# Patient Record
Sex: Female | Born: 1980 | Race: White | Hispanic: No | Smoking: Current every day smoker
Health system: Southern US, Community
[De-identification: ages and names within clinical notes are randomized; demographics above are authoritative.]

## PROBLEM LIST (undated history)

## (undated) DIAGNOSIS — F319 Bipolar disorder, unspecified: Secondary | ICD-10-CM

---

## 2004-11-12 ENCOUNTER — Emergency Department: Payer: Self-pay | Admitting: General Practice

## 2005-03-13 ENCOUNTER — Emergency Department: Payer: Self-pay | Admitting: Emergency Medicine

## 2005-05-31 ENCOUNTER — Emergency Department: Payer: Self-pay | Admitting: Emergency Medicine

## 2008-01-31 ENCOUNTER — Ambulatory Visit: Payer: Self-pay | Admitting: Family Medicine

## 2008-03-01 ENCOUNTER — Ambulatory Visit: Payer: Self-pay | Admitting: Family Medicine

## 2008-04-24 ENCOUNTER — Ambulatory Visit: Payer: Self-pay | Admitting: Internal Medicine

## 2008-05-31 ENCOUNTER — Ambulatory Visit: Payer: Self-pay | Admitting: Internal Medicine

## 2008-06-02 ENCOUNTER — Emergency Department: Payer: Self-pay | Admitting: Emergency Medicine

## 2008-06-18 DIAGNOSIS — G43909 Migraine, unspecified, not intractable, without status migrainosus: Secondary | ICD-10-CM | POA: Insufficient documentation

## 2009-01-17 ENCOUNTER — Emergency Department: Payer: Self-pay | Admitting: Emergency Medicine

## 2012-10-25 DIAGNOSIS — F17200 Nicotine dependence, unspecified, uncomplicated: Secondary | ICD-10-CM | POA: Diagnosis present

## 2013-12-27 DIAGNOSIS — F329 Major depressive disorder, single episode, unspecified: Secondary | ICD-10-CM | POA: Insufficient documentation

## 2013-12-27 DIAGNOSIS — F32A Depression, unspecified: Secondary | ICD-10-CM | POA: Insufficient documentation

## 2014-01-14 LAB — URINALYSIS, COMPLETE
Bilirubin,UR: NEGATIVE
Glucose,UR: NEGATIVE mg/dL (ref 0–75)
Nitrite: NEGATIVE
Ph: 5 (ref 4.5–8.0)
Protein: NEGATIVE
SPECIFIC GRAVITY: 1.011 (ref 1.003–1.030)
Squamous Epithelial: 37
WBC UR: 7 /HPF (ref 0–5)

## 2014-01-14 LAB — COMPREHENSIVE METABOLIC PANEL
ALBUMIN: 4.2 g/dL (ref 3.4–5.0)
ALK PHOS: 77 U/L
ALT: 31 U/L (ref 12–78)
Anion Gap: 6 — ABNORMAL LOW (ref 7–16)
BUN: 13 mg/dL (ref 7–18)
Bilirubin,Total: 0.5 mg/dL (ref 0.2–1.0)
CALCIUM: 9.3 mg/dL (ref 8.5–10.1)
CHLORIDE: 105 mmol/L (ref 98–107)
Co2: 28 mmol/L (ref 21–32)
Creatinine: 0.76 mg/dL (ref 0.60–1.30)
EGFR (African American): >60
EGFR (Non-African Amer.): >60
GLUCOSE: 114 mg/dL — AB (ref 65–99)
OSMOLALITY: 279 (ref 275–301)
POTASSIUM: 3 mmol/L — AB (ref 3.5–5.1)
SGOT(AST): 13 U/L — ABNORMAL LOW (ref 15–37)
SODIUM: 139 mmol/L (ref 136–145)
Total Protein: 8.3 g/dL — ABNORMAL HIGH (ref 6.4–8.2)

## 2014-01-14 LAB — CBC
HCT: 44.8 % (ref 35.0–47.0)
HGB: 14.8 g/dL (ref 12.0–16.0)
MCH: 28.9 pg (ref 26.0–34.0)
MCHC: 33 g/dL (ref 32.0–36.0)
MCV: 88 fL (ref 80–100)
Platelet: 365 10*3/uL (ref 150–440)
RBC: 5.11 10*6/uL (ref 3.80–5.20)
RDW: 12.2 % (ref 11.5–14.5)
WBC: 11.9 10*3/uL — AB (ref 3.6–11.0)

## 2014-01-14 LAB — ETHANOL
Ethanol %: <0.003 % (ref 0.000–0.080)
Ethanol: <3 mg/dL

## 2014-01-14 LAB — SALICYLATE LEVEL: SALICYLATES, SERUM: 3.7 mg/dL — AB

## 2014-01-14 LAB — ACETAMINOPHEN LEVEL

## 2014-01-15 ENCOUNTER — Inpatient Hospital Stay: Payer: Self-pay | Admitting: Psychiatry

## 2014-01-15 LAB — DRUG SCREEN, URINE
Amphetamines, Ur Screen: NEGATIVE (ref ?–1000)
Barbiturates, Ur Screen: NEGATIVE (ref ?–200)
Benzodiazepine, Ur Scrn: NEGATIVE (ref ?–200)
Cannabinoid 50 Ng, Ur ~~LOC~~: POSITIVE (ref ?–50)
Cocaine Metabolite,Ur ~~LOC~~: NEGATIVE (ref ?–300)
MDMA (ECSTASY) UR SCREEN: NEGATIVE (ref ?–500)
Methadone, Ur Screen: NEGATIVE (ref ?–300)
OPIATE, UR SCREEN: NEGATIVE (ref ?–300)
PHENCYCLIDINE (PCP) UR S: NEGATIVE (ref ?–25)
Tricyclic, Ur Screen: NEGATIVE (ref ?–1000)

## 2014-01-15 LAB — TSH: Thyroid Stimulating Horm: 3.91 u[IU]/mL

## 2014-01-15 LAB — TROPONIN I: Troponin-I: <0.02 ng/mL

## 2015-03-02 NOTE — H&P (Signed)
PATIENT NAME:  Shannon Patel, Shannon Patel MR#:  [id number] DATE OF BIRTH:  [birth year]  DATE OF ADMISSION:  01/15/2014  REFERRING PHYSICIAN: Emergency Room M.D.   ATTENDING PHYSICIAN: Kristine Linea, M.D.   IDENTIFYING DATA: Shannon Patel is a 34 year old female with no past psychiatric history.   CHIEF COMPLAINT: "I'm not crazy."   HISTORY OF PRESENT ILLNESS: Shannon Patel is a very poor historian, psychotic and disorganized and difficult to interview. She was brought to the hospital by her family. Her mother felt that the patient became too paranoid to function. The patient felt that there are strangers coming to the house, was running away from them. This change of behavior has been pretty sudden and the patient has been paranoid for about a week or so. In the hospital, the patient complained mostly of physical complaints, headache, chest pain. She felt that she is physically ill. She reports that she was frightened at home and the family was trying to get her. She was not feeling safe. The patient is not forthcoming at all.  One thing she did tell me was that everything started on October 8th. My understanding is that on that day last year, her fiance committed suicide. The patient was forced to move in with her parents. She is rather tearful telling me that. She is not forthcoming with any details. She appears hyperreligious. She thinks that she had some unusual religious experience lately. She was observed hallucinating, talking to herself and laughing inappropriately. She denies illicit substance use but is positive for cannabinoids. She denies auditory hallucinations on direct questioning.   PAST PSYCHIATRIC HISTORY: None reported. No suicide attempts. No past hospitalizations or treatments.   FAMILY PSYCHIATRIC HISTORY: None reported.   PAST MEDICAL HISTORY: None.   ALLERGIES: No known drug allergies.   MEDICATIONS ON ADMISSION: Amoxicillin 500 mg every 8   hours.   SOCIAL HISTORY: She lives with her  parents. She is currently unemployed. She lost her boyfriend several months ago.   REVIEW OF SYSTEMS: CONSTITUTIONAL: No fevers or chills. No weight changes.  EYES: No double or blurred vision.  ENT: No hearing loss.  RESPIRATORY: No shortness of breath or cough.  CARDIOVASCULAR: No chest pain or orthopnea.  GASTROINTESTINAL: No abdominal pain, nausea, vomiting or diarrhea.  GENITOURINARY: No incontinence or frequency.  ENDOCRINE: No heat or cold intolerance.  LYMPHATIC: No anemia or easy bruising.  INTEGUMENTARY: No acne or rash.  MUSCULOSKELETAL: No muscle or joint pain.  NEUROLOGIC: No tingling or weakness.  PSYCHIATRIC: See history of present illness for details.   PHYSICAL EXAMINATION: VITAL SIGNS: Blood pressure 110/74, pulse 80, respirations 20, temperature 97.8.  GENERAL: This is a slender young female in no acute distress.  HEENT: The pupils are equal, round and reactive to light. Sclerae are anicteric.  NECK: Supple. No thyromegaly.  LUNGS: Clear to auscultation. No dullness to percussion.  HEART: Regular rhythm and rate. No murmurs, rubs or gallops.  ABDOMEN: Soft, nontender, nondistended. Positive bowel sounds.  MUSCULOSKELETAL: Normal muscle strength in all extremities.  SKIN: No rashes or bruises.  LYMPHATIC: No cervical adenopathy.  NEUROLOGIC: Cranial nerves II through XII are intact.   LABORATORY DATA: Chemistries are within normal limits except for a low potassium of 3. Blood alcohol level is zero. LFTs within normal limits. TSH 3.91. Urine tox screen positive for cannabinoids. CBC within normal limits except for white blood count of 11.9. Urinalysis with trace of leukocyte esterase and 7 white cells per field. Serum acetaminophen less than 2. Serum  salicylates 3.7. EKG normal sinus rhythm with sinus arrhythmia, normal EKG.   MENTAL STATUS EXAMINATION ON ADMISSION: The patient is alert and oriented to person, place, time and somewhat to situation. She is pleasant and  polite but marginally cooperative, distrustful. She maintains good eye contact. Her speech is halting. Her mood is fine with irritable affect. Thought process is influenced by her delusions and possibly auditory hallucinations. She is logical with her own logic. She denied thoughts of hurting herself or others. She is clearly paranoid and delusional. She denies hallucinations but was observed attending to internal stimuli and giggling inappropriately. She believes in messages and coincidence. For example, when I walked to her room she felt that this was because she put a folded towel on the window sill. Her cognition is grossly intact but difficult to assess. She seems intelligent with good fund of knowledge but terribly confused. Her insight and judgment are nonexistent.   SUICIDE RISK ASSESSMENT ON ADMISSION: This is a patient with new-onset mental illness. She is at increased risk of suicide, especially as she so far refuses medications.    DIAGNOSES: AXIS I: Major depressive disorder with psychotic features. Rule out bipolar affective disorder, hypomania.  Cannabis abuse.  AXIS II: Deferred.  AXIS III: Deferred.  AXIS IV: New-onset mental illness, recent loss, family conflict, access to care, poor insight.  AXIS V: Global assessment of functioning 25.   PLAN: The patient was admitted to [facility] for safety, stabilization and medication management. She was initially placed on suicide precautions and was closely monitored for any unsafe behaviors. She underwent full psychiatric and risk assessment. She received pharmacotherapy, individual and group psychotherapy, substance abuse counseling and support from therapeutic milieu.  1.  Psychosis. The patient refuses medication. We will offer Invega oral preparation with a plan to switch to [place] S[place] if necessary.  2.  Insomnia. We will offer trazodone for sleep.  3.  Upper respiratory infection.  She  was put on amoxicillin.  4.  Possible urinary tract infection. We will order urine culture.  5.  Disposition: She will be returning to home.     ____________________________ Braulio ConteJolanta B. Jennet MaduroPucilowska, MD jbp:cs D: 01/15/2014 18:23:39 ET T: 01/15/2014 19:04:53 ET JOB#: [id number]  cc: Jolanta B. Jennet MaduroPucilowska, MD, <Dictator> Shari ProwsJOLANTA B PUCILOWSKA MD ELECTRONICALLY SIGNED 01/20/2014 15:18

## 2015-05-27 ENCOUNTER — Emergency Department
Admission: EM | Admit: 2015-05-27 | Discharge: 2015-05-27 | Disposition: A | Discharge status: Home / Self Care | Payer: Medicaid Other | Attending: Emergency Medicine | Admitting: Emergency Medicine

## 2015-05-27 ENCOUNTER — Encounter: Payer: Self-pay | Admitting: Emergency Medicine

## 2015-05-27 DIAGNOSIS — J029 Acute pharyngitis, unspecified: Secondary | ICD-10-CM

## 2015-05-27 DIAGNOSIS — Z720 Tobacco use: Secondary | ICD-10-CM | POA: Diagnosis not present

## 2015-05-27 LAB — POCT RAPID STREP A: Streptococcus, Group A Screen (Direct): NEGATIVE

## 2015-05-27 MED ORDER — PENICILLIN V POTASSIUM 250 MG PO TABS: 250.0000 mg | ORAL_TABLET | Freq: Four times a day (QID) | ORAL | Status: DC

## 2015-05-27 MED ORDER — LIDOCAINE VISCOUS 2 % MT SOLN
15.0000 mL | Freq: Once | OROMUCOSAL | Status: AC
  Administered 2015-05-27: 15 mL via OROMUCOSAL
  Filled 2015-05-27: qty 15

## 2015-05-27 MED ORDER — DEXAMETHASONE SODIUM PHOSPHATE 10 MG/ML IJ SOLN
10.0000 mg | Freq: Once | INTRAMUSCULAR | Status: AC
  Administered 2015-05-27: 10 mg via INTRAMUSCULAR

## 2015-05-27 MED ORDER — DEXAMETHASONE SODIUM PHOSPHATE 10 MG/ML IJ SOLN
10.0000 mg | Freq: Once | INTRAMUSCULAR | Status: DC
  Filled 2015-05-27: qty 1

## 2015-05-27 NOTE — ED Notes (Signed)
Sore throat for couple of days..worse this am

## 2015-05-27 NOTE — Discharge Instructions (Signed)

## 2015-05-27 NOTE — ED Notes (Signed)
Fever , sore throat , left ear pain , swollen lymph nodes to left throat , " it feels like razor blades when I swallow

## 2015-05-27 NOTE — ED Provider Notes (Signed)
[facility] Emergency Department Provider Note  ____________________________________________  Time seen: On arrival  I have reviewed the triage vital signs and the nursing notes.   HISTORY  Chief Complaint Sore Throat    HPI Shannon Patel is a 34 y.o. female who presents with sore throat. This started approximately 3 days ago and has gotten worse. She reports pain when she swallows. No difficulty breathing. She denies fevers. She reports her child had strep throat recently. No rash. No cough or rhinorrhea. She reports the pain is burning and moderate to severe    History reviewed. No pertinent past medical history.  There are no active problems to display for this patient.   History reviewed. No pertinent past surgical history.  No current outpatient prescriptions on file.  Allergies Review of patient's allergies indicates no known allergies.  No family history on file.  Social History History  Substance Use Topics  . Smoking status: Current Every Day Smoker  . Smokeless tobacco: Not on file  . Alcohol Use: Yes    Review of Systems  Constitutional: Negative for fever. Eyes: Negative for discharge ENT: Positive for sore throat Respiratory: No difficult to breathing Genitourinary: Negative for dysuria. Musculoskeletal: Negative for back pain. Skin: Negative for rash. Neurological: Negative for headaches or focal weakness   ____________________________________________   PHYSICAL EXAM:  VITAL SIGNS: ED Triage Vitals  Enc Vitals Group     BP 05/27/15 1205 131/89 mmHg     Pulse Rate 05/27/15 1205 102     Resp 05/27/15 1205 20     Temp 05/27/15 1205 98.3 F (36.8 C)     Temp Source 05/27/15 1205 Oral     SpO2 05/27/15 1205 99 %     Weight 05/27/15 1205 150 lb (68.04 kg)     Height 05/27/15 1205  (1.6 m)     Head Cir --      Peak Flow --      Pain Score 05/27/15 1201 8     Pain Loc --      Pain Edu? --      Excl. in GC?  --      Constitutional: Alert and oriented. Well appearing and in no distress. Eyes: Conjunctivae are normal.  ENT   Head: Normocephalic and atraumatic.   Mouth/Throat: Mucous membranes are moist. Pharynx is erythematous and tonsils are swollen bilaterally. No purulent discharge Cardiovascular: Normal rate, regular rhythm.  Respiratory: Normal respiratory effort without tachypnea nor retractions.  Gastrointestinal: Soft and non-tender in all quadrants. No distention. There is no CVA tenderness. Musculoskeletal: Nontender with normal range of motion in all extremities. Neurologic:  Normal speech and language. No gross focal neurologic deficits are appreciated. Skin:  Skin is warm, dry and intact. No rash noted. Psychiatric: Mood and affect are normal. Patient exhibits appropriate insight and judgment.  ____________________________________________    LABS (pertinent positives/negatives)  Labs Reviewed  CULTURE, GROUP A STREP ([facility] ONLY)  POCT RAPID STREP A    ____________________________________________     ____________________________________________    RADIOLOGY I have personally reviewed any xrays that were ordered on this patient: *None  ____________________________________________   PROCEDURES  Procedure(s) performed: none   ____________________________________________   INITIAL IMPRESSION / ASSESSMENT AND PLAN / ED COURSE  Pertinent labs & imaging results that were available during my care of the patient were reviewed by me and considered in my medical decision making (see chart for details).  Exam is concerning for strep throat. We will treat  with penicillin and give Decadron IM for the discomfort and swelling  ____________________________________________   FINAL CLINICAL IMPRESSION(S) / ED DIAGNOSES  Final diagnoses:  Pharyngitis     Jene Everyobert Glenette Bookwalter, MD 05/27/15 [phone]

## 2015-05-30 LAB — CULTURE, GROUP A STREP (THRC)

## 2015-10-26 ENCOUNTER — Encounter: Payer: Self-pay | Admitting: *Deleted

## 2015-10-26 ENCOUNTER — Emergency Department
Admission: EM | Admit: 2015-10-26 | Discharge: 2015-10-28 | Disposition: A | Discharge status: Other Acute Inpatient Hospital | Payer: Medicaid Other | Attending: Emergency Medicine | Admitting: Emergency Medicine

## 2015-10-26 DIAGNOSIS — F1120 Opioid dependence, uncomplicated: Secondary | ICD-10-CM

## 2015-10-26 DIAGNOSIS — F1520 Other stimulant dependence, uncomplicated: Secondary | ICD-10-CM | POA: Diagnosis not present

## 2015-10-26 DIAGNOSIS — R45851 Suicidal ideations: Secondary | ICD-10-CM | POA: Insufficient documentation

## 2015-10-26 DIAGNOSIS — Z792 Long term (current) use of antibiotics: Secondary | ICD-10-CM | POA: Insufficient documentation

## 2015-10-26 DIAGNOSIS — F17200 Nicotine dependence, unspecified, uncomplicated: Secondary | ICD-10-CM | POA: Insufficient documentation

## 2015-10-26 DIAGNOSIS — F1994 Other psychoactive substance use, unspecified with psychoactive substance-induced mood disorder: Secondary | ICD-10-CM

## 2015-10-26 DIAGNOSIS — R000 Tachycardia, unspecified: Secondary | ICD-10-CM | POA: Insufficient documentation

## 2015-10-26 DIAGNOSIS — F10120 Alcohol abuse with intoxication, uncomplicated: Secondary | ICD-10-CM | POA: Diagnosis not present

## 2015-10-26 DIAGNOSIS — F10920 Alcohol use, unspecified with intoxication, uncomplicated: Secondary | ICD-10-CM

## 2015-10-26 DIAGNOSIS — F1020 Alcohol dependence, uncomplicated: Secondary | ICD-10-CM | POA: Diagnosis not present

## 2015-10-26 DIAGNOSIS — Z3202 Encounter for pregnancy test, result negative: Secondary | ICD-10-CM | POA: Insufficient documentation

## 2015-10-26 HISTORY — DX: Bipolar disorder, unspecified: F31.9

## 2015-10-26 LAB — COMPREHENSIVE METABOLIC PANEL
ALT: 16 U/L (ref 14–54)
AST: 17 U/L (ref 15–41)
Albumin: 4.5 g/dL (ref 3.5–5.0)
Alkaline Phosphatase: 72 U/L (ref 38–126)
Anion gap: 10 (ref 5–15)
BILIRUBIN TOTAL: 0.4 mg/dL (ref 0.3–1.2)
BUN: 11 mg/dL (ref 6–20)
CHLORIDE: 109 mmol/L (ref 101–111)
CO2: 25 mmol/L (ref 22–32)
Calcium: 9.2 mg/dL (ref 8.9–10.3)
Creatinine, Ser: 0.94 mg/dL (ref 0.44–1.00)
GFR calc Af Amer: >60 mL/min (ref >60)
GFR calc non Af Amer: >60 mL/min (ref >60)
GLUCOSE: 132 mg/dL — AB (ref 65–99)
POTASSIUM: 3.4 mmol/L — AB (ref 3.5–5.1)
SODIUM: 144 mmol/L (ref 135–145)
TOTAL PROTEIN: 8.3 g/dL — AB (ref 6.5–8.1)

## 2015-10-26 LAB — TSH: TSH: 2.664 u[IU]/mL (ref 0.350–4.500)

## 2015-10-26 LAB — CBC
HEMATOCRIT: 45.9 % (ref 35.0–47.0)
HEMOGLOBIN: 14.8 g/dL (ref 12.0–16.0)
MCH: 27.4 pg (ref 26.0–34.0)
MCHC: 32.2 g/dL (ref 32.0–36.0)
MCV: 85.1 fL (ref 80.0–100.0)
Platelets: 492 10*3/uL — ABNORMAL HIGH (ref 150–440)
RBC: 5.4 MIL/uL — AB (ref 3.80–5.20)
RDW: 13.9 % (ref 11.5–14.5)
WBC: 11.5 10*3/uL — ABNORMAL HIGH (ref 3.6–11.0)

## 2015-10-26 LAB — URINE DRUG SCREEN, QUALITATIVE (ARMC ONLY)
Amphetamines, Ur Screen: NOT DETECTED
Barbiturates, Ur Screen: NOT DETECTED
Benzodiazepine, Ur Scrn: NOT DETECTED
COCAINE METABOLITE, UR ~~LOC~~: NOT DETECTED
Cannabinoid 50 Ng, Ur ~~LOC~~: NOT DETECTED
MDMA (ECSTASY) UR SCREEN: NOT DETECTED
Methadone Scn, Ur: NOT DETECTED
Opiate, Ur Screen: NOT DETECTED
PHENCYCLIDINE (PCP) UR S: NOT DETECTED
Tricyclic, Ur Screen: NOT DETECTED

## 2015-10-26 LAB — ETHANOL: Alcohol, Ethyl (B): 249 mg/dL — ABNORMAL HIGH (ref <5)

## 2015-10-26 LAB — T4, FREE: Free T4: 1.01 ng/dL (ref 0.61–1.12)

## 2015-10-26 LAB — POCT PREGNANCY, URINE: Preg Test, Ur: NEGATIVE

## 2015-10-26 MED ORDER — ACETAMINOPHEN 500 MG PO TABS
ORAL_TABLET | ORAL | Status: AC
  Administered 2015-10-26: 500 mg via ORAL
  Filled 2015-10-26: qty 2

## 2015-10-26 MED ORDER — LORAZEPAM 2 MG PO TABS: 0.0000 mg | ORAL_TABLET | Freq: Two times a day (BID) | ORAL | Status: DC

## 2015-10-26 MED ORDER — VITAMIN B-1 100 MG PO TABS
ORAL_TABLET | ORAL | Status: AC
  Administered 2015-10-26: 100 mg via ORAL
  Filled 2015-10-26: qty 1

## 2015-10-26 MED ORDER — VITAMIN B-1 100 MG PO TABS
100.0000 mg | ORAL_TABLET | Freq: Every day | ORAL | Status: DC
Start: 2015-10-26 — End: 2015-10-28
  Administered 2015-10-26 – 2015-10-28 (×3): 100 mg via ORAL
  Filled 2015-10-26 (×2): qty 1

## 2015-10-26 MED ORDER — LORAZEPAM 1 MG PO TABS
1.0000 mg | ORAL_TABLET | Freq: Four times a day (QID) | ORAL | Status: DC | PRN
  Filled 2015-10-26: qty 1

## 2015-10-26 MED ORDER — LORAZEPAM 2 MG PO TABS
0.0000 mg | ORAL_TABLET | Freq: Four times a day (QID) | ORAL | Status: DC
  Administered 2015-10-26: 1 mg via ORAL

## 2015-10-26 MED ORDER — IBUPROFEN 600 MG PO TABS
600.0000 mg | ORAL_TABLET | Freq: Once | ORAL | Status: AC
  Administered 2015-10-26: 600 mg via ORAL

## 2015-10-26 MED ORDER — NICOTINE POLACRILEX 2 MG MT GUM
2.0000 mg | CHEWING_GUM | OROMUCOSAL | Status: DC | PRN
  Administered 2015-10-26 – 2015-10-28 (×4): 2 mg via ORAL
  Filled 2015-10-26 (×2): qty 1

## 2015-10-26 MED ORDER — LORAZEPAM 2 MG/ML IJ SOLN: 1.0000 mg | Freq: Four times a day (QID) | INTRAMUSCULAR | Status: DC | PRN

## 2015-10-26 MED ORDER — THIAMINE HCL 100 MG/ML IJ SOLN: 100.0000 mg | Freq: Every day | INTRAMUSCULAR | Status: DC

## 2015-10-26 MED ORDER — FOLIC ACID 1 MG PO TABS
1.0000 mg | ORAL_TABLET | Freq: Every day | ORAL | Status: DC
  Administered 2015-10-27 – 2015-10-28 (×2): 1 mg via ORAL
  Filled 2015-10-26 (×2): qty 1

## 2015-10-26 MED ORDER — ADULT MULTIVITAMIN W/MINERALS CH
1.0000 | ORAL_TABLET | Freq: Every day | ORAL | Status: DC
  Administered 2015-10-28: 1 via ORAL
  Filled 2015-10-26 (×4): qty 1

## 2015-10-26 MED ORDER — IBUPROFEN 600 MG PO TABS
ORAL_TABLET | ORAL | Status: AC
  Administered 2015-10-26: 600 mg via ORAL
  Filled 2015-10-26: qty 1

## 2015-10-26 MED ORDER — IBUPROFEN 800 MG PO TABS
400.0000 mg | ORAL_TABLET | Freq: Three times a day (TID) | ORAL | Status: DC | PRN
  Administered 2015-10-26: 400 mg via ORAL
  Filled 2015-10-26: qty 1

## 2015-10-26 MED ORDER — ONDANSETRON 4 MG PO TBDP
4.0000 mg | ORAL_TABLET | Freq: Once | ORAL | Status: AC
  Administered 2015-10-26: 4 mg via ORAL

## 2015-10-26 MED ORDER — ACETAMINOPHEN 500 MG PO TABS: 1000.0000 mg | ORAL_TABLET | Freq: Once | ORAL | Status: DC

## 2015-10-26 MED ORDER — ACETAMINOPHEN 500 MG PO TABS
500.0000 mg | ORAL_TABLET | Freq: Once | ORAL | Status: AC
  Administered 2015-10-26: 500 mg via ORAL

## 2015-10-26 MED ORDER — NICOTINE 14 MG/24HR TD PT24
14.0000 mg | MEDICATED_PATCH | Freq: Every day | TRANSDERMAL | Status: DC
  Filled 2015-10-26 (×2): qty 1

## 2015-10-26 MED ORDER — NICOTINE POLACRILEX 2 MG MT GUM
CHEWING_GUM | OROMUCOSAL | Status: AC
  Filled 2015-10-26: qty 1

## 2015-10-26 MED ORDER — ACETAMINOPHEN 325 MG PO TABS
650.0000 mg | ORAL_TABLET | Freq: Three times a day (TID) | ORAL | Status: DC | PRN
  Administered 2015-10-27 – 2015-10-28 (×2): 650 mg via ORAL
  Filled 2015-10-26 (×2): qty 2

## 2015-10-26 MED ORDER — ONDANSETRON 4 MG PO TBDP
ORAL_TABLET | ORAL | Status: AC
  Administered 2015-10-26: 4 mg via ORAL
  Filled 2015-10-26: qty 1

## 2015-10-26 NOTE — ED Notes (Signed)
Misty StanleyLisa, EDT made this RN aware of patient headache 10/10 pain. Will make MD aware.

## 2015-10-26 NOTE — ED Notes (Signed)
ED BHU PLACEMENT JUSTIFICATION Is the patient under IVC or is there intent for IVC: Yes.   Is the patient medically cleared: Yes.   Is there vacancy in the ED BHU: Yes.   Is the population mix appropriate for patient: Yes.   Is the patient awaiting placement in inpatient or outpatient setting: No. Has the patient had a psychiatric consult: No. Survey of unit performed for contraband, proper placement and condition of furniture, tampering with fixtures in bathroom, shower, and each patient room: Yes.  ; Findings: None APPEARANCE/BEHAVIOR calm and cooperative NEURO ASSESSMENT Orientation: time, place and person Hallucinations: No.None noted (Hallucinations) Speech: Normal Gait: normal RESPIRATORY ASSESSMENT Respirations equal bilaterally, no cough noted. No respiratory difficulty CARDIOVASCULAR ASSESSMENT regular rate and rhythm, S1, S2 normal, no murmur, click, rub or gallop and Skin warm dry intact regular rate and rhythm. GASTROINTESTINAL ASSESSMENT No gi distres. denies vomiting or nausea since receiving Zofran EXTREMITIES normal strength, tone, and muscle mass PLAN OF CARE Provide calm/safe environment. Vital signs assessed twice daily. ED BHU Assessment once each 12-hour shift. Collaborate with intake RN daily or as condition indicates. Assure the ED provider has rounded once each shift. Provide and encourage hygiene. Provide redirection as needed. Assess for escalating behavior; address immediately and inform ED provider.  Assess family dynamic and appropriateness for visitation as needed: Yes.  ; If necessary, describe findings: No known family dynamics Educate the patient/family about BHU procedures/visitation: Yes.  ; If necessary, describe findings:

## 2015-10-26 NOTE — ED Provider Notes (Signed)
[facility] Emergency Department Provider Note  ____________________________________________  Time seen: Approximately 211 AM  I have reviewed the triage vital signs and the nursing notes.   HISTORY  Chief Complaint Alcohol Intoxication    HPI Shannon Patel is a 34 y.o. female who was brought into the hospital today with suicidal ideation. The patient is sleepy not cooperating with arousal but according to the patient's family the patient got into an argument with someone about the fact that she did not have her children. The patient started drinking and made statements about wanting to kill herself if she could not get her children back. The patient's family member to her that she would never get her children back. The patient has a history of substance abuse and has lost her children to the state due to her substance abuse.The patient has been unable to get her children back due to continued substance abuse. The patient denies suicidal ideation but made statements previously about wanting to kill herself because she did not have her children. There is also concern the patient may have taken methamphetamine. He has no complaints at this time.   Past Medical History  Diagnosis Date  . Bipolar 1 disorder (HCC)     There are no active problems to display for this patient.   History reviewed. No pertinent past surgical history.  Current Outpatient Rx  Name  Route  Sig  Dispense  Refill  . penicillin v potassium (VEETID) 250 MG tablet   Oral   Take 1 tablet (250 mg total) by mouth 4 (four) times daily.   28 tablet   0     Allergies Review of patient's allergies indicates no known allergies.  History reviewed. No pertinent family history.  Social History Social History  Substance Use Topics  . Smoking status: Current Every Day Smoker  . Smokeless tobacco: None  . Alcohol Use: Yes    Review of Systems Constitutional: No fever/chills Eyes: No  visual changes. ENT: No sore throat. Cardiovascular: Denies chest pain. Respiratory: Denies shortness of breath. Gastrointestinal: No abdominal pain.  No nausea, no vomiting.  No diarrhea.  No constipation. Genitourinary: Negative for dysuria. Musculoskeletal: Negative for back pain. Skin: Negative for rash. Neurological: Negative for headaches, focal weakness or numbness. Psychiatric: Suicidal ideation and substance abuse  10-point ROS otherwise negative.  ____________________________________________   PHYSICAL EXAM:  VITAL SIGNS: ED Triage Vitals  Enc Vitals Group     BP 10/26/15 0013 134/84 mmHg     Pulse Rate 10/26/15 0013 130     Resp 10/26/15 0013 16     Temp --      Temp src --      SpO2 10/26/15 0013 98 %     Weight 10/26/15 0013 153 lb (69.4 kg)     Height 10/26/15 0013 5\' 3"  (1.6 m)     Head Cir --      Peak Flow --      Pain Score 10/26/15 0717 Asleep     Pain Loc --      Pain Edu? --      Excl. in GC? --     Constitutional: Somnolent, Well appearing and in no acute distress. Eyes: Conjunctivae are normal. PERRL. EOMI. Head: Atraumatic. Nose: No congestion/rhinnorhea. Mouth/Throat: Mucous membranes are moist.  Oropharynx non-erythematous. Cardiovascular: Tachycardia, regular rhythm. Grossly normal heart sounds.  Good peripheral circulation. Respiratory: Normal respiratory effort.  No retractions. Lungs CTAB. Gastrointestinal: Soft and nontender. No distention. Positive bowel  sounds Musculoskeletal: No lower extremity tenderness nor edema.  . Neurologic:  Normal speech and language. No gross focal neurologic deficits are appreciated. No gait instability. Skin:  Skin is warm, dry and intact.  Psychiatric: The patient was very agitated when she arrived in the hospital but was somnolent afterwards and sleeping  ____________________________________________   LABS (all labs ordered are listed, but only abnormal results are displayed)  Labs Reviewed   COMPREHENSIVE METABOLIC PANEL - Abnormal; Notable for the following:    Potassium 3.4 (*)    Glucose, Bld 132 (*)    Total Protein 8.3 (*)    All other components within normal limits  ETHANOL - Abnormal; Notable for the following:    Alcohol, Ethyl (B) 249 (*)    All other components within normal limits  CBC - Abnormal; Notable for the following:    WBC 11.5 (*)    RBC 5.40 (*)    Platelets 492 (*)    All other components within normal limits  URINE DRUG SCREEN, QUALITATIVE ([facility] ONLY)  POC URINE PREG, ED  POCT PREGNANCY, URINE   ____________________________________________  EKG  None ____________________________________________  RADIOLOGY  None ____________________________________________   PROCEDURES  Procedure(s) performed: None  Critical Care performed: No  ____________________________________________   INITIAL IMPRESSION / ASSESSMENT AND PLAN / ED COURSE  Pertinent labs & imaging results that were available during my care of the patient were reviewed by me and considered in my medical decision making (see chart for details).  This is a 34 year old female who was brought in with suicidal ideation and substance abuse. The patient has stressors in life that would cause her to feel suicidal. We'll have the patient evaluated by TTS as well as psych. The patient has been drinking so she is sleeping currently but we will await her arousal and then have her evaluated by TTS. ____________________________________________   FINAL CLINICAL IMPRESSION(S) / ED DIAGNOSES  Final diagnoses:  Suicidal ideation  Alcohol intoxication, uncomplicated (HCC)      Rebecka Apley, MD 10/26/15 [phone]

## 2015-10-26 NOTE — ED Notes (Signed)
Report received from Felicia RN.  

## 2015-10-26 NOTE — ED Notes (Signed)
Patient's multivitamin was not readily available when patient's other medications were administered and she asked that if she was able to go to sleep, we not wake her for the multivitamin.  Patient can be given the multivitamin in the am when she wakes up.

## 2015-10-26 NOTE — Consult Note (Signed)
[facility] Consult   Reason for Consult: Suicidal ideation Referring Physician: Loney Hering, MD  Patient Identification: Shannon Patel MRN:  [id number] Principal Diagnosis: <principal problem not specified> Diagnosis:  There are no active problems to display for this patient.   Total Time spent with patient: 1.5 hours  Subjective:   Shannon Patel is a 34 y.o. female patient who presents to [facility] with suicidal ideation after getting into an argument with someone about not having her children.  The pt made statements about wanting to kill herself if she was unable to have her children returned to her.  Patient has a h/o substance abuse and lost her children as a result. Pt denied SI to Dr. Dahlia Client but made statements previously about wanting to kill herself because she did not have her children.  Nursing notes reviewed. Patient presented to the ED after drinking a 5th of Fireball. Pt denies taking meth. Pt's mother called police after the pt left her home and returned 1 hour later behaving differently (cussing and crying, reporting she would do whatever it took to get her daughter back. Pts daughter is with social services). Pt's mood improved while here. This morning reporting to be in a good mood. Pt was pleasant and cooperative.   [facility] Assessment counselor notes shared the pt is tearful, hopeless, guilty, irritability, and despondent. Patient reports an unwillingness to live without her children  Today on interview, the pt shared, "I drank alcohol last night and being stupid." Pt discussed her most recent stressors as noted above. Pt discussed no longer having her children with her as they are in foster care due to her drug use. The pt is permitted to have contact with her 2 sons (aged 20 and 20 years) but recently she is unable to have contact with her 61yo daughter. Her top fear is her daughter will be "adopted away."   The pt reviewed how she will be able to get her  children back which includes 1 year of rehab and following the other procedures/processes. Her main addiction is to pills including Vicodin, percocets, tramadol and methadone. She started using pills about 8 years ago. She has been in rehab before where she was successful with suboxone use. She did not use pills from May 2016 until September 2016. Intermittently, she has used methamphetamine between May and November 2016 (last use was in November 2016). She states she wants to stop using all substances so she can get her kids back. She denies cravings for drugs but shared she had decreased motivation.  She endorses feeling depressed with sleep, guilt, decreased energy, decreased appetite and psychomotor retardation. She endorses feeling hopeless, helpless and worthless. She denies a h/o mania and she current denies SI, HI and AVH.   She also notes she started fluoxetine 40m po Qam this past Tuesday prescribed by her PCP. The pt is willing to restart this medication. She denies medication side effects. Pt endorses a possible h/o tremors but denies a h/o seizures due to alcohol withdrawal.   Past Psychiatric History: Dx: depression, anxiety Psychiatrist: Denies but [facility] Therapist: Denies but [facility] Hospitalizations: Yes, related to substance abuse treatment.  ECT: Denies Suicide attempt/Self-harm: Denies Homicide attempts/harming others: Denies Previous Medication Trials: fluoxetine   Risk to Self: Suicidal Ideation: No-Not Currently/Within Last 6 Months Suicidal Intent: No-Not Currently/Within Last 6 Months Is patient at risk  for suicide?: No Suicidal Plan?: No-Not Currently/Within Last 6 Months Access to Means: No What has been your use of drugs/alcohol within the last 12 months?: alcohol How many times?: 0 Triggers for Past Attempts: Family contact, Other personal contacts Intentional Self  Injurious Behavior: None Risk to Others: Homicidal Ideation: No-Not Currently/Within Last 6 Months Thoughts of Harm to Others: No-Not Currently Present/Within Last 6 Months Current Homicidal Intent: No-Not Currently/Within Last 6 Months Current Homicidal Plan: No-Not Currently/Within Last 6 Months Access to Homicidal Means: No History of harm to others?: No Assessment of Violence: None Noted Does patient have access to weapons?: No Criminal Charges Pending?: No Does patient have a court date: No Prior Inpatient Therapy: Prior Inpatient Therapy: Yes Prior Therapy Dates: unknown Prior Therapy Facilty/Provider(s): [facility] Reason for Treatment: MH/SA Prior Outpatient Therapy: Prior Outpatient Therapy: Yes Prior Therapy Dates: unknown Prior Therapy Facilty/Provider(s): [facility] Reason for Treatment: SA Does patient have an ACCT team?: No Does patient have Intensive In-House Services?  : No Does patient have [facility] services? : No Does patient have P4CC services?: No  Past Medical History:  Past Medical History  Diagnosis Date  . Bipolar 1 disorder ([place])    History reviewed. No pertinent past surgical history. Family History: History reviewed. No pertinent family history.  Family Psychiatric  History:  Depression: mother Anxiety: Denies Bipolar disorder: mother??? Schizophrenia: Denies Panic disorder: Denies  Substance abuse: Bother has been in rehab now for several years.  Alcohol: Father, died of cirrhosis of the liver.   Suicide: Denies    Social History:  History  Alcohol Use  . Yes     History  Drug Use Not on file    Social History   Social History  . Marital Status: Married    Spouse Name: N/A  . Number of Children: N/A  . Years of Education: N/A   Social History Main Topics  . Smoking status: Current Every Day Smoker  . Smokeless tobacco: None  . Alcohol Use: Yes  . Drug Use: None  . Sexual Activity: Not Asked   Other Topics Concern  . None    Social History Narrative   Additional Social History:    Pain Medications: see chart Prescriptions: see chart Over the Counter: see chart History of alcohol / drug use?: Yes Longest period of sobriety (when/how long): 3 months Negative Consequences of Use: Museum/gallery curator, Scientist, research (physical sciences), Personal relationships, Work / School Withdrawal Symptoms:  (denies) Name of Substance 1: alcohol 1 - Age of First Use: 16 1 - Amount (size/oz): varies 1 - Frequency: varies 1 - Last Use / Amount: 10/25/2015   Allergies:  No Known Allergies  Labs:  Results for orders placed or performed during the hospital encounter of 10/26/15 (from the past 48 hour(s))  Comprehensive metabolic panel     Status: Abnormal   Collection Time: 10/26/15 12:19 AM  Result Value Ref Range   Sodium 144 135 - 145 mmol/L   Potassium 3.4 (L) 3.5 - 5.1 mmol/L   Chloride 109 101 - 111 mmol/L   CO2 25 22 - 32 mmol/L   Glucose, Bld 132 (H) 65 - 99 mg/dL   BUN 11 6 - 20 mg/dL   Creatinine, Ser 0.94 0.44 - 1.00 mg/dL   Calcium 9.2 8.9 - 10.3 mg/dL   Total Protein 8.3 (H) 6.5 - 8.1 g/dL   Albumin 4.5 3.5 - 5.0 g/dL   AST 17 15 - 41 U/L   ALT 16 14 - 54 U/L   Alkaline  Phosphatase 72 38 - 126 U/L   Total Bilirubin 0.4 0.3 - 1.2 mg/dL   GFR calc non Af Amer >60 >60 mL/min   GFR calc Af Amer >60 >60 mL/min    Comment: (NOTE) The eGFR has been calculated using the CKD EPI equation. This calculation has not been validated in all clinical situations. eGFR's persistently <60 mL/min signify possible Chronic Kidney Disease.    Anion gap 10 5 - 15  Ethanol (ETOH)     Status: Abnormal   Collection Time: 10/26/15 12:19 AM  Result Value Ref Range   Alcohol, Ethyl (B) 249 (H) <5 mg/dL    Comment:        LOWEST DETECTABLE LIMIT FOR SERUM ALCOHOL IS 5 mg/dL FOR MEDICAL PURPOSES ONLY   CBC     Status: Abnormal   Collection Time: 10/26/15 12:19 AM  Result Value Ref Range   WBC 11.5 (H) 3.6 - 11.0 K/uL   RBC 5.40 (H) 3.80 - 5.20 MIL/uL    Hemoglobin 14.8 12.0 - 16.0 g/dL   HCT 45.9 35.0 - 47.0 %   MCV 85.1 80.0 - 100.0 fL   MCH 27.4 26.0 - 34.0 pg   MCHC 32.2 32.0 - 36.0 g/dL   RDW 13.9 11.5 - 14.5 %   Platelets 492 (H) 150 - 440 K/uL  Urine Drug Screen, Qualitative ([facility] only)     Status: None   Collection Time: 10/26/15 12:55 AM  Result Value Ref Range   Tricyclic, Ur Screen NONE DETECTED NONE DETECTED   Amphetamines, Ur Screen NONE DETECTED NONE DETECTED   MDMA (Ecstasy)Ur Screen NONE DETECTED NONE DETECTED   Cocaine Metabolite,Ur [place] NONE DETECTED NONE DETECTED   Opiate, Ur Screen NONE DETECTED NONE DETECTED   Phencyclidine (PCP) Ur S NONE DETECTED NONE DETECTED   Cannabinoid 50 Ng, Ur [place] NONE DETECTED NONE DETECTED   Barbiturates, Ur Screen NONE DETECTED NONE DETECTED   Benzodiazepine, Ur Scrn NONE DETECTED NONE DETECTED   Methadone Scn, Ur NONE DETECTED NONE DETECTED    Comment: (NOTE) 469  Tricyclics, urine               Cutoff 1000 ng/mL 200  Amphetamines, urine             Cutoff 1000 ng/mL 300  MDMA (Ecstasy), urine           Cutoff 500 ng/mL 400  Cocaine Metabolite, urine       Cutoff 300 ng/mL 500  Opiate, urine                   Cutoff 300 ng/mL 600  Phencyclidine (PCP), urine      Cutoff 25 ng/mL 700  Cannabinoid, urine              Cutoff 50 ng/mL 800  Barbiturates, urine             Cutoff 200 ng/mL 900  Benzodiazepine, urine           Cutoff 200 ng/mL 1000 Methadone, urine                Cutoff 300 ng/mL 1100 1200 The urine drug screen provides only a preliminary, unconfirmed 1300 analytical test result and should not be used for non-medical 1400 purposes. Clinical consideration and professional judgment should 1500 be applied to any positive drug screen result due to possible 1600 interfering substances. A more specific alternate chemical method 1700 must be used in order to obtain a confirmed analytical  result.  1800 Gas chromato graphy / mass spectrometry (GC/MS) is the preferred 1900  confirmatory method.   Pregnancy, urine POC     Status: None   Collection Time: 10/26/15  3:49 AM  Result Value Ref Range   Preg Test, Ur NEGATIVE NEGATIVE    Comment:        THE SENSITIVITY OF THIS METHODOLOGY IS >24 mIU/mL     No current facility-administered medications for this encounter.   Current Outpatient Prescriptions  Medication Sig Dispense Refill  . penicillin v potassium (VEETID) 250 MG tablet Take 1 tablet (250 mg total) by mouth 4 (four) times daily. 28 tablet 0    Musculoskeletal: Strength & Muscle Tone: within normal limits Gait & Station: normal Patient leans: N/A  Psychiatric Specialty Exam: Review of Systems  Psychiatric/Behavioral: Positive for depression and substance abuse. Negative for suicidal ideas, hallucinations and memory loss. The patient has insomnia. The patient is not nervous/anxious.   All other systems reviewed and are negative.   Blood pressure 105/54, pulse 76, resp. rate 18, height 5' 3"  (1.6 m), weight 69.4 kg (153 lb), SpO2 98 %.Body mass index is 27.11 kg/(m^2).  General Appearance: Fairly Groomed  Engineer, water::  Good  Speech:  Clear and Coherent and Normal Rate  Volume:  Normal  Mood:  Dysphoric  Affect:  Depressed  Thought Process:  Coherent, Goal Directed, Intact, Linear and Logical  Orientation:  Full (Time, Place, and Person)  Thought Content:  Negative  Suicidal Thoughts:  No  Homicidal Thoughts:  No  Memory:  Immediate;   Good Recent;   Good Remote;   Good  Judgement:  Poor  Insight:  Fair  Psychomotor Activity:  Normal  Concentration:  Good  Recall:  Good  Fund of Knowledge:Good  Language: Good  Akathisia:  Negative  Handed:  Right  AIMS (if indicated):     Assets:  Communication Skills Desire for Improvement Housing Physical Health Resilience Social Support  ADL's:  Intact  Cognition: WNL  Sleep:      Treatment Plan Summary: Medication management  Disposition: Recommend psychiatric Inpatient admission  when medically cleared.   1. Continue IVC and seek inpatient placement.  2. Will restart fluoxetine 45m po Qam for depression.  3. Start nicotine patch 125mper 24 hours and nicotine gum for nicotine withdrawal.  4. R/B/Se discussed including but not limited to any and black box warnings, mood derangements, GI upset, insomnia.  5. Will check TSH and FT4.  6. Will start CIWA for alcohol withdrawal as a primary preventive measure.    McDonita Brooks2/17/2016 12:15 PM

## 2015-10-26 NOTE — ED Notes (Signed)
Denies any SI/HI at this time. Patient states I am in a good mind right now, I've just had a lot of things going on.  Pt pleasant and cooperative.  Ate some breakfast. Given crackers and sprite.  Explained to patient she would be moving to [facility] at some point and is waiting for psychiatrist to evaluate.

## 2015-10-26 NOTE — ED Notes (Signed)

## 2015-10-26 NOTE — ED Notes (Signed)
Meal tray given 

## 2015-10-26 NOTE — BH Assessment (Signed)
Assessment Note  Shannon Patel is an 34 y.o. female. Patient was brought into the ED because of intoxication and threats of suicide.  According to previous notes the patient made threats to kill herself if she is unable to get her children back from DSS. Patient currently denies SI/HI, hallucinations, and other self injurious behaviors. Patient is tearful, hopeless, guilty, irritability, and despondent.  Patient reports an unwillingness to live without her children.  Patient reports that the case worker with DSS has recommended long-term treatment as an options for her children to return to her custody but the patient doubtful about getting all her children back.    Patient reports drinking a fifth of fireball whiskey prior to this ED admission.  Patient reports the longest abstinence is 3 months.    Disposition pending.    Diagnosis: Substance Induced Mood disorder, Alcohol use, moderate  Past Medical History:  Past Medical History  Diagnosis Date  . Bipolar 1 disorder ([facility])     History reviewed. No pertinent past surgical history.  Family History: History reviewed. No pertinent family history.  Social History:  reports that she has been smoking.  She does not have any smokeless tobacco history on file. She reports that she drinks alcohol. Her drug history is not on file.  Additional Social History:  Alcohol / Drug Use Pain Medications: see chart Prescriptions: see chart Over the Counter: see chart History of alcohol / drug use?: Yes Longest period of sobriety (when/how long): 3 months Negative Consequences of Use: Financial, Legal, Personal relationships, Work / School Withdrawal Symptoms:  (denies) Substance #1 Name of Substance 1: alcohol 1 - Age of First Use: 16 1 - Amount (size/oz): varies 1 - Frequency: varies 1 - Last Use / Amount: 10/25/2015  CIWA: CIWA-Ar BP: (!) 105/54 mmHg Pulse Rate: 76 Nausea and Vomiting: no nausea and no vomiting Tactile Disturbances:  none Tremor: no tremor Auditory Disturbances: not present Paroxysmal Sweats: no sweat visible Visual Disturbances: not present Anxiety: no anxiety, at ease Headache, Fullness in Head: none present Agitation: normal activity Orientation and Clouding of Sensorium: oriented and can do serial additions CIWA-Ar Total: 0 COWS:    Allergies: No Known Allergies  Home Medications:  (Not in a hospital admission)  OB/GYN Status:  No LMP recorded.  General Assessment Data [facility] Assessment: In system Is this a Tele or Face-to-Face Assessment?: Face-to-Face Is this an Initial Assessment or a Re-assessment for this encounter?: Initial Assessment Marital status: Single Maiden name: Dixion Is patient pregnant?: No Pregnancy Status: No Living Arrangements: Parent Can pt return to current living arrangement?: Yes Admission Status: Involuntary Is patient capable of signing voluntary admission?: No Referral Source: Self/Family/Friend  Medical Screening Exam [facility] ONLY) Medical Exam completed: Yes  Crisis Care Plan Living Arrangements: Parent Name of Psychiatrist: [facility] Name of Therapist: [facility]  Education Status Is patient currently in school?: No Current Grade: na Highest grade of school patient has completed: 10 Name of school: na Contact person: na  Risk to self with the past 6 months Suicidal Ideation: No-Not Currently/Within Last 6 Months Has patient been a risk to self within the past 6 months prior to admission? : Yes Suicidal Intent: No-Not Currently/Within Last 6 Months Has patient had any suicidal intent within the past 6 months prior to admission? : No Is patient at risk for suicide?: No Suicidal Plan?: No-Not Currently/Within Last 6 Months Has patient had any suicidal plan within the past 6 months prior  to admission? : No Access to Means: No What has been your use of drugs/alcohol within the last 12 months?: alcohol Previous  Attempts/Gestures: No How many times?: 0 Triggers for Past Attempts: Family contact, Other personal contacts Intentional Self Injurious Behavior: None Family Suicide History: No Recent stressful life event(s): Conflict (Comment), Loss (Comment), Financial Problems, Trauma (Comment) Persecutory voices/beliefs?: No Depression: Yes Depression Symptoms: Tearfulness, Guilt, Loss of interest in usual pleasures, Feeling worthless/self pity, Feeling angry/irritable (hopelessness) Substance abuse history and/or treatment for substance abuse?: Yes  Risk to Others within the past 6 months Homicidal Ideation: No-Not Currently/Within Last 6 Months Does patient have any lifetime risk of violence toward others beyond the six months prior to admission? : No Thoughts of Harm to Others: No-Not Currently Present/Within Last 6 Months Current Homicidal Intent: No-Not Currently/Within Last 6 Months Current Homicidal Plan: No-Not Currently/Within Last 6 Months Access to Homicidal Means: No History of harm to others?: No Assessment of Violence: None Noted Does patient have access to weapons?: No Criminal Charges Pending?: No Does patient have a court date: No Is patient on probation?: No  Psychosis Hallucinations: None noted Delusions: None noted  Mental Status Report Appearance/Hygiene: In hospital gown Eye Contact: Fair Motor Activity: Freedom of movement Speech: Logical/coherent Level of Consciousness: Alert Mood: Depressed Affect: Depressed Anxiety Level: Minimal Thought Processes: Relevant Judgement: Unimpaired Orientation: Person, Place, Time, Situation Obsessive Compulsive Thoughts/Behaviors: None  Cognitive Functioning Concentration: Fair Memory: Recent Intact, Remote Intact IQ: Average Insight: Fair Impulse Control: Fair Appetite: Fair Weight Loss: 0 Weight Gain: 0 Sleep: Decreased Total Hours of Sleep: 5 Vegetative Symptoms: None  ADLScreening [facility] Assessment  Services) Patient's cognitive ability adequate to safely complete daily activities?: Yes Patient able to express need for assistance with ADLs?: Yes Independently performs ADLs?: Yes (appropriate for developmental age)  Prior Inpatient Therapy Prior Inpatient Therapy: Yes Prior Therapy Dates: unknown Prior Therapy Facilty/Provider(s): [facility] Reason for Treatment: MH/SA  Prior Outpatient Therapy Prior Outpatient Therapy: Yes Prior Therapy Dates: unknown Prior Therapy Facilty/Provider(s): [facility] Reason for Treatment: SA Does patient have an ACCT team?: No Does patient have Intensive In-House Services?  : No Does patient have [facility] services? : No Does patient have P4CC services?: No  ADL Screening (condition at time of admission) Patient's cognitive ability adequate to safely complete daily activities?: Yes Patient able to express need for assistance with ADLs?: Yes Independently performs ADLs?: Yes (appropriate for developmental age)       Abuse/Neglect Assessment (Assessment to be complete while patient is alone) Physical Abuse: Denies Verbal Abuse: Denies Sexual Abuse: Denies Exploitation of patient/patient's resources: Denies Self-Neglect: Denies Values / Beliefs Cultural Requests During Hospitalization: None Spiritual Requests During Hospitalization: None Consults Spiritual Care Consult Needed: No Social Work Consult Needed: No Merchant navy officer (For Healthcare) Does patient have an advance directive?: No Would patient like information on creating an advanced directive?: No - patient declined information    Additional Information 1:1 In Past 12 Months?: No CIRT Risk: No Elopement Risk: No Does patient have medical clearance?: Yes     Disposition:  Disposition Initial Assessment Completed for this Encounter: Yes Disposition of Patient: Other dispositions (Pending) Other disposition(s): Other (Comment) (Pending)  On Site Evaluation by:   Reviewed  with Physician:    Maryelizabeth Rowan A 10/26/2015 11:12 AM

## 2015-10-26 NOTE — ED Notes (Addendum)
Pt arrived to Ed with sheriff after drinking a reported 5th of Fireball. Sheriff reports pt could have taken meth but pt denies. Pts mother called police after pt left house and 1 hour later returned acting differently (cussing and crying, reporting she would do whatever it took to get her daughter back. Pts daughter is with social services). Pt denies SI/HI.

## 2015-10-26 NOTE — ED Notes (Signed)
Patient tolerated medication administration well. MD made aware. No new orders.

## 2015-10-26 NOTE — ED Notes (Signed)
Pt given lunch tray.

## 2015-10-26 NOTE — ED Notes (Signed)
Pt resting quietly in bed at this time.

## 2015-10-26 NOTE — ED Notes (Signed)
Received new orders from MD for nausea/vomiting sx. Will complete accordingly.

## 2015-10-26 NOTE — ED Notes (Addendum)
Pt has 5 silver colored rings, 1 silver colored bracelet, 1 pair silver colored cross earrings, and a hair tie placed in a clear cup, and placed in pt belonging bag, given to [employer] nurse for lockup.

## 2015-10-26 NOTE — ED Notes (Signed)
Patient noted actively vomiting at this time. Will make MD aware.

## 2015-10-26 NOTE — ED Notes (Addendum)
Pt alert, appropriate, cooperative. Up, eating meal. Will continue to monitor patient.

## 2015-10-26 NOTE — ED Notes (Signed)
Pt woke up long enough for vital signs to be taken and went back to sleep. Denies any needs at this time.

## 2015-10-26 NOTE — ED Notes (Signed)
Pt showered. Pt informed that normally showers are done in the morning but because patient arrived late was told she could shower.

## 2015-10-26 NOTE — ED Notes (Signed)
Dr. Jenne CampusMcqueen at bedside.

## 2015-10-26 NOTE — ED Notes (Signed)
Spoke with MD, new verbal orders received. Will complete accordingly.

## 2015-10-27 DIAGNOSIS — F1994 Other psychoactive substance use, unspecified with psychoactive substance-induced mood disorder: Secondary | ICD-10-CM | POA: Diagnosis not present

## 2015-10-27 DIAGNOSIS — F1020 Alcohol dependence, uncomplicated: Secondary | ICD-10-CM | POA: Diagnosis not present

## 2015-10-27 DIAGNOSIS — F1120 Opioid dependence, uncomplicated: Secondary | ICD-10-CM | POA: Diagnosis not present

## 2015-10-27 DIAGNOSIS — F1520 Other stimulant dependence, uncomplicated: Secondary | ICD-10-CM | POA: Diagnosis not present

## 2015-10-27 MED ORDER — FLUOXETINE HCL 20 MG PO CAPS
20.0000 mg | ORAL_CAPSULE | Freq: Every day | ORAL | Status: DC
  Administered 2015-10-27 – 2015-10-28 (×2): 20 mg via ORAL
  Filled 2015-10-27: qty 1

## 2015-10-27 MED ORDER — TRAZODONE HCL 50 MG PO TABS
50.0000 mg | ORAL_TABLET | Freq: Every evening | ORAL | Status: DC | PRN
  Administered 2015-10-27: 50 mg via ORAL
  Filled 2015-10-27: qty 1

## 2015-10-27 MED ORDER — FLUOXETINE HCL 20 MG PO CAPS
ORAL_CAPSULE | ORAL | Status: AC
  Filled 2015-10-27: qty 1

## 2015-10-27 NOTE — Progress Notes (Signed)
[facility] . Accepting physician is Dr. Dub MikesLugo.  Call report to [phone].  Representative was [employer].  ER Staff is aware of it (ER Sect.; Dr. Inocencio HomesGayle, ER MD & Ruthie Patient's Nurse)     Please transport after 7:30 pm   10/27/2015 Cheryl FlashNicole Ravan Schlemmer, MS, NCC, LPCA Therapeutic Triage Specialist

## 2015-10-27 NOTE — [facility] Counselor (Signed)
Per Dr. McQueen, patient meets criteria for inpatient hospitalization. [facility] reports no beds available at the moment. Will seek placement options with [facility], [facility], [facility], [facility], [facility] and [facility] 

## 2015-10-27 NOTE — ED Provider Notes (Signed)
-----------------------------------------   7:23 AM on 10/27/2015 -----------------------------------------   Blood pressure 108/75, pulse 69, temperature 98.1 F (36.7 C), temperature source Oral, resp. rate 18, height 5\' 3"  (1.6 m), weight 69.4 kg, SpO2 99 %.  The patient had no acute events since last update.  Calm and cooperative at this time.  Inpatient admission recommended by psychiatry but no beds are available.  They are looking for placement at other facilities.   Loleta Roseory Malai Lady, MD 10/27/15 [phone]

## 2015-10-27 NOTE — ED Notes (Signed)
[facility]19

## 2015-10-27 NOTE — Progress Notes (Signed)
[facility].  Bed will be available later this afternoon. Rosey BathKelly Mahesh Sizemore, RN

## 2015-10-27 NOTE — ED Notes (Signed)
Snack meal and soda beverage provided by this RN. Pt observed with no unusual behavior. Appropriate to stimulation. Pt did not verbalize any other needs or concerns at this time. NAD assessed. Will continue to monitor.  

## 2015-10-27 NOTE — Consult Note (Signed)
[place] Psychiatry Consult   Reason for Consult: Suicidal ideation Referring Physician: Loney Hering, MD  Patient Identification: Shannon Patel MRN:  [id number] Principal Diagnosis: <principal problem not specified> Diagnosis:  There are no active problems to display for this patient.   Total Time spent with patient: 20 minutes  Subjective:   Shannon Patel is a 34 y.o. female patient who presents to [facility] with suicidal ideation after getting into an argument with someone about not having her children.  The pt made statements about wanting to kill herself if she was unable to have her children returned to her.  Patient has a h/o substance abuse and lost her children as a result. Pt denied SI to Dr. Dahlia Client but made statements previously about wanting to kill herself because she did not have her children.  Today on interview, the pt shared she wants to be discharged to her mother's home. She discussed she is no longer feeling suicidal and she wants to prepare for substance abuse rehab treatment this week. As per my original consult note, the pt shared she did not want to live because she no longer had her children due to her drug use. On admission, the pt's serum ethanol level was 249 and the pt cut herself on both wrists, albeit superficially, for the first time.   Today, the pt is eating and sleeping well. She denies cravings for drugs and alcohol. She notes she wants to go home and be with her family.   Past Psychiatric History: Dx: depression, anxiety Psychiatrist: Denies but has an appt next week with [facility] Therapist: Denies but has an appt next week with [facility] Hospitalizations: Yes, related to substance abuse treatment.  ECT: Denies Suicide attempt/Self-harm: Denies Homicide attempts/harming others: Denies Previous Medication Trials: fluoxetine  Past Medical History:  Past Medical History  Diagnosis Date  . Bipolar 1 disorder  ([place])    History reviewed. No pertinent past surgical history. Family History: History reviewed. No pertinent family history.  Family Psychiatric  History:  Depression: mother Anxiety: Denies Bipolar disorder: mother??? Schizophrenia: Denies Panic disorder: Denies  Substance abuse: Bother has been in rehab now for several years.  Alcohol: Father, died of cirrhosis of the liver.   Suicide: Denies   Social History:  History  Alcohol Use  . Yes     History  Drug Use Not on file    Social History   Social History  . Marital Status: Married    Spouse Name: Shannon Patel  . Number of Children: Shannon Patel  . Years of Education: Shannon Patel   Social History Main Topics  . Smoking status: Current Every Day Smoker  . Smokeless tobacco: None  . Alcohol Use: Yes  . Drug Use: None  . Sexual Activity: Not Asked   Other Topics Concern  . None   Social History Narrative   Additional Social History:    Pain Medications: see chart Prescriptions: see chart Over the Counter: see chart History of alcohol / drug use?: Yes Longest period of sobriety (when/how long): 3 months Negative Consequences of Use: Financial, Scientist, research (physical sciences), Personal relationships, Work / School Withdrawal Symptoms:  (denies) Name of Substance 1: alcohol 1 - Age of First Use: 16 1 - Amount (size/oz): varies 1 - Frequency: varies 1 - Last Use / Amount: 10/25/2015   Allergies:  No Known Allergies  Labs:  Results for orders placed or performed during the hospital encounter of 10/26/15 (from the past 48  hour(s))  TSH     Status: None   Collection Time: 10/25/15  8:00 PM  Result Value Ref Range   TSH 2.664 0.350 - 4.500 uIU/mL  T4, free     Status: None   Collection Time: 10/25/15  8:00 PM  Result Value Ref Range   Free T4 1.01 0.61 - 1.12 ng/dL  Comprehensive metabolic panel     Status: Abnormal   Collection Time: 10/26/15 12:19 AM  Result Value Ref Range   Sodium 144 135 - 145 mmol/L   Potassium 3.4 (L) 3.5 - 5.1 mmol/L    Chloride 109 101 - 111 mmol/L   CO2 25 22 - 32 mmol/L   Glucose, Bld 132 (H) 65 - 99 mg/dL   BUN 11 6 - 20 mg/dL   Creatinine, Ser 0.94 0.44 - 1.00 mg/dL   Calcium 9.2 8.9 - 10.3 mg/dL   Total Protein 8.3 (H) 6.5 - 8.1 g/dL   Albumin 4.5 3.5 - 5.0 g/dL   AST 17 15 - 41 U/L   ALT 16 14 - 54 U/L   Alkaline Phosphatase 72 38 - 126 U/L   Total Bilirubin 0.4 0.3 - 1.2 mg/dL   GFR calc non Af Amer >60 >60 mL/min   GFR calc Af Amer >60 >60 mL/min    Comment: (NOTE) The eGFR has been calculated using the CKD EPI equation. This calculation has not been validated in all clinical situations. eGFR's persistently <60 mL/min signify possible Chronic Kidney Disease.    Anion gap 10 5 - 15  Ethanol (ETOH)     Status: Abnormal   Collection Time: 10/26/15 12:19 AM  Result Value Ref Range   Alcohol, Ethyl (B) 249 (H) <5 mg/dL    Comment:        LOWEST DETECTABLE LIMIT FOR SERUM ALCOHOL IS 5 mg/dL FOR MEDICAL PURPOSES ONLY   CBC     Status: Abnormal   Collection Time: 10/26/15 12:19 AM  Result Value Ref Range   WBC 11.5 (H) 3.6 - 11.0 K/uL   RBC 5.40 (H) 3.80 - 5.20 MIL/uL   Hemoglobin 14.8 12.0 - 16.0 g/dL   HCT 45.9 35.0 - 47.0 %   MCV 85.1 80.0 - 100.0 fL   MCH 27.4 26.0 - 34.0 pg   MCHC 32.2 32.0 - 36.0 g/dL   RDW 13.9 11.5 - 14.5 %   Platelets 492 (H) 150 - 440 K/uL  Urine Drug Screen, Qualitative ([facility] only)     Status: None   Collection Time: 10/26/15 12:55 AM  Result Value Ref Range   Tricyclic, Ur Screen NONE DETECTED NONE DETECTED   Amphetamines, Ur Screen NONE DETECTED NONE DETECTED   MDMA (Ecstasy)Ur Screen NONE DETECTED NONE DETECTED   Cocaine Metabolite,Ur [place] NONE DETECTED NONE DETECTED   Opiate, Ur Screen NONE DETECTED NONE DETECTED   Phencyclidine (PCP) Ur S NONE DETECTED NONE DETECTED   Cannabinoid 50 Ng, Ur [place] NONE DETECTED NONE DETECTED   Barbiturates, Ur Screen NONE DETECTED NONE DETECTED   Benzodiazepine, Ur Scrn NONE DETECTED NONE DETECTED   Methadone Scn, Ur  NONE DETECTED NONE DETECTED    Comment: (NOTE) 017  Tricyclics, urine               Cutoff 1000 ng/mL 200  Amphetamines, urine             Cutoff 1000 ng/mL 300  MDMA (Ecstasy), urine           Cutoff 500 ng/mL 400  Cocaine Metabolite, urine       Cutoff 300 ng/mL 500  Opiate, urine                   Cutoff 300 ng/mL 600  Phencyclidine (PCP), urine      Cutoff 25 ng/mL 700  Cannabinoid, urine              Cutoff 50 ng/mL 800  Barbiturates, urine             Cutoff 200 ng/mL 900  Benzodiazepine, urine           Cutoff 200 ng/mL 1000 Methadone, urine                Cutoff 300 ng/mL 1100 1200 The urine drug screen provides only a preliminary, unconfirmed 1300 analytical test result and should not be used for non-medical 1400 purposes. Clinical consideration and professional judgment should 1500 be applied to any positive drug screen result due to possible 1600 interfering substances. A more specific alternate chemical method 1700 must be used in order to obtain a confirmed analytical result.  1800 Gas chromato graphy / mass spectrometry (GC/MS) is the preferred 1900 confirmatory method.   Pregnancy, urine POC     Status: None   Collection Time: 10/26/15  3:49 AM  Result Value Ref Range   Preg Test, Ur NEGATIVE NEGATIVE    Comment:        THE SENSITIVITY OF THIS METHODOLOGY IS >24 mIU/mL     Current Facility-Administered Medications  Medication Dose Route Frequency Provider Last Rate Last Dose  . acetaminophen (TYLENOL) tablet 650 mg  650 mg Oral Q8H PRN Donita Brooks, MD      . FLUoxetine (PROZAC) capsule 20 mg  20 mg Oral Daily Donita Brooks, MD   20 mg at 10/27/15 1340  . folic acid (FOLVITE) tablet 1 mg  1 mg Oral Daily Donita Brooks, MD   1 mg at 10/27/15 0847  . ibuprofen (ADVIL,MOTRIN) tablet 400 mg  400 mg Oral Q8H PRN Donita Brooks, MD   400 mg at 10/26/15 1817  . LORazepam (ATIVAN) tablet 1 mg  1 mg Oral Q6H PRN Donita Brooks, MD       Or  . LORazepam (ATIVAN)  injection 1 mg  1 mg Intravenous Q6H PRN Donita Brooks, MD      . LORazepam (ATIVAN) tablet 0-4 mg  0-4 mg Oral Q6H Donita Brooks, MD   1 mg at 10/26/15 2047   Followed by  . [START ON 10/28/2015] LORazepam (ATIVAN) tablet 0-4 mg  0-4 mg Oral Q12H Donita Brooks, MD      . multivitamin with minerals tablet 1 tablet  1 tablet Oral Daily Donita Brooks, MD   0 tablet at 10/26/15 2051  . nicotine (NICODERM CQ - dosed in mg/24 hours) patch 14 mg  14 mg Transdermal Daily Donita Brooks, MD   14 mg at 10/26/15 1821  . nicotine polacrilex (NICORETTE) gum 2 mg  2 mg Oral PRN Donita Brooks, MD   2 mg at 10/27/15 1340  . thiamine (VITAMIN B-1) tablet 100 mg  100 mg Oral Daily Donita Brooks, MD   100 mg at 10/27/15 0848   Or  . thiamine (B-1) injection 100 mg  100 mg Intravenous Daily Donita Brooks, MD       Current Outpatient Prescriptions  Medication Sig Dispense Refill  . penicillin v potassium (VEETID)  250 MG tablet Take 1 tablet (250 mg total) by mouth 4 (four) times daily. 28 tablet 0    Musculoskeletal: Strength & Muscle Tone: within normal limits Gait & Station: normal Patient leans: Shannon Patel  Psychiatric Specialty Exam: Review of Systems  Psychiatric/Behavioral: Positive for depression and substance abuse. Negative for suicidal ideas, hallucinations and memory loss. The patient has insomnia. The patient is not nervous/anxious.   All other systems reviewed and are negative.   Blood pressure 118/76, pulse 87, temperature 98.3 F (36.8 C), temperature source Oral, resp. rate 16, height _0  (1.6 m), weight 69.4 kg (153 lb), SpO2 99 %.Body mass index is 27.11 kg/(m^2).  General Appearance: Fairly Groomed  Engineer, water::  Good  Speech:  Clear and Coherent and Normal Rate  Volume:  Normal  Mood:  Dysphoric  Affect:  Depressed  Thought Process:  Coherent, Goal Directed, Intact, Linear and Logical  Orientation:  Full (Time, Place, and Person)  Thought Content:  Negative  Suicidal Thoughts:   No  Homicidal Thoughts:  No  Memory:  Immediate;   Good Recent;   Good Remote;   Good  Judgement:  Poor  Insight:  Fair  Psychomotor Activity:  Normal  Concentration:  Good  Recall:  Good  Fund of Knowledge:Good  Language: Good  Akathisia:  Negative  Handed:  Right  AIMS (if indicated):     Assets:  Communication Skills Desire for Improvement Housing Physical Health Resilience Social Support  ADL's:  Intact  Cognition: WNL  Sleep:      Treatment Plan Summary: Medication management  Disposition: Recommend psychiatric Inpatient admission when medically cleared.   1. Continue IVC and seek inpatient placement.  2. Will restart fluoxetine 60m po Qam for depression.  3. Continue nicotine patch 140mper 24 hours and nicotine gum for nicotine withdrawal.  4. R/B/Se discussed including but not limited to any and black box warnings, mood derangements, GI upset, insomnia.  5. TSH and FT4 wnL.  6. Continue CIWA for alcohol withdrawal as a primary preventive measure.    McDonita Brooks2/18/2016 5:42 PM

## 2015-10-28 ENCOUNTER — Encounter (HOSPITAL_COMMUNITY): Payer: Self-pay

## 2015-10-28 ENCOUNTER — Inpatient Hospital Stay (HOSPITAL_COMMUNITY)
Admission: AD | Admit: 2015-10-28 | Discharge: 2015-11-01 | DRG: 897 | Disposition: A | Discharge status: Home / Self Care | Payer: No Typology Code available for payment source | Source: Intra-hospital | Attending: Psychiatry | Admitting: Psychiatry

## 2015-10-28 DIAGNOSIS — Y908 Blood alcohol level of 240 mg/100 ml or more: Secondary | ICD-10-CM | POA: Diagnosis present

## 2015-10-28 DIAGNOSIS — R45851 Suicidal ideations: Secondary | ICD-10-CM | POA: Diagnosis present

## 2015-10-28 DIAGNOSIS — F10120 Alcohol abuse with intoxication, uncomplicated: Secondary | ICD-10-CM | POA: Diagnosis not present

## 2015-10-28 DIAGNOSIS — F1994 Other psychoactive substance use, unspecified with psychoactive substance-induced mood disorder: Secondary | ICD-10-CM | POA: Diagnosis present

## 2015-10-28 DIAGNOSIS — F39 Unspecified mood [affective] disorder: Secondary | ICD-10-CM | POA: Diagnosis present

## 2015-10-28 DIAGNOSIS — F1014 Alcohol abuse with alcohol-induced mood disorder: Secondary | ICD-10-CM | POA: Diagnosis present

## 2015-10-28 DIAGNOSIS — F17210 Nicotine dependence, cigarettes, uncomplicated: Secondary | ICD-10-CM | POA: Diagnosis present

## 2015-10-28 MED ORDER — NICOTINE POLACRILEX 2 MG MT GUM
2.0000 mg | CHEWING_GUM | OROMUCOSAL | Status: DC | PRN
  Administered 2015-10-29 – 2015-11-01 (×10): 2 mg via ORAL
  Filled 2015-10-28 (×3): qty 1

## 2015-10-28 MED ORDER — LOPERAMIDE HCL 2 MG PO CAPS: 2.0000 mg | ORAL_CAPSULE | ORAL | Status: AC | PRN

## 2015-10-28 MED ORDER — CHLORDIAZEPOXIDE HCL 25 MG PO CAPS: 25.0000 mg | ORAL_CAPSULE | Freq: Four times a day (QID) | ORAL | Status: AC | PRN

## 2015-10-28 MED ORDER — CHLORDIAZEPOXIDE HCL 25 MG PO CAPS
25.0000 mg | ORAL_CAPSULE | Freq: Four times a day (QID) | ORAL | Status: AC
  Administered 2015-10-28 – 2015-10-30 (×6): 25 mg via ORAL
  Filled 2015-10-28 (×6): qty 1

## 2015-10-28 MED ORDER — CHLORDIAZEPOXIDE HCL 25 MG PO CAPS
25.0000 mg | ORAL_CAPSULE | ORAL | Status: AC
  Administered 2015-10-31 – 2015-11-01 (×2): 25 mg via ORAL
  Filled 2015-10-28 (×2): qty 1

## 2015-10-28 MED ORDER — NICOTINE POLACRILEX 2 MG MT GUM
CHEWING_GUM | OROMUCOSAL | Status: AC
  Administered 2015-10-28: 2 mg via ORAL
  Filled 2015-10-28: qty 1

## 2015-10-28 MED ORDER — ALUM & MAG HYDROXIDE-SIMETH 200-200-20 MG/5ML PO SUSP: 30.0000 mL | ORAL | Status: DC | PRN

## 2015-10-28 MED ORDER — TRAZODONE HCL 50 MG PO TABS
50.0000 mg | ORAL_TABLET | Freq: Every day | ORAL | Status: DC
  Administered 2015-10-28 – 2015-10-31 (×4): 50 mg via ORAL
  Filled 2015-10-28 (×6): qty 1

## 2015-10-28 MED ORDER — MAGNESIUM HYDROXIDE 400 MG/5ML PO SUSP: 30.0000 mL | Freq: Every day | ORAL | Status: DC | PRN

## 2015-10-28 MED ORDER — ADULT MULTIVITAMIN W/MINERALS CH
1.0000 | ORAL_TABLET | Freq: Every day | ORAL | Status: DC
  Administered 2015-10-29 – 2015-11-01 (×4): 1 via ORAL
  Filled 2015-10-28 (×5): qty 1

## 2015-10-28 MED ORDER — THIAMINE HCL 100 MG/ML IJ SOLN: 100.0000 mg | Freq: Once | INTRAMUSCULAR | Status: DC

## 2015-10-28 MED ORDER — HYDROXYZINE HCL 25 MG PO TABS
25.0000 mg | ORAL_TABLET | Freq: Four times a day (QID) | ORAL | Status: AC | PRN
  Administered 2015-10-28: 25 mg via ORAL
  Filled 2015-10-28: qty 1

## 2015-10-28 MED ORDER — CHLORDIAZEPOXIDE HCL 25 MG PO CAPS
25.0000 mg | ORAL_CAPSULE | Freq: Three times a day (TID) | ORAL | Status: AC
  Administered 2015-10-30 – 2015-10-31 (×3): 25 mg via ORAL
  Filled 2015-10-28 (×3): qty 1

## 2015-10-28 MED ORDER — NICOTINE POLACRILEX 2 MG MT GUM
CHEWING_GUM | OROMUCOSAL | Status: AC
  Filled 2015-10-28: qty 1

## 2015-10-28 MED ORDER — CHLORDIAZEPOXIDE HCL 25 MG PO CAPS: 25.0000 mg | ORAL_CAPSULE | Freq: Every day | ORAL | Status: DC

## 2015-10-28 MED ORDER — ACETAMINOPHEN 325 MG PO TABS
650.0000 mg | ORAL_TABLET | Freq: Four times a day (QID) | ORAL | Status: DC | PRN
  Administered 2015-10-31: 650 mg via ORAL
  Filled 2015-10-28: qty 2

## 2015-10-28 MED ORDER — VITAMIN B-1 100 MG PO TABS
100.0000 mg | ORAL_TABLET | Freq: Every day | ORAL | Status: DC
  Administered 2015-10-29 – 2015-11-01 (×4): 100 mg via ORAL
  Filled 2015-10-28 (×5): qty 1

## 2015-10-28 MED ORDER — ONDANSETRON 4 MG PO TBDP: 4.0000 mg | ORAL_TABLET | Freq: Four times a day (QID) | ORAL | Status: AC | PRN

## 2015-10-28 NOTE — ED Notes (Signed)
Pt belongings given to sheriff.  

## 2015-10-28 NOTE — Consult Note (Signed)
[facility] Face-to-Face Psychiatry Consult   Reason for Consult: Suicidal ideation Referring Physician: Rebecka Apley, MD  Patient Identification: Shannon Patel MRN:  [id number] Principal Diagnosis: <principal problem not specified> Diagnosis:  There are no active problems to display for this patient.   Total Time spent with patient: 20 minutes  Subjective:   Shannon Patel is a 34 y.o. female patient who presents to [facility] with suicidal ideation after getting into an argument with someone about not having her children.  Patient was seen for follow-up. She appeared very anxious and apprehensive as she has learned about her transfer to the [facility] in [place]. She reported that she has started feeling better and does not have any thoughts to hurt herself. She reported that she wants to be discharged as she wants to be with her 3 children who are currently in foster care. Patient reported that she lost the custody of her children in May she started using drugs. Patient reported that she was using alcohol. I also spoke with her mother who reported that patient was using meth amphetamine and was running around from her probation officer. Her mother was concerned about her behavior and does not want her to be discharged at this time. Patient also started drinking which led to this hospitalization. Patient continues to show poor insight into her behavior and was upset about her transfer to the behavioral health unit. She reported that she supposed to go to counseling at [facility] as well as to [facility] as suggested by the drug court.    Past Psychiatric History: Dx: depression, anxiety Psychiatrist: Denies but has an appt next week with [facility] Therapist: Denies but has an appt next week with [facility] Hospitalizations: Yes, related to substance abuse treatment.  ECT: Denies Suicide attempt/Self-harm: Denies Homicide attempts/harming others:  Denies Previous Medication Trials: fluoxetine  Past Medical History:  Past Medical History  Diagnosis Date  . Bipolar 1 disorder ([facility])    History reviewed. No pertinent past surgical history. Family History: History reviewed. No pertinent family history.  Family Psychiatric  History:  Depression: mother Anxiety: Denies Bipolar disorder: mother??? Schizophrenia: Denies Panic disorder: Denies  Substance abuse: Bother has been in rehab now for several years.  Alcohol: Father, died of cirrhosis of the liver.   Suicide: Denies   Social History:  History  Alcohol Use  . Yes     History  Drug Use Not on file    Social History   Social History  . Marital Status: Married    Spouse Name: N/A  . Number of Children: N/A  . Years of Education: N/A   Social History Main Topics  . Smoking status: Current Every Day Smoker  . Smokeless tobacco: None  . Alcohol Use: Yes  . Drug Use: None  . Sexual Activity: Not Asked   Other Topics Concern  . None   Social History Narrative   Additional Social History:    Pain Medications: see chart Prescriptions: see chart Over the Counter: see chart History of alcohol / drug use?: Yes Longest period of sobriety (when/how long): 3 months Negative Consequences of Use: Financial, Armed forces operational officer, Personal relationships, Work / School Withdrawal Symptoms:  (denies) Name of Substance 1: alcohol 1 - Age of First Use: 16 1 - Amount (size/oz): varies 1 - Frequency: varies 1 - Last Use / Amount: 10/25/2015   Allergies:  No Known Allergies  Labs:  No results found for this or  any previous visit (from the past 48 hour(s)).  Current Facility-Administered Medications  Medication Dose Route Frequency Provider Last Rate Last Dose  . acetaminophen (TYLENOL) tablet 650 mg  650 mg Oral Q8H PRN Gena Frayyan G McQueen, MD   650 mg at 10/28/15 0932  . FLUoxetine (PROZAC) capsule 20 mg  20 mg Oral Daily Gena Frayyan G McQueen, MD   20 mg at 10/28/15 0934  . folic acid  (FOLVITE) tablet 1 mg  1 mg Oral Daily Gena Frayyan G McQueen, MD   1 mg at 10/28/15 0932  . ibuprofen (ADVIL,MOTRIN) tablet 400 mg  400 mg Oral Q8H PRN Gena Frayyan G McQueen, MD   400 mg at 10/26/15 1817  . LORazepam (ATIVAN) tablet 1 mg  1 mg Oral Q6H PRN Gena Frayyan G McQueen, MD       Or  . LORazepam (ATIVAN) injection 1 mg  1 mg Intravenous Q6H PRN Gena Frayyan G McQueen, MD      . LORazepam (ATIVAN) tablet 0-4 mg  0-4 mg Oral Q6H Gena Frayyan G McQueen, MD   1 mg at 10/26/15 2047   Followed by  . LORazepam (ATIVAN) tablet 0-4 mg  0-4 mg Oral Q12H Gena Frayyan G McQueen, MD      . multivitamin with minerals tablet 1 tablet  1 tablet Oral Daily Gena Frayyan G McQueen, MD   0 tablet at 10/26/15 2051  . nicotine (NICODERM CQ - dosed in mg/24 hours) patch 14 mg  14 mg Transdermal Daily Gena Frayyan G McQueen, MD   14 mg at 10/26/15 1821  . nicotine polacrilex (NICORETTE) 2 MG gum           . nicotine polacrilex (NICORETTE) gum 2 mg  2 mg Oral PRN Gena Frayyan G McQueen, MD   2 mg at 10/28/15 [id number]  . thiamine (VITAMIN B-1) tablet 100 mg  100 mg Oral Daily Gena Frayyan G McQueen, MD   100 mg at 10/28/15 [id number]   Or  . thiamine (B-1) injection 100 mg  100 mg Intravenous Daily Gena Frayyan G McQueen, MD      . traZODone (DESYREL) tablet 50 mg  50 mg Oral QHS PRN Gena Frayyan G McQueen, MD   50 mg at 10/27/15 2208   Current Outpatient Prescriptions  Medication Sig Dispense Refill  . albuterol (PROVENTIL HFA;VENTOLIN HFA) 108 (90 BASE) MCG/ACT inhaler Inhale 2 puffs into the lungs every 6 (six) hours as needed. For shortness of breath/wheezing.    . famotidine (PEPCID) 20 MG tablet Take 20 mg by mouth daily.    Marland Kitchen. FLUoxetine (PROZAC) 20 MG capsule Take 20 mg by mouth daily.    . Melatonin 5 MG TABS Take 5 mg by mouth at bedtime.    . penicillin v potassium (VEETID) 250 MG tablet Take 1 tablet (250 mg total) by mouth 4 (four) times daily. 28 tablet 0    Musculoskeletal: Strength & Muscle Tone: within normal limits Gait & Station: normal Patient leans: N/A  Psychiatric Specialty  Exam: Review of Systems  Psychiatric/Behavioral: Positive for depression and substance abuse. Negative for suicidal ideas, hallucinations and memory loss. The patient has insomnia. The patient is not nervous/anxious.   All other systems reviewed and are negative.   Blood pressure 117/79, pulse 74, temperature 97.9 F (36.6 C), temperature source Oral, resp. rate 16, height 5\' 3"  (1.6 m), weight 153 lb (69.4 kg), SpO2 98 %.Body mass index is 27.11 kg/(m^2).  General Appearance: Fairly Groomed  Patent attorneyye Contact::  Good  Speech:  Pressured  Volume:  Normal  Mood:  Dysphoric  Affect:  Depressed  Thought Process:  Circumstantial  Orientation:  Full (Time, Place, and Person)  Thought Content:  Negative  Suicidal Thoughts:  No  Homicidal Thoughts:  No  Memory:  Immediate;   Good Recent;   Good Remote;   Good  Judgement:  Poor  Insight:  Fair  Psychomotor Activity:  Normal  Concentration:  Good  Recall:  Good  Fund of Knowledge:Good  Language: Good  Akathisia:  Negative  Handed:  Right  AIMS (if indicated):     Assets:  Communication Skills Desire for Improvement Housing Physical Health Resilience Social Support  ADL's:  Intact  Cognition: WNL  Sleep:      Treatment Plan Summary: Medication management  Disposition: Recommend psychiatric Inpatient admission when medically cleared.   1. Continue IVC and seek inpatient placement.  2. Will restart fluoxetine  po Qam for depression.  3. Continue nicotine patch  per 24 hours and nicotine gum for nicotine withdrawal.  4. R/B/Se discussed including but not limited to any and black box warnings, mood derangements, GI upset, insomnia.  5. TSH and FT4 wnL.  6. Continue CIWA for alcohol withdrawal as a primary preventive measure.    Brandy Hale 10/28/2015 10:17 AM

## 2015-10-28 NOTE — ED Notes (Signed)
Pt in dayroom. Pt not happy about inpatient [facility]. Remains cooperative and calm on the unit.  No noted distress or abnormal behaviors noted. Will continue 15 minute checks and observation by security camera for safety.

## 2015-10-28 NOTE — ED Provider Notes (Signed)
-----------------------------------------   7:05 AM on 10/28/2015 -----------------------------------------   Blood pressure 119/81, pulse 71, temperature 98.1 F (36.7 C), temperature source Oral, resp. rate 16, height 5\' 3"  (1.6 m), weight 153 lb (69.4 kg), SpO2 99 %.  The patient had no acute events since last update.  Calm and cooperative at this time.    If outpatient appointment can be confirmed and the patient may be discharged but if the patient does not have any outpatient appointments then she has a bed available [facility] and she will be transported today.     Rebecka ApleyAllison P Siddalee Vanderheiden, MD 10/28/15 [phone]

## 2015-10-28 NOTE — ED Notes (Signed)
Pt being transported by [employer] Sheriff. Pt belongings given to sheriff.

## 2015-10-28 NOTE — ED Notes (Signed)
Patient asleep in room. No noted distress or abnormal behavior. Will continue 15 minute checks and observation by security cameras for safety. 

## 2015-10-28 NOTE — ED Notes (Signed)
BEHAVIORAL HEALTH ROUNDING Patient sleeping: Yes.   Patient alert and oriented: not applicable Behavior appropriate: Yes.  ; If no, describe:   Nutrition and fluids offered: No Toileting and hygiene offered: No Sitter present: no Law enforcement present: Yes  and ODS    

## 2015-10-28 NOTE — Plan of Care (Signed)
Problem: Diagnosis: Increased Risk For Suicide Attempt Goal: STG-Patient Will Comply With Medication Regime Outcome: Progressing Pt compliant with medication     

## 2015-10-28 NOTE — ED Notes (Signed)
BEHAVIORAL HEALTH ROUNDING Patient sleeping: No. Patient alert and oriented: yes Behavior appropriate: Yes.  ; If no, describe:   Nutrition and fluids offered: Yes  Toileting and hygiene offered: Yes  Sitter present: no Law enforcement present: Yes  and ODS  

## 2015-10-28 NOTE — ED Notes (Signed)
Pt upset [facility] is being [facility]. "I have too much to do this week." Pt is upset she will not get to meet with her children on Thursday.  Pt had to be informed she was currently under IVC and did not have the option to go home.

## 2015-10-28 NOTE — ED Provider Notes (Signed)
-----------------------------------------   10:55 AM on 10/28/2015 -----------------------------------------   Blood pressure 117/79, pulse 74, temperature 97.9 F (36.6 C), temperature source Oral, resp. rate 16, height 5\' 3"  (1.6 m), weight 153 lb (69.4 kg), SpO2 98 %.  The patient had no acute events since last update.  Calm and cooperative at this time.  Disposition is pending per Psychiatry/Behavioral Medicine team recommendations.   Patient has been excepted at [facility] appropriate M talo paperwork has been filled out. Patient will be transferred deferred when staff available.  Jennye MoccasinBrian S Quigley, MD 10/28/15 1055

## 2015-10-28 NOTE — ED Notes (Signed)
BEHAVIORAL HEALTH ROUNDING Patient sleeping: Yes.   Patient alert and oriented: no Behavior appropriate: Yes.  ; If no, describe:   Nutrition and fluids offered: No Toileting and hygiene offered: No Sitter present: no Law enforcement present: Yes  and ODS 

## 2015-10-28 NOTE — Progress Notes (Signed)
Patient ID: Shannon Patel, female   DOB: April 28, 1981, 34 y.o.   MRN: [id number]  Initial Interdisciplinary Treatment Plan   PATIENT STRESSORS: Financial difficulties Legal issue Loss of 3 children to social services Marital or family conflict Medication change or noncompliance Occupational concerns Substance abuse   PATIENT STRENGTHS: Active sense of humor Average or above average intelligence Capable of independent living Communication skills General fund of knowledge Motivation for treatment/growth Physical Health Supportive family/friends   PROBLEM LIST: Problem List/Patient Goals Date to be addressed Date deferred Reason deferred Estimated date of resolution  "I want to work, I just feel so depressed every other day, I don't want to get fired" 10/28/2015      "I lost my three kids, they are in foster care" 10/28/2015     "mom says I am using alcohol as a substitute for the other things" - hx of pill abuse 10/28/2015     "I'm not sure where I'll be living when I leave" 10/28/2015     "I'm at my lowest point" 10/28/2015                              DISCHARGE CRITERIA:  Adequate post-discharge living arrangements Improved stabilization in mood, thinking, and/or behavior Need for constant or close observation no longer present Safe-care adequate arrangements made Verbal commitment to aftercare and medication compliance  PRELIMINARY DISCHARGE PLAN: Attend aftercare/continuing care group Attend PHP/IOP  PATIENT/FAMIILY INVOLVEMENT: This treatment plan has been presented to and reviewed with the patient, Shannon MayoRhonda G Patel.The patient and family have been given the opportunity to ask questions and make suggestions.  Aurora Maskwyman, Leroy Trim E 10/28/2015, 5:07 PM

## 2015-10-28 NOTE — ED Notes (Signed)
Patient resting quietly in room. No noted distress or abnormal behaviors noted. Will continue 15 minute checks and observation by security camera for safety. 
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
## 2015-10-28 NOTE — Progress Notes (Signed)
Patient attended AA group.   

## 2015-10-28 NOTE — Progress Notes (Signed)
Patient ID: Shannon Patel, female   DOB: Feb 19, 1981, 34 y.o.   MRN: [id number] D: Patient in dayroom interacting and playing cards with peers. Pt c/o insomnia reports she has not slept for days. Pt mood and affect is anxious. Pt denies SI/HI/AVH and pain. Pt attended and participated in evening AA group. Cooperative with assessment.    A: Met with pt 1:1. Medications administered as prescribed. Support and encouragement provided. Pt encouraged to discuss feelings and come to staff with any question or concerns.   R: Patient remains safe and complaint with medications.

## 2015-10-28 NOTE — ED Notes (Addendum)
Pt making phone call. Denies SI/HI and AVH. Pain 5/10 in back. Pt reported the back pain is from the bed in the BHU. Pt stated she wishes to go home. Pt stated "I don't need to be confined right now. Im getting ready to go to rehab for a year."  Pt cooperative with Clinical research associatewriter.  No noted distress or abnormal behaviors noted. Will continue 15 minute checks and observation by security camera for safety.

## 2015-10-28 NOTE — ED Notes (Signed)
Patient resting quietly in room. Pt asking when her transportation will arrive. RN informed pt we did not know an exact time, but it would hopefully be today.  No noted distress or abnormal behaviors noted. Will continue 15 minute checks and observation by security camera for safety.

## 2015-10-28 NOTE — ED Notes (Signed)
Pt eating lunch.  No noted distress or abnormal behaviors noted. Will continue 15 minute checks and observation by security camera for safety. 

## 2015-10-28 NOTE — ED Notes (Signed)
BEHAVIORAL HEALTH ROUNDING Patient sleeping: No. Patient alert and oriented: yes Behavior appropriate: Yes.  ; If no, describe:   Nutrition and fluids offered: Yes  Toileting and hygiene offered: Yes  Sitter present: no Law enforcement present: Yes  and ODS  ED BHU PLACEMENT JUSTIFICATION Is the patient under IVC or is there intent for IVC: Yes.   Is the patient medically cleared: Yes.   Is there vacancy in the ED BHU: Yes.   Is the population mix appropriate for patient: Yes.   Is the patient awaiting placement in inpatient or outpatient setting: Yes.   Has the patient had a psychiatric consult: Yes.   Survey of unit performed for contraband, proper placement and condition of furniture, tampering with fixtures in bathroom, shower, and each patient room: Yes.  ; Findings: all clear APPEARANCE/BEHAVIOR calm, cooperative and adequate rapport can be established NEURO ASSESSMENT Orientation: time, place and person Hallucinations: No.None noted (Hallucinations) Speech: Normal Gait: normal RESPIRATORY ASSESSMENT Normal expansion.  Clear to auscultation.  No rales, rhonchi, or wheezing. CARDIOVASCULAR ASSESSMENT regular rate and rhythm, S1, S2 normal, no murmur, click, rub or gallop GASTROINTESTINAL ASSESSMENT soft, nontender, BS WNL, no r/g EXTREMITIES normal strength, tone, and muscle mass, no deformities, ROM of all joints is normal, gait was normal for age PLAN OF CARE Provide calm/safe environment. Vital signs assessed twice daily. ED BHU Assessment once each 12-hour shift. Collaborate with intake RN daily or as condition indicates. Assure the ED provider has rounded once each shift. Provide and encourage hygiene. Provide redirection as needed. Assess for escalating behavior; address immediately and inform ED provider.  Assess family dynamic and appropriateness for visitation as needed: Yes.  ; If necessary, describe findings:   Educate the patient/family about BHU  procedures/visitation: Yes.  ; If necessary, describe findings:    ENVIRONMENTAL ASSESSMENT Potentially harmful objects out of patient reach: Yes.   Personal belongings secured: Yes.   Patient dressed in hospital provided attire only: Yes.   Plastic bags out of patient reach: Yes.   Patient care equipment (cords, cables, call bells, lines, and drains) shortened, removed, or accounted for: Yes.   Equipment and supplies removed from bottom of stretcher: Yes.   Potentially toxic materials out of patient reach: Yes.   Sharps container removed or out of patient reach: Yes.    

## 2015-10-29 NOTE — Progress Notes (Signed)
D:  Patient's self inventory sheet, patient has good sleep, sleep medication is helpful.  Good appetite, normal energy level, good concentration.  Denied depression, hopeless and anxiety.  Denied withdrawals.  Denied SI.  Denied physical pain.  Goal is to focus, be involved in group.  Plans to attend groups.  "I'm feeling better already."  No discharge plans. A:  Medications administered per MD orders.  Emotional support and encouragement given patient. R:  Denied SI and HI, contracts for safety.  Denied A/V hallucinations.  Safety maintained with 15 minute checks.
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
## 2015-10-29 NOTE — Plan of Care (Signed)
Problem: Alteration in mood Goal: LTG-Patient reports reduction in suicidal thoughts (Patient reports reduction in suicidal thoughts and is able to verbalize a safety plan for whenever patient is feeling suicidal)  Outcome: Progressing Nurse discussed suicidal thoughts/depression/coping skills with patient.        

## 2015-10-29 NOTE — Progress Notes (Signed)
Recreation Therapy Notes  Animal-Assisted Activity (AAA) Program Checklist/Progress Notes Patient Eligibility Criteria Checklist & Daily Group note for Rec Tx Intervention  Date: 12.20.2016  Time: 2:45pm [address]: [address]   AAA/T Program Assumption of Risk Form signed by Patient/ or Parent Legal Guardian yes  Patient is free of allergies or sever asthma yes  Patient reports no fear of animals yes  Patient reports no history of cruelty to animals yes  Patient understands his/her participation is voluntary yes  Patient washes hands before animal contact yes  Patient washes hands after animal contact yes  Behavioral Response: Appropriate   Education: Hand Washing, Appropriate Animal Interaction   Education Outcome: Acknowledges education.   Clinical Observations/Feedback: Patient attended session and engaged appropriately with peers and therapy dog team.   Jearl Klinefelterenise L Orlondo Holycross, LRT/CTRS         Jearl KlinefelterBlanchfield, Katlynn Naser L 10/29/2015 2:59 PM

## 2015-10-29 NOTE — [facility] Group Notes (Signed)
The focus of this group is to educate the patient on the purpose and policies of crisis stabilization and provide a format to answer questions about their admission.  The group details unit policies and expectations of patients while admitted.  Patient attended 0900 nurse education orientation group this morning.  Patient actively participated and had appropriate affect.  Patient is alert.  Patient had appropriate insight and appropriate engagement.  Today patient will work on 3 goals for discharge.  

## 2015-10-29 NOTE — [facility] Suicide Risk Assessment (Signed)
[facility] INPATIENT:  Family/Significant Other Suicide Prevention Education  Suicide Prevention Education:  Patient Refusal for Family/Significant Other Suicide Prevention Education: The patient Shannon Patel has refused to provide written consent for family/significant other to be provided Family/Significant Other Suicide Prevention Education during admission and/or prior to discharge.  Physician notified.  "Why cant I talk for myself - theres no one you need to talk to."  Sallee LangeCunningham, Ozan Maclay C 10/29/2015, 4:53 PM

## 2015-10-29 NOTE — [facility] Suicide Risk Assessment (Signed)
[facility]   Nursing information obtained from:    Demographic factors:    Current Mental Status:    Loss Factors:    Historical Factors:    Risk Reduction Factors:    Total Time spent with patient: 45 minutes Principal Problem: Substance induced mood disorder (HCC) Diagnosis:   Patient Active Problem List   Diagnosis Date Noted  . Substance induced mood disorder (HCC) [F19.94] 10/29/2015  . 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 Alcohol intoxication (HCC) [F10.129]   . Suicidal ideation [R45.851]      Continued Clinical Symptoms:  Alcohol Use Disorder Identification Test Final Score (AUDIT): 8 The "Alcohol Use Disorders Identification Test", Guidelines for Use in Primary Care, Second Edition.  World Science writerHealth Organization [facility](WHO). Score between 0-7:  no or low risk or alcohol related problems. Score between 8-15:  moderate risk of alcohol related problems. Score between 16-19:  high risk of alcohol related problems. Score 20 or above:  warrants further diagnostic evaluation for alcohol dependence and treatment.   CLINICAL FACTORS:   Depression:   Anhedonia Comorbid alcohol abuse/dependence Hopelessness Impulsivity Alcohol/Substance Abuse/Dependencies   Psychiatric Specialty Exam: Physical Exam  ROS  Blood pressure 119/67, pulse 76, temperature 97.5 F (36.4 C), temperature source Oral, resp. rate 16, height 5\' 3"  (1.6 m), weight 74.844 kg (165 lb), SpO2 99 %.Body mass index is 29.24 kg/(m^2).  Sleep:  Number of Hours: 6.25     COGNITIVE FEATURES THAT CONTRIBUTE TO RISK:  None    SUICIDE RISK:   Minimal: No identifiable suicidal ideation.  Patients presenting with no risk factors but with morbid ruminations; may be classified as minimal risk based on the severity of the depressive symptoms  PLAN OF CARE: Patient is 34 year old Caucasian unemployed female who was admitted  due to severe depression and having suicidal thoughts.  She is drinking alcohol heavily and her blood alcohol level is 249.  She requires treatment and stabilization .  Please see history and physical for more details and complete his treatment plan.    Medical Decision Making:  New problem, with additional work up planned, Review of Psycho-Social Stressors (1), Review or order clinical lab tests (1), Decision to obtain old records (1), Review and summation of old records (2), Established Problem, Worsening (2), New Problem, with no additional work-up planned (3), Review of Medication Regimen & Side Effects (2) and Review of New Medication or Change in Dosage (2)  I certify that inpatient services furnished can reasonably be expected to improve the patient's condition.   Shannon Patel. 10/29/2015, 2:30 PM

## 2015-10-29 NOTE — Progress Notes (Signed)
Adult Psychoeducational Group Note  Date:  10/29/2015 Time:  8:45 PM  Group Topic/Focus:  Wrap-Up Group:   The focus of this group is to help patients review their daily goal of treatment and discuss progress on daily workbooks.  Participation Level:  Active  Participation Quality:  Attentive  Affect:  Appropriate  Cognitive:  Appropriate  Insight: Good  Engagement in Group:  Engaged  Modes of Intervention:  Discussion  Additional Comments:  Pt rated overall day a 10 out of 10 because she felt positive today. Pt reproted that the positive fellowship with other patients on the unit along with seeing her family and receiving her clothes were the highlights of her day.   Shannon NeerJasmine Patel Areg Bialas 10/29/2015, 9:45 PM
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
## 2015-10-29 NOTE — Tx Team (Signed)
Interdisciplinary Treatment Plan Update (Adult)  Date:  10/29/2015  Time Reviewed:  8:44 AM   Progress in Treatment: Attending groups: No. Participating in groups:  No. Taking medication as prescribed:  Yes. Tolerating medication:  Yes. Family/Significant othe contact made:   Patient understands diagnosis:  Yes. and As evidenced by:  seeking treatment for SI, depression, etoh abuse, and medication stabilization Discussing patient identified problems/goals with staff:  Yes. Medical problems stabilized or resolved:  Yes. Denies suicidal/homicidal ideation: No. Passive SI/able to contract for safety on the unit.  Issues/concerns per patient self-inventory:  Other:   Discharge Plan or Barriers:   Reason for Continuation of Hospitalization: Depression Medication stabilization Withdrawal symptoms  SI  Comments:  Shannon Patel is an 34 y.o. female. Patient was brought into the ED because of intoxication and threats of suicide. According to previous notes the patient made threats to kill herself if she is unable to get her children back from DSS. Patient currently denies SI/HI, hallucinations, and other self injurious behaviors. Patient is tearful, hopeless, guilty, irritability, and despondent. Patient reports an unwillingness to live without her children. Patient reports that the case worker with DSS has recommended long-term treatment as an options for her children to return to her custody but the patient doubtful about getting all her children back. Patient reports drinking a fifth of fireball whiskey prior to this ED admission. Patient reports the longest abstinence is 3 months. Diagnosis: Substance Induced Mood disorder, Alcohol use, moderate  Estimated length of stay:  3-5 days   New goal(s): to develop effective aftercare plan.   Additional Comments:  Patient and CSW reviewed pt's identified goals and treatment plan. Patient verbalized understanding and agreed to treatment  plan. CSW reviewed [facility] "Discharge Process and Patient Involvement" Form. Pt verbalized understanding of information provided and signed form.    Review of initial/current patient goals per problem list:  1. Goal(s): Patient will participate in aftercare plan  Met: No.   Target date: at discharge  As evidenced by: Patient will participate within aftercare plan AEB aftercare provider and housing plan at discharge being identified.  12/20: CSW assessing for appropriate referrals.   2. Goal (s): Patient will exhibit decreased depressive symptoms and suicidal ideations.  Met: No.    Target date: at discharge  As evidenced by: Patient will utilize self rating of depression at 3 or below and demonstrate decreased signs of depression or be deemed stable for discharge by MD.  12/20: Pt reports depression is high. Passive SI. No Hi/AVH. Pt able to contract for safety on the unit.   3. Goal(s): Patient will demonstrate decreased signs of withdrawal due to substance abuse  Met:No.   Target date:at discharge   As evidenced by: Patient will produce a CIWA/COWS score of 0, have stable vitals signs, and no symptoms of withdrawal.  12/20: Pt reports minimal withdrawal symptoms with CIWA score of 0 this morning and high sitting BP. Goal progressing.   Attendees: Patient:   10/29/2015 8:44 AM   Family:   10/29/2015 8:44 AM   Physician:  Dr. Conception Chancy MD 10/29/2015 8:44 AM   Nursing:   Rhea Pink RN 10/29/2015 8:44 AM   Clinical Social Worker: Maxie Better, LCSW 10/29/2015 8:44 AM   Clinical Social Worker: Erasmo Downer Drinkard LCSWA; Peri Maris LCSWA 10/29/2015 8:44 AM   Other:  Gerline Legacy Nurse Case Manager 10/29/2015 8:44 AM   Other:   10/29/2015 8:44 AM   Other:   10/29/2015 8:44 AM  Other:  10/29/2015 8:44 AM   Other:  10/29/2015 8:44 AM   Other:  10/29/2015 8:44 AM    10/29/2015 8:44 AM    10/29/2015 8:44 AM    10/29/2015 8:44 AM    10/29/2015 8:44 AM     Scribe for Treatment Team:   Maxie Better, LCSW 10/29/2015 8:44 AM

## 2015-10-30 NOTE — Progress Notes (Signed)
Patient provided with information on [facility] recovery programs.  Samuella BruinKristin Sakiyah Shur, MSW, [employer]LCSWA Clinical Social Worker [facility] [phone]

## 2015-10-30 NOTE — Progress Notes (Signed)
Pt was observed in the dayroom at the beginning of the shift talking with peers and watching TV.  She reports that she had a good day and feels her medications are working for her.  She denies SI/HI/AVH.  She is hoping to be discharged tomorrow.  She denies any withdrawal symptoms this evening.  She is hoping she can stay with her mother and she is also thinking about getting into a program to help her stay sober.  She is pleasant and cooperative with staff.  She makes her needs known to staff.  Support and encouragement offered.  Safety maintained with q15 minute checks.

## 2015-10-30 NOTE — Progress Notes (Signed)
Recreation Therapy Notes  Date: 12.21.2016 Time: 9:30am Location: [address] Group Room   Group Topic: Stress Management  Goal Area(s) Addresses:  Patient will actively participate in stress management techniques presented during session.   Behavioral Response: Appropriate, Engaged   Intervention: Stress management techniques  Activity :  Deep Breathing and Progressive Muscle Relaxation. LRT provided instruction and demonstration on practice of Progressive Muscle Relaxation. Technique was coupled with deep breathing.   Education:  Stress Management, Discharge Planning.   Education Outcome: Acknowledges education  Clinical Observations/Feedback: Patient actively engaged in technique introduced, expressed no concerns and demonstrated ability to practice independently post d/c.   Shannon Patel L Amiria Orrison, LRT/CTRS        Shannon Patel L 10/30/2015 11:51 AM 

## 2015-10-30 NOTE — [facility] Group Notes (Addendum)
[facility] LCSW Group Therapy 10/30/2015  1:15 PM Type of Therapy: Group Therapy Participation Level: Active  Participation Quality: Attentive, Sharing and Supportive  Affect: Appropriate  Cognitive: Alert and Oriented  Insight: Developing/Improving and Engaged  Engagement in Therapy: Developing/Improving and Engaged  Modes of Intervention: Clarification, Confrontation, Discussion, Education, Exploration, Limit-setting, Orientation, Problem-solving, Rapport Building, Dance movement psychotherapisteality Testing, Socialization and Support  Summary of Progress/Problems: The topic for group today was emotional regulation. This group focused on both positive and negative emotion identification and allowed group members to process ways to identify feelings, regulate negative emotions, and find healthy ways to manage internal/external emotions. Group members were asked to reflect on a time when their reaction to an emotion led to a negative outcome and explored how alternative responses using emotion regulation would have benefited them. Group members were also asked to discuss a time when emotion regulation was utilized when a negative emotion was experienced. Patient participated minimally in discussion but offered emotional support and encouragement to another patient who discussed struggling with grief.   Samuella BruinKristin Allia Wiltsey, MSW, [employer]CSWA Clinical Social Worker [facility] [phone]

## 2015-10-30 NOTE — [facility] Group Notes (Signed)
[facility] LCSW Group Therapy 10/29/15 1:15 PM Type of Therapy: Group Therapy Participation Level: Active  Participation Quality: Attentive, Sharing and Supportive  Affect: Appropriate  Cognitive: Alert and Oriented  Insight: Developing/Improving and Engaged  Engagement in Therapy: Developing/Improving and Engaged  Modes of Intervention: Activity, Clarification, Confrontation, Discussion, Education, Exploration, Limit-setting, Orientation, Problem-solving, Rapport Building, Dance movement psychotherapisteality Testing, Socialization and Support  Summary of Progress/Problems: Patient was attentive and engaged with speaker from [employer]. Patient was attentive to speaker while they shared their story of dealing with mental health and overcoming it. Patient expressed interest in their programs and services and received information on their agency. Patient processed ways they can relate to the speaker.   Shannon BruinKristin Marilu Patel, MSW, [employer]CSWA Clinical Social Worker [facility] [phone]

## 2015-10-30 NOTE — Progress Notes (Addendum)
[facility] MD Progress Note  10/30/2015 12:53 PM Shannon Patel  MRN:  [id number]   Subjective:  I still feel very anxious nervous and a half tremors.  I still have poor sleep but I'm getting better.  Objective: Patient seen chart reviewed.  Patient is 34 year old Caucasian female who was admitted due to severe depression and having suicidal thoughts.  She was drinking heavily and her alcohol level was 249.  She has tremors shakes and having withdrawal symptoms.  She was very upset because she could not have visitation to see her 85-year-old daughter.  Patient is still feeling very anxious and nervous and depressed.  She is going to the groups.  She still have shakes and tremors but they're less intense and less frequent.  She is a Librium protocol.  She denies any hallucination or any paranoia.  She is tolerating medication without any side effects.  She is going to the groups but she has limited participation.  She has nausea sometimes.  She is hoping to live with her mother upon discharge.  Principal Problem: Substance induced mood disorder (HCC) Diagnosis:   Patient Active Problem List   Diagnosis Date Noted  . Substance induced mood disorder (HCC) [F19.94] 10/29/2015  . 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 Alcohol intoxication (HCC) [F10.129]   . Suicidal ideation [R45.851]    Total Time spent with patient: 20 minutes  Past Psychiatric History: See history and physical.  Past Medical History:  Past Medical History  Diagnosis Date  . Bipolar 1 disorder (HCC)    History reviewed. No pertinent past surgical history. Family History: History reviewed. No pertinent family history. Family Psychiatric  History: See history and physical. Social History:  History  Alcohol Use  . Yes     History  Drug Use Not on file    Social History   Social History  . Marital Status: Married    Spouse Name: N/A  . Number of  Children: N/A  . Years of Education: N/A   Social History Main Topics  . Smoking status: Current Every Day Smoker -- 0.50 packs/day for 10 years    Types: Cigarettes  . Smokeless tobacco: None  . Alcohol Use: Yes  . Drug Use: None  . Sexual Activity: Not Asked   Other Topics Concern  . 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 acetaminophen (TYLENOL) tablet 650 mg  650 mg Oral Q6H PRN Gena Fray, MD      . alum & mag hydroxide-simeth (MAALOX/MYLANTA) 200-200-20 MG/5ML suspension 30 mL  30 mL Oral Q4H PRN Gena Fray, MD      . chlordiazePOXIDE (LIBRIUM) capsule 25 mg  25 mg Oral Q6H PRN Kerry Hough, PA-C      .  chlordiazePOXIDE (LIBRIUM) capsule 25 mg  25 mg Oral TID Kerry HoughSpencer E Simon, PA-C   25 mg at 10/30/15 1156   Followed by  . [START ON 10/31/2015] chlordiazePOXIDE (LIBRIUM) capsule 25 mg  25 mg Oral BH-qamhs Spencer E Simon, PA-C       Followed by  . [START ON 11/02/2015] chlordiazePOXIDE (LIBRIUM) capsule 25 mg  25 mg Oral Daily Kerry HoughSpencer E Simon, PA-C      . hydrOXYzine (ATARAX/VISTARIL) tablet 25 mg  25 mg Oral Q6H PRN Kerry HoughSpencer E Simon, PA-C   25 mg at 10/28/15 2229  . loperamide (IMODIUM) capsule 2-4 mg  2-4 mg Oral PRN Kerry HoughSpencer E Simon, PA-C      . magnesium hydroxide (MILK OF MAGNESIA) suspension 30 mL  30 mL Oral Daily  PRN Gena Frayyan G McQueen, MD      . multivitamin with minerals tablet 1 tablet  1 tablet Oral Daily Kerry HoughSpencer E Simon, PA-C   1 tablet at 10/30/15 0755  . nicotine polacrilex (NICORETTE) gum 2 mg  2 mg Oral PRN Rachael FeeIrving A Lugo, MD   2 mg at 10/30/15 0756  . ondansetron (ZOFRAN-ODT) disintegrating tablet 4 mg  4 mg Oral Q6H PRN Kerry HoughSpencer E Simon, PA-C      . thiamine (B-1) injection 100 mg  100 mg Intramuscular Once Kerry HoughSpencer E Simon, PA-C   100 mg at 10/28/15 2100  . thiamine (VITAMIN B-1) tablet 100 mg  100 mg Oral Daily Kerry HoughSpencer E Simon, PA-C   100 mg at 10/30/15 [id number]  . traZODone (DESYREL) tablet 50 mg  50 mg Oral QHS Gena Frayyan G McQueen, MD   50 mg at 10/29/15 2118    Lab Results: No results found for this or any previous visit (from the past 48 hour(s)).  Physical Findings: AIMS: Facial and Oral Movements Muscles of Facial Expression: None, normal Lips and Perioral Area: None, normal Jaw: None, normal Tongue: None, normal,Extremity Movements Upper (arms, wrists, hands, fingers): None, normal Lower (legs, knees, ankles, toes): None, normal, Trunk Movements Neck, shoulders, hips: None, normal, Overall Severity Severity of abnormal movements (highest score from questions above): None, normal Incapacitation due to abnormal movements: None, normal Patient's awareness of abnormal movements (rate only patient's report): No Awareness, Dental Status Current problems with teeth and/or dentures?: No Does patient usually wear dentures?: No  CIWA:  CIWA-Ar Total: 0 COWS:  COWS Total Score: 1  Musculoskeletal: Strength & Muscle Tone: within normal limits Gait & Station: normal Patient leans: N/A  Psychiatric Specialty Exam: Review of Systems  Gastrointestinal: Positive for nausea.  Musculoskeletal: Negative.   Skin: Negative for itching and rash.  Neurological: Positive for tremors. Negative for dizziness.  Psychiatric/Behavioral: Positive for depression and substance abuse. The patient is nervous/anxious  and has insomnia.     Blood pressure 117/68, pulse 83, temperature 97.8 F (36.6 C), temperature source Oral, resp. rate 16, height 5\' 3"  (1.6 m), weight 74.844 kg (165 lb), SpO2 99 %.Body mass index is 29.24 kg/(m^2).  General Appearance: Disheveled and Fairly Groomed  Patent attorneyye Contact::  Minimal  Speech:  Slow  Volume:  Decreased  Mood:  Anxious, Depressed and Dysphoric  Affect:  Depressed  Thought Process:  Intact  Orientation:  Full (Time, Place, and Person)  Thought Content:  Rumination  Suicidal Thoughts:  Yes.  without intent/plan  Homicidal Thoughts:  No  Memory:  Immediate;   Fair Recent;   Fair Remote;   Fair  Judgement:  Fair  Insight:  Fair  Psychomotor Activity:  Tremor  Concentration:  Fair  Recall:  [employer] of Knowledge:Fair  Language: Fair  Akathisia:  No  Handed:  Right  AIMS (if indicated):     Assets:  Communication Skills Desire for Improvement  ADL's:  Intact  Cognition: WNL  Sleep:  Number of Hours: 6.5   Treatment Plan Summary: Daily contact with patient to assess and evaluate symptoms and progress in treatment and Medication management continue detox protocol as patient is to have tremors shakes.  Encouraged to participate in group milieu therapy. Patient remains depressed and consider adding antidepressant.  Reviewed blood work results.  Blood alcohol level 249, pregnancy test negative, UDS negative, chemistry shows potassium 3.4 and glucose 132 and CBC shows platelets 492.   Shannon Janowski T. 10/30/2015, 12:53 PM

## 2015-10-30 NOTE — [facility] Group Notes (Signed)
   [facility] L[facility] Dis[facility] Planning [facility] Note  10/30/2015  8:45 AM   Participation Quality: Alert, Appropriate and Oriented  Mood/Affect: Appropriate  Depression Rating: 1  Anxiety Rating: 0  Thoughts of Suicide: Pt denies SI/HI  Will you contract for safety? Yes  Current AVH: Pt denies  Plan for Discharge/Comments: Pt attended discharge planning group and actively participated in group. CSW provided pt with today's workbook. Patient plans to stay with her mother at discharge and follow up with outpatient services.   Transportation Means: Pt reports access to transportation  Supports: No supports mentioned at this time  Samuella BruinKristin Ruchama Kubicek, MSW, [employer]CSWA Clinical Social Worker [facility] [phone]

## 2015-10-30 NOTE — Progress Notes (Signed)
Patient attended N/A group meeting tonight. 

## 2015-10-30 NOTE — Plan of Care (Signed)
Problem: Alteration in mood Goal: LTG-Pt's behavior demonstrates decreased signs of depression (Patient's behavior demonstrates decreased signs of depression to the point the patient is safe to return home and continue treatment in an outpatient setting)  Outcome: Progressing Nurse discussed depression/coping skills with patient.

## 2015-10-30 NOTE — Progress Notes (Signed)
D:  Patient's self inventory sheet, patient sleeps good, sleep medication is helpful.  Good appetite, normal energy level, good concentration.  Denied depression, hopeless and anxiety.  Denied withdrawals.  Denied SI.  Denied physical problems.  Denied pain.  Goal is to plan for future, discharge.  Goal is to be involved, fellowship.  Does have discharge plans. A:  Medications administered per MD orders.  Emotional support and encouragement given patient. R:  Denied SI and HI, contracts for safety.  Denied A/V hallucinations.  Denied pain.  Safety maintained with 15 minute checks.

## 2015-10-31 DIAGNOSIS — R45851 Suicidal ideations: Secondary | ICD-10-CM

## 2015-10-31 DIAGNOSIS — F1994 Other psychoactive substance use, unspecified with psychoactive substance-induced mood disorder: Secondary | ICD-10-CM

## 2015-10-31 MED ORDER — FLUOXETINE HCL 20 MG PO CAPS
20.0000 mg | ORAL_CAPSULE | Freq: Every day | ORAL | Status: DC
  Administered 2015-10-31 – 2015-11-01 (×2): 20 mg via ORAL
  Filled 2015-10-31 (×4): qty 1

## 2015-10-31 NOTE — Progress Notes (Signed)
D:Per patient self inventory form pt reports she slept good last night with the use of sleep medication. She reports a good appetite, normal energy level, good concentration. Pt rates depression 0/10, hopelessness 0/10, anxiety 0/10- all on 0-10 scale, 10 being the worse. Pt denies SI/HI. Denies AVH. Denies physical problems. Denies s/s of withdrawals. Pt reports her goal for the day is"Getting home so I can take care of my next steps to success." Pt reports she will "be active and be involved, stay positive" to help meet her goal today" "Thank you! I feel a lot better and believe the meds are going to make a life a diff." Pt attending groups on the unit. Noted socializing with peers.  A:Special checks q 15 mins in place for safety. Medication administered per MD order(see eMAR) Encouragement and support provided.  R:safety maintained. Compliant with medication regimen. Will continue to monitor.

## 2015-10-31 NOTE — Progress Notes (Signed)
Nutrition Education Note  Pt attended group focusing on general, healthful nutrition education.  RD emphasized the importance of eating regular meals and snacks throughout the day. Consuming sugar-free beverages and incorporating fruits and vegetables into diet when possible. Provided examples of healthy snacks. Patient encouraged to leave group with a goal to improve nutrition/healthy eating.   Diet Order: Diet regular Room service appropriate?: Yes; Fluid consistency:: Thin Pt is also offered choice of unit snacks mid-morning and mid-afternoon.  Pt is eating as desired.   If additional nutrition issues arise, please consult RD.  Shannon Fairbank, MS, RD, LDN Pager: [phone] After Hours Pager: [phone]     

## 2015-10-31 NOTE — Plan of Care (Signed)
Problem: Diagnosis: Increased Risk For Suicide Attempt Goal: STG-Patient Will Attend All Groups On The Unit Outcome: Progressing Pt attending nursing group on the unit.      

## 2015-10-31 NOTE — Tx Team (Addendum)
Interdisciplinary Treatment Plan Update (Adult)  Date:  10/31/2015  Time Reviewed:  9:03 AM   Progress in Treatment: Attending groups: Yes Participating in groups:  Yes Taking medication as prescribed:  Yes. Tolerating medication:  Yes. Family/Significant othe contact made:  No, patient declined collateral contact Patient understands diagnosis:  Yes. and As evidenced by:  seeking treatment for SI, depression, etoh abuse, and medication stabilization Discussing patient identified problems/goals with staff:  Yes. Medical problems stabilized or resolved:  Yes. Denies suicidal/homicidal ideation: Yes, denies Issues/concerns per patient self-inventory:  Other:   Discharge Plan or Barriers: Will stay with mother in [place] and follow up with outpatient services.   Reason for Continuation of Hospitalization: Depression Medication stabilization Withdrawal symptoms  SI  Comments:  Shannon Patel is an 34 y.o. female. Patient was brought into the ED because of intoxication and threats of suicide. According to previous notes the patient made threats to kill herself if she is unable to get her children back from DSS. Patient currently denies SI/HI, hallucinations, and other self injurious behaviors. Patient is tearful, hopeless, guilty, irritability, and despondent. Patient reports an unwillingness to live without her children. Patient reports that the case worker with DSS has recommended long-term treatment as an options for her children to return to her custody but the patient doubtful about getting all her children back. Patient reports drinking a fifth of fireball whiskey prior to this ED admission. Patient reports the longest abstinence is 3 months. Diagnosis: Substance Induced Mood disorder, Alcohol use, moderate  Estimated length of stay: Discharge anticipated for 11/01/15  New goal(s): to develop effective aftercare plan.   Additional Comments:  Patient and CSW reviewed pt's  identified goals and treatment plan. Patient verbalized understanding and agreed to treatment plan. CSW reviewed [facility] "Discharge Process and Patient Involvement" Form. Pt verbalized understanding of information provided and signed form.    Review of initial/current patient goals per problem list:  1. Goal(s): Patient will participate in aftercare plan  Met: Yes.   Target date: at discharge  As evidenced by: Patient will participate within aftercare plan AEB aftercare provider and housing plan at discharge being identified.  12/20: CSW assessing for appropriate referrals.  12/22: Goal met. Will stay with mother in [place] and follow up with outpatient services.   2. Goal (s): Patient will exhibit decreased depressive symptoms and suicidal ideations.  Met: Yes   Target date: at discharge  As evidenced by: Patient will utilize self rating of depression at 3 or below and demonstrate decreased signs of depression or be deemed stable for discharge by MD.  12/20: Pt reports depression is high. Passive SI. No Hi/AVH. Pt able to contract for safety on the unit.  12/21: Goal met. Patient rates depression at 1, denies SI.   3. Goal(s): Patient will demonstrate decreased signs of withdrawal due to substance abuse  Met:Yes  Target date:at discharge   As evidenced by: Patient will produce a CIWA/COWS score of 0, have stable vitals signs, and no symptoms of withdrawal.  12/20: Pt reports minimal withdrawal symptoms with CIWA score of 0 this morning and high sitting BP. Goal progressing.   12/22: Goal met. No withdrawal symptoms reported at this time per medical chart.    Attendees: Patient:   10/31/2015 8:24 AM   Family:   10/31/2015 8:24 AM   Physician:  Dr. Adele Schilder, MD 10/31/2015 8:24 AM   Nursing: Shannon Gammon, RN 10/31/2015 8:24 AM   Clinical Social Worker:  10/31/2015 8:24  AM   Clinical Social Worker: Erasmo Downer Karan Ramnauth LCSWA; Peri [facility] 10/31/2015  8:24 AM   Other:  Gerline Legacy Nurse Case Manager 10/31/2015 8:24 AM   Other:  10/31/2015 8:24 AM   Other:  Agustina Caroli, NP 10/31/2015 8:24 AM   Other:  10/31/2015 8:24 AM     Scribe for Treatment Team:   Tilden Fossa, MSW, [place] Worker [facility] [phone]

## 2015-10-31 NOTE — Progress Notes (Signed)
[facility] MD Progress Note  10/31/2015 2:26 PM Shannon Patel  MRN:  [id number]   Subjective:  My tremors are getting better but I still feel sad and depressed.   Objective: Patient seen chart reviewed.  She continued to endorse depression and anxiety symptoms.  She admitted poor sleep, racing thoughts and feeling nervous and anxious.  Her tremors are improved.  She's on Librium detox.  She stays to herself and she has limited participation in group.  She continues to have rumination about her past especially not having visitation to see her 37-year-old daughter.  She is easily tearful and crying.  She admitted passive and fleeting suicidal thoughts but denies any plan.  Principal Problem: Substance induced mood disorder (HCC), major depressive disorder, recurrent moderate Diagnosis:   Patient Active Problem List   Diagnosis Date Noted  . Substance induced mood disorder (HCC) [F19.94] 10/29/2015  . 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 Alcohol intoxication (HCC) [F10.129]   . Suicidal ideation [R45.851]    Total Time spent with patient: 20 minutes  Past Psychiatric History: See history and physical.  Past Medical History:  Past Medical History  Diagnosis Date  . Bipolar 1 disorder (HCC)    History reviewed. No pertinent past surgical history. Family History: History reviewed. No pertinent family history. Family Psychiatric  History: See history and physical. Social History:  History  Alcohol Use  . Yes     History  Drug Use Not on file    Social History   Social History  . Marital Status: Married    Spouse Name: N/A  . Number of Children: N/A  . Years of Education: N/A   Social History Main Topics  . Smoking status: Current Every Day Smoker -- 0.50 packs/day for 10 years    Types: Cigarettes  . Smokeless tobacco: None  . Alcohol Use: Yes  . Drug Use: None  . Sexual Activity: Not Asked   Other Topics  Concern  . 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 acetaminophen (TYLENOL) tablet 650 mg  650 mg Oral Q6H PRN Gena Fray, MD      . alum & mag hydroxide-simeth (MAALOX/MYLANTA) 200-200-20 MG/5ML suspension 30 mL  30 mL Oral Q4H PRN Gena Fray, MD      . chlordiazePOXIDE (LIBRIUM) capsule 25 mg  25 mg Oral Q6H PRN Kerry Hough, PA-C      . chlordiazePOXIDE (LIBRIUM) capsule 25 mg  25 mg Oral BH-qamhs Kerry Hough, PA-C       Followed by  . [START ON 11/02/2015] chlordiazePOXIDE (LIBRIUM) capsule 25 mg  25 mg Oral Daily Kerry Hough, PA-C      . FLUoxetine (PROZAC) capsule 20 mg  20 mg Oral Daily Cleotis Nipper, MD   20 mg at 10/31/15 0931  . hydrOXYzine (ATARAX/VISTARIL) tablet 25 mg  25 mg Oral Q6H PRN Kerry Hough, PA-C   25 mg at 10/28/15 2229  . loperamide (IMODIUM) capsule 2-4 mg  2-4 mg Oral PRN Kerry Hough, PA-C      . magnesium hydroxide (MILK OF MAGNESIA) suspension 30 mL  30 mL Oral Daily PRN Gena Fray, MD      . multivitamin with minerals tablet 1 tablet  1 tablet Oral Daily Kerry Hough, PA-C   1 tablet at 10/31/15 1610  . nicotine polacrilex (NICORETTE) gum 2 mg  2 mg Oral PRN Rachael Fee, MD   2 mg at 10/31/15 1257  . ondansetron (ZOFRAN-ODT) disintegrating tablet 4 mg  4 mg Oral Q6H PRN Kerry Hough, PA-C      . thiamine (B-1) injection 100 mg  100 mg Intramuscular Once Kerry Hough, PA-C   100 mg at 10/28/15 2100  . thiamine (VITAMIN B-1) tablet 100 mg  100 mg Oral Daily Kerry Hough, PA-C   100 mg at 10/31/15 9604  . traZODone (DESYREL) tablet 50 mg  50 mg Oral QHS Gena Fray, MD   50 mg at 10/30/15 2113    Lab Results: No results found for this or any previous visit (from the past 48 hour(s)).  Physical Findings: AIMS: Facial and Oral Movements Muscles of Facial Expression: None, normal Lips and Perioral Area: None, normal Jaw: None, normal Tongue: None, normal,Extremity Movements Upper (arms, wrists, hands, fingers): None, normal Lower (legs, knees, ankles, toes): None, normal, Trunk Movements Neck, shoulders, hips: None, normal, Overall Severity Severity of abnormal movements (highest score from questions above): None, normal Incapacitation due to abnormal movements: None, normal Patient's awareness of abnormal movements (rate only patient's report): No Awareness, Dental Status Current problems with teeth and/or dentures?: No Does patient usually wear dentures?: No  CIWA:  CIWA-Ar Total: 0 COWS:  COWS Total Score: 2  Musculoskeletal: Strength & Muscle Tone: within normal limits Gait & Station: normal Patient leans: N/A  Psychiatric Specialty Exam: Review of Systems  Musculoskeletal: Negative.   Skin: Negative for itching and rash.  Neurological: Negative for dizziness.  Psychiatric/Behavioral: Positive for depression. The patient is nervous/anxious and has insomnia.     Blood pressure 113/62, pulse 94, temperature 97.5 F (36.4 C), temperature source Oral, resp. rate 16, height  (1.6 m), weight 74.844 kg (165 lb), SpO2 99 %.Body mass index is 29.24 kg/(m^2).  General Appearance: Disheveled and Fairly Groomed  Patent attorney::  Minimal  Speech:  Slow  Volume:  Decreased  Mood:  Anxious, Depressed and Dysphoric  Affect:  Depressed  Thought  Process:  Intact  Orientation:  Full (Time, Place, and Person)  Thought Content:  Rumination  Suicidal Thoughts:  Yes.  without intent/plan  Homicidal Thoughts:  No  Memory:  Immediate;   Fair Recent;   Fair Remote;   Fair  Judgement:  Fair  Insight:  Fair  Psychomotor Activity:  Tremor  Concentration:  Fair  Recall:  [employer] of Knowledge:Fair  Language: Fair  Akathisia:  No  Handed:  Right  AIMS (if indicated):     Assets:  Communication Skills Desire for Improvement  ADL's:  Intact  Cognition: WNL  Sleep:  Number of Hours: 6   Treatment Plan Summary: Daily contact with patient to assess and evaluate symptoms and progress in treatment and  Medication management  -Continue detox protocol as patient to help withdrawal symptoms.   -Patient remains depressed and sad, We'll start Prozac 20 mg daily to help the depression and anxiety symptoms.   -Encouraged to participate in group milieu therapy.  -Reviwed  blood work results.  Blood alcohol level 249, pregnancy test negative, UDS negative, chemistry shows potassium 3.4 and glucose 132 and CBC shows platelets 492.   ARFEEN,SYED T. 10/31/2015, 2:26 PM

## 2015-10-31 NOTE — [facility] Group Notes (Signed)
[facility] LCSW Group Therapy 10/31/2015  1:15 pm   Type of Therapy: Group Therapy Participation Level: Active  Participation Quality: Attentive, Sharing and Supportive  Affect: Appropriate  Cognitive: Alert and Oriented  Insight: Developing/Improving and Engaged  Engagement in Therapy: Developing/Improving and Engaged  Modes of Intervention: Clarification, Confrontation, Discussion, Education, Exploration, Limit-setting, Orientation, Problem-solving, Rapport Building, Dance movement psychotherapisteality Testing, Socialization and Support  Summary of Progress/Problems: The topic for group was balance in life. Today's group focused on defining balance in one's own words, identifying things that can knock one off balance, and exploring healthy ways to maintain balance in life. Group members were asked to provide an example of a time when they felt off balance, describe how they handled that situation,and process healthier ways to regain balance in the future. Group members were asked to share the most important tool for maintaining balance that [facility] and how they plan to apply this method after discharge. Patient identified family and getting connected with community resources including support groups as priorities. CSW and other group members provided patient with emotional support and encouragment.   Samuella BruinKristin Jordan Caraveo, MSW, [employer]CSWA Clinical Social Worker [facility] [phone]

## 2015-10-31 NOTE — [facility] Group Notes (Signed)
[facility] Group Notes:  (Nursing/MHT/Case Management/Adjunct)  Date:  10/31/2015  Time:  0915  Type of Therapy:  Nurse Education  Participation Level:  Active  Participation Quality:  Appropriate  Affect:  Appropriate  Cognitive:  Alert and Appropriate  Insight:  Appropriate and Good  Engagement in Group:  Supportive  Modes of Intervention:  Activity, Clarification, Socialization and Support  Summary of Progress/Problems:  Dara Hoyershley N Kalid Ghan 10/31/2015, 10:33 AM

## 2015-10-31 NOTE — Progress Notes (Signed)
Pt reports she is somewhat more anxious today than yesterday, but still had a good day.  She denies SI/HI/AVH.  She reports that she may be discharged on Friday.  She would like to see her children for Christmas, but does not know if that will happen.  She is hopeful to stay clean and sober, and knows it will take effort on her part.  Pt has been pleasant and cooperative on the unit.  Pt makes her needs known to staff.  Support and encouragement offered.  Safety maintained with q15 minute checks.

## 2015-10-31 NOTE — Progress Notes (Signed)
Adult Psychoeducational Group Note  Date:  10/31/2015 Time:  9:40 PM  Group Topic/Focus:  Wrap-Up Group:   The focus of this group is to help patients review their daily goal of treatment and discuss progress on daily workbooks.  Participation Level:  Active  Participation Quality:  Appropriate  Affect:  Appropriate  Cognitive:  Appropriate  Insight: Appropriate  Engagement in Group:  Engaged  Modes of Intervention:  Discussion  Additional Comments:  Patient stated her goal for today was to not be depressed and to take it one day at a time. Patient stated something positive that happened today was her waking up this morning.  Harmonie Verrastro L Arden Tinoco 10/31/2015, 9:40 PM

## 2015-11-01 MED ORDER — FLUOXETINE HCL 20 MG PO CAPS: 20.0000 mg | ORAL_CAPSULE | Freq: Every day | ORAL | Status: DC

## 2015-11-01 MED ORDER — TRAZODONE HCL 50 MG PO TABS: 50.0000 mg | ORAL_TABLET | Freq: Every day | ORAL | Status: DC

## 2015-11-01 MED ORDER — NICOTINE POLACRILEX 2 MG MT GUM: 2.0000 mg | CHEWING_GUM | OROMUCOSAL | Status: DC | PRN

## 2015-11-01 NOTE — Plan of Care (Signed)
Problem: Diagnosis: Increased Risk For Suicide Attempt Goal: LTG-Patient Will Report Improved Mood and Deny Suicidal LTG (by discharge) Patient will report improved mood and deny suicidal ideation.  Outcome: Completed/Met Date Met:  11/01/15 Pt denies suicidal ideation for the last two days.

## 2015-11-01 NOTE — [facility] Suicide Risk Assessment (Signed)
[facility]   Demographic Factors:  Caucasian  Total Time spent with patient: 30 minutes  Musculoskeletal: Strength & Muscle Tone: within normal limits Gait & Station: normal Patient leans: N/A  Psychiatric Specialty Exam: Physical Exam  ROS  Blood pressure 122/67, pulse 91, temperature 97.6 F (36.4 C), temperature source Oral, resp. rate 16, height 5\' 3"  (1.6 m), weight 74.844 kg (165 lb), SpO2 99 %.Body mass index is 29.24 kg/(m^2).  General Appearance: Casual  Eye Contact::  Good  Speech:  Normal Rate409  Volume:  Normal  Mood:  Euthymic  Affect:  Appropriate  Thought Process:  Coherent  Orientation:  Full (Time, Place, and Person)  Thought Content:  WDL  Suicidal Thoughts:  No  Homicidal Thoughts:  No  Memory:  Immediate;   Good Recent;   Good Remote;   Good  Judgement:  Good  Insight:  Good  Psychomotor Activity:  Normal  Concentration:  Good  Recall:  Good  Fund of Knowledge:Good  Language: Good  Akathisia:  No  Handed:  Right  AIMS (if indicated):     Assets:  Communication Skills Desire for Improvement Financial Resources/Insurance Housing Physical Health Social Support  Sleep:  Number of Hours: 6.75  Cognition: WNL  ADL's:  Intact   Have you used any form of tobacco in the last 30 days? (Cigarettes, Smokeless Tobacco, Cigars, and/or Pipes): Yes  Has this patient used any form of tobacco in the last 30 days? (Cigarettes, Smokeless Tobacco, Cigars, and/or Pipes) Yes, Prescription not provided because: Patient refuses to quit smoking  Mental Status Per Nursing Assessment::   On Admission:     Current Mental Status by Physician: See above  Loss Factors: NA  Historical Factors: Impulsivity  Risk Reduction Factors:   Responsible for children under 34 years of age, Sense of responsibility to family, Religious beliefs about death, Living with another person, especially a relative, Positive social support, Positive  therapeutic relationship and Positive coping skills or problem solving skills  Continued Clinical Symptoms:  Alcohol/Substance Abuse/Dependencies Unstable or Poor Therapeutic Relationship  Cognitive Features That Contribute To Risk:  None    Suicide Risk:  Minimal: No identifiable suicidal ideation.  Patients presenting with no risk factors but with morbid ruminations; may be classified as minimal risk based on the severity of the depressive symptoms  Principal Problem: Substance induced mood disorder [facility](HCC) Discharge Diagnoses:  Patient Active Problem List   Diagnosis Date Noted  . Substance induced mood disorder (HCC) [F19.94] 10/29/2015  . 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 Alcohol intoxication (HCC) [F10.129]   . Suicidal ideation [R45.851]     Follow-up Information    Follow up with [facility].   Why:  Walk-in clinic Monday-Friday between 8am to 4pm for assessment for therapy and medication management services. Please arrive early in the morning in order to be seen as soon as possible.   Contact information:   [address],  [place], [place] [address] Phone: [phone] Fax:  [phone]      Plan Of Care/Follow-up recommendations:  Activity:  As tolerated Diet:  Unchanged from the past  Is patient on multiple antipsychotic therapies at discharge:  No   Has Patient had three or more failed trials of antipsychotic monotherapy by history:  No  Recommended Plan for Multiple Antipsychotic Therapies: NA    Deetta Siegmann T. 11/01/2015, 10:41 AM

## 2015-11-01 NOTE — Plan of Care (Signed)
Problem: Diagnosis: Increased Risk For Suicide Attempt Goal: LTG-Patient Will Show Positive Response to Medication LTG (by discharge) : Patient will show positive response to medication and will participate in the development of the discharge plan.  Outcome: Completed/Met Date Met:  11/01/15 Pt feels her prescribed medications are working and helping lift her depression.  She plans to discharge to her mother's home.

## 2015-11-01 NOTE — Discharge Summary (Signed)
Physician Discharge Summary Note  Patient:  Shannon Patel is an 34 y.o., female MRN:  [id number] DOB:  [birth year] Patient phone:  [phone] (home)  Patient address:   [address] [place] [address],  Total Time spent with patient: Greater than 30 minutes  Date of Admission:  10/28/2015 Date of Discharge: 11-01-15  Reason for Admission:    Principal Problem: Substance induced mood disorder [facility](HCC) Discharge Diagnoses: Patient Active Problem List   Diagnosis Date Noted  . Substance induced mood disorder (HCC) [F19.94] 10/29/2015  . 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 Alcohol intoxication (HCC) [F10.129]   . Suicidal ideation [R45.851]     Past Psychiatric History: Alcohol dependence, polysubstance dependence  Past Medical History:  Past Medical History  Diagnosis Date  . Bipolar 1 disorder (HCC)    History reviewed. No pertinent past surgical history.  Family History: History reviewed. No pertinent family history.  Family Psychiatric  History: See H&P  Social History:  History  Alcohol Use  . Yes     History  Drug Use Not on file    Social History   Social History  . Marital Status: Married    Spouse Name: N/A  . Number of Children: N/A  . Years of Education: N/A   Social History Main Topics  . Smoking status: Current Every Day Smoker -- 0.50 packs/day for 10 years    Types: Cigarettes  . Smokeless tobacco: None  . Alcohol Use: Yes  . Drug Use: None  . Sexual Activity: Not Asked   Other Topics Concern  . None   Social History Narrative   Hospital Course:  Patient is a 34 year old divorced Caucasian unemployed female who [facility] due to severe depression and having suicidal thoughts to hurt herself. She is experiencing severe depression for past 2 weeks when she find out that she lost her visitation to see her 34-year-old daughter who is  living with foster parents. Patient relapsed into drinking and she admitted drinking every day to cope with her depression. She endorse anhedonia, feeling of hopelessness and worthlessness. Her blood alcohol level is 249. She has mild tremors and shakes. Patient has history of drinking alcohol and using drugs. In the past she has used opiates and pain medications. However her drug of choice is alcohol. In the past she has used crystal meth and require [facility].  Shannon Patel was admitted to the hospital with a BAL of 249.  She she did admit having problem with alcohol abuse. However, blamed her recent drinking problems on the fact that she lost the visitation right of her grand-daughter. She was also endorsing worsening symptoms of depression leading to suicidal ideations. She required alcohol detox as well as mood stabilization treatments. Shannon Patel received Librium detoxification treatment protocols for alcohol detox. She was also enrolled and participated in the group counseling sessions & AA/NA meetings being offered and held on this unit. She learned coping skills.  Besides the detox treatment, Shannon Patel was medicated & discharged on Prozac 20 mg for depression &Trazodone 50 mg Q bedtime for sleep. She tolerated her treatment regimen without any adverse effects. Shannon Patel has completed detox treatment and her mood is stable. This is evidenced by her reports of improved mood & absence of substance withdrawal symptoms. She is currently being discharged to continue psychiatric treatment as noted below. Upon discharge, she adamantly denies any SIHI, AVH, delusional thoughts, paranoia and or substance  withdrawal symptoms. She received from [facility] pharmacy, a 7 days worth, supply samples of her [facility] discharge medications. She left with all belongings in no apparent distress. Transportation per patient's arrangement.  Physical Findings: AIMS: Facial and Oral Movements Muscles of Facial  Expression: None, normal Lips and Perioral Area: None, normal Jaw: None, normal Tongue: None, normal,Extremity Movements Upper (arms, wrists, hands, fingers): None, normal Lower (legs, knees, ankles, toes): None, normal, Trunk Movements Neck, shoulders, hips: None, normal, Overall Severity Severity of abnormal movements (highest score from questions above): None, normal Incapacitation due to abnormal movements: None, normal Patient's awareness of abnormal movements (rate only patient's report): No Awareness, Dental Status Current problems with teeth and/or dentures?: No Does patient usually wear dentures?: No  CIWA:  CIWA-Ar Total: 0 COWS:  COWS Total Score: 2  Musculoskeletal: Strength & Muscle Tone: within normal limits Gait & Station: normal Patient leans: N/A  Psychiatric Specialty Exam: Review of Systems  Constitutional: Negative.   HENT: Negative.   Eyes: Negative.   Respiratory: Negative.   Cardiovascular: Negative.   Gastrointestinal: Negative.   Genitourinary: Negative.   Skin: Negative.   Endo/Heme/Allergies: Negative.   Psychiatric/Behavioral: Positive for depression (Stable) and substance abuse (Polysubstance dependence). Negative for suicidal ideas, hallucinations and memory loss. The patient has insomnia (Stable). The patient is not nervous/anxious.     Blood pressure 122/67, pulse 91, temperature 97.6 F (36.4 C), temperature source Oral, resp. rate 16, height  (1.6 m), weight 74.844 kg (165 lb), SpO2 99 %.Body mass index is 29.24 kg/(m^2).  See Md's SRA   Have you used any form of tobacco in the last 30 days? (Cigarettes, Smokeless Tobacco, Cigars, and/or Pipes): Yes  Has this patient used any form of tobacco in the last 30 days? (Cigarettes, Smokeless Tobacco, Cigars, and/or Pipes) Yes, Yes, A prescription for an FDA-approved tobacco cessation medication was offered at discharge and the patient refused  Metabolic Disorder Labs:  No results found for:  HGBA1C, MPG No results found for: PROLACTIN No results found for: CHOL, TRIG, HDL, CHOLHDL, VLDL, LDLCALC  See Psychiatric Specialty Exam and Suicide Risk Assessment completed by Attending Physician prior to discharge.  Discharge destination:  Home  Is patient on multiple antipsychotic therapies at discharge:  No   Has Patient had three or more failed trials of antipsychotic monotherapy by history:  No  Recommended Plan for Multiple Antipsychotic Therapies: NA    Medication List    TAKE these medications      Indication   FLUoxetine 20 MG capsule  Commonly known as:  PROZAC  Take 1 capsule (20 mg total) by mouth daily. For depression   Indication:  Major Depressive Disorder     nicotine polacrilex 2 MG gum  Commonly known as:  NICORETTE  Take 1 each (2 mg total) by mouth as needed for smoking cessation.   Indication:  Nicotine Addiction     traZODone 50 MG tablet  Commonly known as:  DESYREL  Take 1 tablet (50 mg total) by mouth at bedtime. For sleep   Indication:  Trouble Sleeping       Follow-up Information    Follow up with [facility].   Why:  Patient can utilize Open Access process at this provider.  Please present within 7 days of discharge in order to access services w this provider.   Contact information:   [address]  [place], [place] [address] Phone: [phone] Fax:  [phone]     Follow-up recommendations: Activity:  As  tolerated Diet: As recommended by your primary care doctor. Keep all scheduled follow-up appointments as recommended.  Comments: Take all your medications as prescribed by your mental healthcare provider. Report any adverse effects and or reactions from your medicines to your outpatient provider promptly. Patient is instructed and cautioned to not engage in alcohol and or illegal drug use while on prescription medicines. In the event of worsening symptoms, patient is instructed to call the crisis hotline, 911 and or go to the  nearest ED for appropriate evaluation and treatment of symptoms. Follow-up with your primary care provider for your other medical issues, concerns and or health care needs.   Signed: Sanjuana Kava, PMHNP, FNP-BC 11/01/2015, 10:11 AM   Patient seen face to face for psychiatric evaluation. Chart reviewed and finding discussed with Physician extender. Agreed with disposition and treatment plan.   Kathryne Sharper, MD

## 2015-11-01 NOTE — Progress Notes (Signed)
  [facility] :  Will you be returning to the same living situation after discharge:  No. Patient plans to stay with mother at discharge At discharge, do you have transportation home?: Yes,  patient reports access to transportation Do you have the ability to pay for your medications: Yes,  patient will be provided with prescriptions at discharge  Release of information consent forms completed and in the chart;  Patient's signature needed at discharge.  Patient to Follow up at: Follow-up Information    Follow up with [facility].   Why:  Walk-in clinic Monday-Friday between 8am to 4pm for assessment for therapy and medication management services. Please arrive early in the morning in order to be seen as soon as possible.   Contact information:   [address],  [place], [place] [address] Phone: [phone] Fax:  [phone]      Next level of care provider has access to [facility] Link:no  Safety Planning and Suicide Prevention discussed: Yes,  with patient  Have you used any form of tobacco in the last 30 days? (Cigarettes, Smokeless Tobacco, Cigars, and/or Pipes): Yes  Has patient been referred to the Quitline?: Patient refused referral  Patient has been referred for addiction treatment: Yes- outpatient  Lavonne Kinderman, [place] CarboKristin L 11/01/2015, 10:23 AM

## 2015-11-01 NOTE — Progress Notes (Signed)
Discharge Note: Discharge instructions/medications/follow up appointments discussed with pt. Prescriptions given, samples given, and patients belongings returned to pt. Pt states she was going to live with her mother and work on getting custody of her children back.  Pt verbalizes understanding. Transporting by brother.  Pt denies SI/HI/AVH.

## 2015-11-01 NOTE — [facility] Group Notes (Signed)
   [facility] Note  11/01/2015  8:45 AM   Participation Quality: Alert, Appropriate and Oriented  Mood/Affect: Appropriate  Depression Rating: 0  Anxiety Rating: 0  Thoughts of Suicide: Pt denies SI/HI  Will you contract for safety? Yes  Current AVH: Pt denies  Plan for Discharge/Comments: Pt attended discharge planning group and actively participated in group. CSW provided pt with today's workbook. Patient plans to stay with her mother at discharge and follow up with outpatient services.   Transportation Means: CSW continuing to assess  Supports: No supports mentioned at this time  Samuella BruinKristin Ivette Castronova, MSW, [employer]CSWA Clinical Social Worker [facility] [phone]

## 2016-07-01 ENCOUNTER — Emergency Department
Admission: EM | Admit: 2016-07-01 | Discharge: 2016-07-01 | Disposition: A | Discharge status: Home / Self Care | Payer: Medicaid Other | Attending: Emergency Medicine | Admitting: Emergency Medicine

## 2016-07-01 ENCOUNTER — Encounter: Payer: Self-pay | Admitting: Medical Oncology

## 2016-07-01 DIAGNOSIS — F17210 Nicotine dependence, cigarettes, uncomplicated: Secondary | ICD-10-CM | POA: Insufficient documentation

## 2016-07-01 DIAGNOSIS — L089 Local infection of the skin and subcutaneous tissue, unspecified: Secondary | ICD-10-CM | POA: Insufficient documentation

## 2016-07-01 DIAGNOSIS — B9689 Other specified bacterial agents as the cause of diseases classified elsewhere: Secondary | ICD-10-CM

## 2016-07-01 MED ORDER — SULFAMETHOXAZOLE-TRIMETHOPRIM 800-160 MG PO TABS: 1.0000 | ORAL_TABLET | Freq: Two times a day (BID) | ORAL | 0 refills | Status: DC

## 2016-07-01 MED ORDER — TRAMADOL HCL 50 MG PO TABS: 50.0000 mg | ORAL_TABLET | Freq: Four times a day (QID) | ORAL | 0 refills | Status: DC | PRN

## 2016-07-01 NOTE — Discharge Instructions (Signed)
Keep area clean and dry. Advised to apply Band-Aids over the area to avoid further contamination. Wash area with antibacterial soap and take medication as directed.

## 2016-07-01 NOTE — ED Provider Notes (Signed)
[facility] Emergency Department Provider Note   ____________________________________________   None    (approximate)  I have reviewed the triage vital signs and the nursing notes.   HISTORY  Chief Complaint Rash and Abscess    HPI Shannon MayoRhonda G Sproull is a [age] y.o. female patient complaining of multiple abscesses on the left upper leg with 2 satellite lesions on the right upper leg. Patient is concerned because she had a procedure to cover up a tattoo defect on her back. Patient states today after the procedure she notices the redness and bumps on her legs. Patient rates the pain as 8/10. No palliative measures taken for this complaint. Patient describes her pain as achy.  Past Medical History:  Diagnosis Date  . Bipolar 1 disorder [facility](HCC)     Patient Active Problem List   Diagnosis Date Noted  . Substance induced mood disorder (HCC) 10/29/2015  . 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 Alcohol intoxication (HCC)   . Suicidal ideation     History reviewed. No pertinent surgical history.  Prior to Admission medications   Medication Sig Start Date End Date Taking? Authorizing Provider  FLUoxetine (PROZAC) 20 MG capsule Take 1 capsule (20 mg total) by mouth daily. For depression 11/01/15   Sanjuana KavaAgnes I Nwoko, NP  nicotine polacrilex (NICORETTE) 2 MG gum Take 1 each (2 mg total) by mouth as needed for smoking cessation. 11/01/15   Sanjuana KavaAgnes I Nwoko, NP  sulfamethoxazole-trimethoprim (BACTRIM DS,SEPTRA DS) 800-160 MG tablet Take 1 tablet by mouth 2 (two) times daily. 07/01/16   Joni Reiningonald K Terria Deschepper, PA-C  traMADol (ULTRAM) 50 MG tablet Take 1 tablet (50 mg total) by mouth every 6 (six) hours as needed for moderate pain. 07/01/16   Joni Reiningonald K Myrth Dahan, PA-C  traZODone (DESYREL) 50 MG tablet Take 1 tablet (50 mg total) by mouth at bedtime. For sleep 11/01/15   Sanjuana KavaAgnes I Nwoko, NP    Allergies Review of patient's allergies  indicates no known allergies.  No family history on file.  Social History Social History  Substance Use Topics  . Smoking status: Current Every Day Smoker    Packs/day: 0.50    Years: 10.00    Types: Cigarettes  . Smokeless tobacco: Not on file  . Alcohol use Yes    Review of Systems Constitutional: No fever/chills Eyes: No visual changes. ENT: No sore throat. Cardiovascular: Denies chest pain. Respiratory: Denies shortness of breath. Gastrointestinal: No abdominal pain.  No nausea, no vomiting.  No diarrhea.  No constipation. Genitourinary: Negative for dysuria. Musculoskeletal: Negative for back pain. Skin: Positive for rash. Neurological: Negative for headaches, focal weakness or numbness. Psychiatric:Bipolar  ____________________________________________   PHYSICAL EXAM:  VITAL SIGNS: ED Triage Vitals [07/01/16 1447]  Enc Vitals Group     BP 132/76     Pulse Rate 95     Resp 17     Temp 98.1 F (36.7 C)     Temp Source Oral     SpO2 99 %     Weight 185 lb (83.9 kg)     Height 5\' 3"  (1.6 m)     Head Circumference      Peak Flow      Pain Score 8     Pain Loc      Pain Edu?      Excl. in GC?     Constitutional: Alert and oriented. Well appearing and in no acute distress. Eyes: Conjunctivae are normal.  PERRL. EOMI. Head: Atraumatic. Nose: No congestion/rhinnorhea. Mouth/Throat: Mucous membranes are moist.  Oropharynx non-erythematous. Neck: No stridor.  No cervical spine tenderness to palpation. Hematological/Lymphatic/Immunilogical: No cervical lymphadenopathy. Cardiovascular: Normal rate, regular rhythm. Grossly normal heart sounds.  Good peripheral circulation. Respiratory: Normal respiratory effort.  No retractions. Lungs CTAB. Gastrointestinal: Soft and nontender. No distention. No abdominal bruits. No CVA tenderness. Musculoskeletal: No lower extremity tenderness nor edema.  No joint effusions. Neurologic:  Normal speech and language. No gross  focal neurologic deficits are appreciated. No gait instability. Skin:  Skin is warm, dry and intact. Papular lesions on erythematous base. A few of the lesions are crusted over. Psychiatric: Mood and affect are normal. Speech and behavior are normal.  ____________________________________________   LABS (all labs ordered are listed, but only abnormal results are displayed)  Labs Reviewed - No data to display ____________________________________________  EKG   ____________________________________________  RADIOLOGY   ____________________________________________   PROCEDURES  Procedure(s) performed: None  Procedures  Critical Care performed: No  ____________________________________________   INITIAL IMPRESSION / ASSESSMENT AND PLAN / ED COURSE  Pertinent labs & imaging results that were available during my care of the patient were reviewed by me and considered in my medical decision making (see chart for details).  Bacterial skin infection. Patient given discharge care instructions. Patient advised to avoid shaving area into the [place] completely healed over. Patient given a prescription for Bactrim DS and tramadol.  Clinical Course     ____________________________________________   FINAL CLINICAL IMPRESSION(S) / ED DIAGNOSES  Final diagnoses:  Superficial bacterial infection of skin      NEW MEDICATIONS STARTED DURING THIS VISIT:  New Prescriptions   SULFAMETHOXAZOLE-TRIMETHOPRIM (BACTRIM DS,SEPTRA DS) 800-160 MG TABLET    Take 1 tablet by mouth 2 (two) times daily.   TRAMADOL (ULTRAM) 50 MG TABLET    Take 1 tablet (50 mg total) by mouth every 6 (six) hours as needed for moderate pain.     Note:  This document was prepared using Dragon voice recognition software and may include unintentional dictation errors.    Joni ReiningRonald K Jye Fariss, PA-C 07/01/16 1605    Loleta Roseory Forbach, MD 07/01/16 [phone]

## 2016-07-01 NOTE — ED Triage Notes (Signed)
Pt has abscessed areas to left upper leg.

## 2016-07-30 ENCOUNTER — Emergency Department
Admission: EM | Admit: 2016-07-30 | Discharge: 2016-07-30 | Disposition: A | Discharge status: Home / Self Care | Payer: Medicaid Other | Attending: Emergency Medicine | Admitting: Emergency Medicine

## 2016-07-30 ENCOUNTER — Encounter: Payer: Self-pay | Admitting: Psychiatry

## 2016-07-30 ENCOUNTER — Encounter: Payer: Self-pay | Admitting: Emergency Medicine

## 2016-07-30 ENCOUNTER — Inpatient Hospital Stay
Admission: RE | Admit: 2016-07-30 | Discharge: 2016-08-03 | DRG: 882 | Disposition: A | Discharge status: Home / Self Care | Payer: Federal, State, Local not specified - Other | Source: Intra-hospital | Attending: Psychiatry | Admitting: Psychiatry

## 2016-07-30 DIAGNOSIS — F339 Major depressive disorder, recurrent, unspecified: Secondary | ICD-10-CM | POA: Diagnosis present

## 2016-07-30 DIAGNOSIS — R45851 Suicidal ideations: Secondary | ICD-10-CM | POA: Diagnosis present

## 2016-07-30 DIAGNOSIS — F918 Other conduct disorders: Secondary | ICD-10-CM | POA: Insufficient documentation

## 2016-07-30 DIAGNOSIS — F17200 Nicotine dependence, unspecified, uncomplicated: Secondary | ICD-10-CM | POA: Diagnosis present

## 2016-07-30 DIAGNOSIS — Z716 Tobacco abuse counseling: Secondary | ICD-10-CM

## 2016-07-30 DIAGNOSIS — F4325 Adjustment disorder with mixed disturbance of emotions and conduct: Principal | ICD-10-CM | POA: Diagnosis present

## 2016-07-30 DIAGNOSIS — Z79899 Other long term (current) drug therapy: Secondary | ICD-10-CM

## 2016-07-30 DIAGNOSIS — F29 Unspecified psychosis not due to a substance or known physiological condition: Secondary | ICD-10-CM | POA: Diagnosis present

## 2016-07-30 DIAGNOSIS — F17210 Nicotine dependence, cigarettes, uncomplicated: Secondary | ICD-10-CM | POA: Insufficient documentation

## 2016-07-30 DIAGNOSIS — R4689 Other symptoms and signs involving appearance and behavior: Secondary | ICD-10-CM

## 2016-07-30 LAB — CBC
HCT: 44.7 % (ref 35.0–47.0)
HEMOGLOBIN: 15 g/dL (ref 12.0–16.0)
MCH: 28.4 pg (ref 26.0–34.0)
MCHC: 33.5 g/dL (ref 32.0–36.0)
MCV: 84.8 fL (ref 80.0–100.0)
Platelets: 437 10*3/uL (ref 150–440)
RBC: 5.27 MIL/uL — AB (ref 3.80–5.20)
RDW: 13 % (ref 11.5–14.5)
WBC: 12.6 10*3/uL — AB (ref 3.6–11.0)

## 2016-07-30 LAB — COMPREHENSIVE METABOLIC PANEL
ALBUMIN: 4.3 g/dL (ref 3.5–5.0)
ALT: 13 U/L — AB (ref 14–54)
AST: 16 U/L (ref 15–41)
Alkaline Phosphatase: 73 U/L (ref 38–126)
Anion gap: 11 (ref 5–15)
BUN: 11 mg/dL (ref 6–20)
CHLORIDE: 104 mmol/L (ref 101–111)
CO2: 24 mmol/L (ref 22–32)
Calcium: 9.4 mg/dL (ref 8.9–10.3)
Creatinine, Ser: 0.65 mg/dL (ref 0.44–1.00)
GFR calc Af Amer: >60 mL/min (ref >60)
GLUCOSE: 112 mg/dL — AB (ref 65–99)
POTASSIUM: 2.9 mmol/L — AB (ref 3.5–5.1)
SODIUM: 139 mmol/L (ref 135–145)
TOTAL PROTEIN: 8.3 g/dL — AB (ref 6.5–8.1)
Total Bilirubin: 0.7 mg/dL (ref 0.3–1.2)

## 2016-07-30 LAB — ETHANOL

## 2016-07-30 LAB — ACETAMINOPHEN LEVEL

## 2016-07-30 LAB — SALICYLATE LEVEL

## 2016-07-30 MED ORDER — MAGNESIUM HYDROXIDE 400 MG/5ML PO SUSP: 30.0000 mL | Freq: Every day | ORAL | Status: DC | PRN

## 2016-07-30 MED ORDER — FLUOXETINE HCL 20 MG PO CAPS: 20.0000 mg | ORAL_CAPSULE | Freq: Every day | ORAL | Status: DC

## 2016-07-30 MED ORDER — HALOPERIDOL 0.5 MG PO TABS
1.0000 mg | ORAL_TABLET | Freq: Four times a day (QID) | ORAL | Status: DC | PRN
  Administered 2016-07-30: 1 mg via ORAL
  Filled 2016-07-30: qty 2

## 2016-07-30 MED ORDER — TRAZODONE HCL 100 MG PO TABS
100.0000 mg | ORAL_TABLET | Freq: Every evening | ORAL | Status: DC | PRN
  Administered 2016-07-30 – 2016-07-31 (×2): 100 mg via ORAL
  Filled 2016-07-30 (×3): qty 1

## 2016-07-30 MED ORDER — FLUOXETINE HCL 20 MG PO CAPS
ORAL_CAPSULE | ORAL | Status: AC
  Filled 2016-07-30: qty 1

## 2016-07-30 MED ORDER — IBUPROFEN 600 MG PO TABS
600.0000 mg | ORAL_TABLET | Freq: Once | ORAL | Status: AC
  Administered 2016-07-30: 600 mg via ORAL
  Filled 2016-07-30: qty 1

## 2016-07-30 MED ORDER — TRAZODONE HCL 100 MG PO TABS: 100.0000 mg | ORAL_TABLET | Freq: Every evening | ORAL | Status: DC | PRN

## 2016-07-30 MED ORDER — FLUOXETINE HCL 20 MG PO CAPS
20.0000 mg | ORAL_CAPSULE | Freq: Every day | ORAL | Status: DC
  Administered 2016-07-31: 20 mg via ORAL
  Filled 2016-07-30: qty 1

## 2016-07-30 MED ORDER — ACETAMINOPHEN 325 MG PO TABS
650.0000 mg | ORAL_TABLET | Freq: Four times a day (QID) | ORAL | Status: DC | PRN
  Administered 2016-08-01: 650 mg via ORAL
  Filled 2016-07-30: qty 2

## 2016-07-30 MED ORDER — HALOPERIDOL 1 MG PO TABS
1.0000 mg | ORAL_TABLET | Freq: Four times a day (QID) | ORAL | Status: DC | PRN
  Filled 2016-07-30: qty 1

## 2016-07-30 MED ORDER — ALUM & MAG HYDROXIDE-SIMETH 200-200-20 MG/5ML PO SUSP: 30.0000 mL | ORAL | Status: DC | PRN

## 2016-07-30 NOTE — ED Notes (Addendum)
Received haldol; added to pt specific area in pyxis

## 2016-07-30 NOTE — ED Provider Notes (Signed)
[facility] Emergency Department Provider Note   ____________________________________________   First MD Initiated Contact with Patient 07/30/16 1020     (approximate)  I have reviewed the triage vital signs and the nursing notes.   HISTORY  Chief Complaint Altered Mental Status   HPI Shannon Patel is a 35 y.o. female with a history of bipolar disorder who is presenting to the emergency department today under involuntary commitment by her mother. Per the patient, she says "I have psychiatric problems." However, she is not forthcoming with details of her psychiatric issues. She says that she has not used alcohol in a month and says that she last used a recreational drug 3 days ago but she will not tell me what it is. She says that she does not use any injection drugs. Involuntary commitment paperwork expresses concern for the patient's recent aggressive behavior as well as hyperreligious speech in addition to possible instigator's of losing permanent custody of her children.She denies any suicidal or homicidal ideation. Denies any suicide attempts. She does not report any hallucinations.   Past Medical History:  Diagnosis Date  . Bipolar 1 disorder [facility](HCC)     Patient Active Problem List   Diagnosis Date Noted  . Substance induced mood disorder (HCC) 10/29/2015  . 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 Alcohol intoxication (HCC)   . Suicidal ideation     No past surgical history on file.  Prior to Admission medications   Medication Sig Start Date End Date Taking? Authorizing Provider  FLUoxetine (PROZAC) 20 MG capsule Take 1 capsule (20 mg total) by mouth daily. For depression 11/01/15   Sanjuana KavaAgnes I Nwoko, NP  nicotine polacrilex (NICORETTE) 2 MG gum Take 1 each (2 mg total) by mouth as needed for smoking cessation. 11/01/15   Sanjuana KavaAgnes I Nwoko, NP  sulfamethoxazole-trimethoprim (BACTRIM DS,SEPTRA  DS) 800-160 MG tablet Take 1 tablet by mouth 2 (two) times daily. 07/01/16   Joni Reiningonald K Smith, PA-C  traMADol (ULTRAM) 50 MG tablet Take 1 tablet (50 mg total) by mouth every 6 (six) hours as needed for moderate pain. 07/01/16   Joni Reiningonald K Smith, PA-C  traZODone (DESYREL) 50 MG tablet Take 1 tablet (50 mg total) by mouth at bedtime. For sleep 11/01/15   Sanjuana KavaAgnes I Nwoko, NP    Allergies Review of patient's allergies indicates no known allergies.  No family history on file.  Social History Social History  Substance Use Topics  . Smoking status: Current Every Day Smoker    Packs/day: 0.50    Years: 10.00    Types: Cigarettes  . Smokeless tobacco: Not on file  . Alcohol use Yes     Comment: No use in 30 days    Review of Systems Constitutional: No fever/chills Eyes: No visual changes. ENT: No sore throat. Cardiovascular: Denies chest pain. Respiratory: Denies shortness of breath. Gastrointestinal: No abdominal pain.  No nausea, no vomiting.  No diarrhea.  No constipation. Genitourinary: Negative for dysuria. Musculoskeletal: Negative for back pain. Skin: Negative for rash. Neurological: Negative for headaches, focal weakness or numbness.  10-point ROS otherwise negative.  ____________________________________________   PHYSICAL EXAM:  VITAL SIGNS: ED Triage Vitals  Enc Vitals Group     BP 07/30/16 0957 126/79     Pulse Rate 07/30/16 0957 (!) 104     Resp 07/30/16 0957 20     Temp 07/30/16 0957 98.3 F (36.8 C)     Temp Source 07/30/16 0957  Oral     SpO2 07/30/16 0957 98 %     Weight 07/30/16 0958 175 lb (79.4 kg)     Height 07/30/16 0958 5\' 4"  (1.626 m)     Head Circumference --      Peak Flow --      Pain Score --      Pain Loc --      Pain Edu? --      Excl. in GC? --     Constitutional: Alert and oriented.  in no acute distress. Eyes: Conjunctivae are normal. PERRL. EOMI. Head: Atraumatic. Nose: No congestion/rhinnorhea. Mouth/Throat: Mucous membranes are moist.    Neck: No stridor.   Cardiovascular: Normal rate, regular rhythm. Grossly normal heart sounds.   Respiratory: Normal respiratory effort.  No retractions. Lungs CTAB. Gastrointestinal: Soft and nontender. No distention.  Musculoskeletal: No lower extremity tenderness nor edema.  No joint effusions. Neurologic:  Normal speech and language. No gross focal neurologic deficits are appreciated. Skin:  Skin is warm, dry and intact. No rash noted. Psychiatric: Patient is withdrawn and appears to be holding back information during the examination.  ____________________________________________   LABS (all labs ordered are listed, but only abnormal results are displayed)  Labs Reviewed  COMPREHENSIVE METABOLIC PANEL - Abnormal; Notable for the following:       Result Value   Potassium 2.9 (*)    Glucose, Bld 112 (*)    Total Protein 8.3 (*)    ALT 13 (*)    All other components within normal limits  CBC - Abnormal; Notable for the following:    WBC 12.6 (*)    RBC 5.27 (*)    All other components within normal limits  ETHANOL  URINE DRUG SCREEN, QUALITATIVE ([facility] ONLY)  ACETAMINOPHEN LEVEL  SALICYLATE LEVEL  CBG MONITORING, ED   ____________________________________________  EKG   ____________________________________________  RADIOLOGY   ____________________________________________   PROCEDURES  Procedure(s) performed:   Procedures  Critical Care performed:   ____________________________________________   INITIAL IMPRESSION / ASSESSMENT AND PLAN / ED COURSE  Pertinent labs & imaging results that were available during my care of the patient were reviewed by me and considered in my medical decision making (see chart for details).  I will be up holding the patient's voluntary commitment paperwork. I feel that she will benefit from further evaluation by psychiatry. She is understanding of this plan and willing to comply.  Clinical Course      ____________________________________________   FINAL CLINICAL IMPRESSION(S) / ED DIAGNOSES  Aggressive behavior.    NEW MEDICATIONS STARTED DURING THIS VISIT:  New Prescriptions   No medications on file     Note:  This document was prepared using Dragon voice recognition software and may include unintentional dictation errors.    Myrna Blazer, MD 07/30/16 1048

## 2016-07-30 NOTE — ED Notes (Signed)
Pt requests ibuprofen for a headache.

## 2016-07-30 NOTE — Consult Note (Signed)
[facility] Face-to-Face Psychiatry Consult   Reason for Consult:  35 year old woman came to the hospital with commitment paperwork and reports of worsening confusion and mood symptoms. Referring Physician:  Clearnce Hasten Patient Identification: Shannon Patel MRN:  [id number] Principal Diagnosis: Psychosis Diagnosis:   Patient Active Problem List   Diagnosis Date Noted  . Psychosis [F29] 07/30/2016  . 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 Substance induced mood disorder ([place]) [F19.94] 10/29/2015  . Substance or medication-induced depressive disorder with onset during withdrawal ([place]) [F19.94] 10/28/2015  . Alcohol intoxication ([place]) [F10.129]   . Suicidal ideation [R45.851]     Total Time spent with patient: 1 hour  Subjective:   Shannon Patel is a 35 y.o. female patient admitted with "I don't know. Am I crazy?".  HPI:  Patient interviewed. Chart reviewed. This is a 35 year old woman brought to the hospital under IVC. Mother called 911 this morning. Patient is not a very good historian right now. Appears to have a lot of thought blocking and confusion. She does report that her mood has been feeling bad. She has trouble sleeping at night. She can't concentrate or focus. She denies being aware of having hallucinations. She talks about her family however conspiring against her. She says that all of her family members have been acting strangely and then blaming it on her. She believes that her mother is practicing witchcraft or voodoo to hurt her. Although she denies hallucinations at sounds like she may have had some times of hearing things. She has not been functioning very well although she has been working at [employer]. Patient as I said is rather rambling and confused in her history. She does say she is continuing to take Prozac regularly but no other medicine. Patient claims she has not been drinking or using any abusable drugs. She says that she hasn't had any alcohol except  for one beer approximately a month and a half ago. She denies that she's used any other drugs recently. Patient denies suicidal ideation or homicidal ideation. She does appear tearful and confused.  Social history: Living with her mother. 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 Her children were taken out of her custody with a most recent court hearing she says about a month ago. That has been very traumatic for her. She has 3 children ages 12, 27, and 13. All of them are now in foster care.  Medical history: Denies any ongoing known medical problems.  Substance abuse history: Patient has a well-documented history of substance abuse problems. Has had problems with alcohol abuse and opiate abuse abuse of prescription drugs all in the past. She has been to detox hospitals and [place] houses. She claims that she has not been using except for the above-mentioned one beer in the last few months. She left her most recent [place] a little over a month ago over what she claims were financial problems.  Past Psychiatric History: Patient has a history of at least 3 prior hospitalizations. She says she was at [facility] for 90 days in the spring of this year. Despite the lengthy hospitalization she claims she was only treated with antidepressant medicines Prozac. After she left there she was in and [place]. She has had at least 2 prior psychiatric admissions. She has a past diagnosis of high polar disorder but the more recent notes have amended this to substance-induced mood disorder. It's not clear whether she has ever had a past episode of psychosis  when she wasn't abusing drugs. Only other medicine she can recall taking his Celexa Klonopin and tramadol.  Risk to Self:  denies any suicidal ideation but is not thinking clearly and appears to be psychotic Risk to Others:  denies homicidal ideation but see above. Prior Inpatient Therapy:  at least 3 prior admits. Prior Outpatient Therapy:  has  been followed outpatient but most recently it sounds like it's unclear if she has an outpatient psychiatric provider.  Past Medical History:  Past Medical History:  Diagnosis Date  . Bipolar 1 disorder ([place])    No past surgical history on file. Family History: No family history on file. Family Psychiatric  History: She denies being aware of any family history of mental illness Social History:  History  Alcohol Use  . Yes    Comment: No use in 30 days     History  Drug Use    Comment: Snorted drugs 3 days ago unsure of what it was    Social History   Social History  . Marital status: Married    Spouse name: N/A  . Number of children: N/A  . Years of education: N/A   Social History Main Topics  . Smoking status: Current Every Day Smoker    Packs/day: 0.50    Years: 10.00    Types: Cigarettes  . Smokeless tobacco: Not on file  . Alcohol use Yes     Comment: No use in 30 days  . Drug use:      Comment: Snorted drugs 3 days ago unsure of what it was  . Sexual activity: Not on file   Other Topics Concern  . Not on file   Social History Narrative  . No narrative on file   Additional Social History:    Allergies:  No Known Allergies  Labs:  Results for orders placed or performed during the hospital encounter of 07/30/16 (from the past 48 hour(s))  Comprehensive metabolic panel     Status: Abnormal   Collection Time: 07/30/16 10:03 AM  Result Value Ref Range   Sodium 139 135 - 145 mmol/L   Potassium 2.9 (L) 3.5 - 5.1 mmol/L   Chloride 104 101 - 111 mmol/L   CO2 24 22 - 32 mmol/L   Glucose, Bld 112 (H) 65 - 99 mg/dL   BUN 11 6 - 20 mg/dL   Creatinine, Ser 0.65 0.44 - 1.00 mg/dL   Calcium 9.4 8.9 - 10.3 mg/dL   Total Protein 8.3 (H) 6.5 - 8.1 g/dL   Albumin 4.3 3.5 - 5.0 g/dL   AST 16 15 - 41 U/L   ALT 13 (L) 14 - 54 U/L   Alkaline Phosphatase 73 38 - 126 U/L   Total Bilirubin 0.7 0.3 - 1.2 mg/dL   GFR calc non Af Amer >60 >60 mL/min   GFR calc Af Amer >60  >60 mL/min    Comment: (NOTE) The eGFR has been calculated using the CKD EPI equation. This calculation has not been validated in all clinical situations. eGFR's persistently <60 mL/min signify possible Chronic Kidney Disease.    Anion gap 11 5 - 15  Ethanol     Status: None   Collection Time: 07/30/16 10:03 AM  Result Value Ref Range   Alcohol, Ethyl (B) <5 <5 mg/dL    Comment:        LOWEST DETECTABLE LIMIT FOR SERUM ALCOHOL IS 5 mg/dL FOR MEDICAL PURPOSES ONLY   cbc     Status: Abnormal  Collection Time: 07/30/16 10:03 AM  Result Value Ref Range   WBC 12.6 (H) 3.6 - 11.0 K/uL   RBC 5.27 (H) 3.80 - 5.20 MIL/uL   Hemoglobin 15.0 12.0 - 16.0 g/dL   HCT 44.7 35.0 - 47.0 %   MCV 84.8 80.0 - 100.0 fL   MCH 28.4 26.0 - 34.0 pg   MCHC 33.5 32.0 - 36.0 g/dL   RDW 13.0 11.5 - 14.5 %   Platelets 437 150 - 440 K/uL  Acetaminophen level     Status: Abnormal   Collection Time: 07/30/16 10:03 AM  Result Value Ref Range   Acetaminophen (Tylenol), Serum <10 (L) 10 - 30 ug/mL    Comment:        THERAPEUTIC CONCENTRATIONS VARY SIGNIFICANTLY. A RANGE OF 10-30 ug/mL MAY BE AN EFFECTIVE CONCENTRATION FOR MANY PATIENTS. HOWEVER, SOME ARE BEST TREATED AT CONCENTRATIONS OUTSIDE THIS RANGE. ACETAMINOPHEN CONCENTRATIONS >150 ug/mL AT 4 HOURS AFTER INGESTION AND >50 ug/mL AT 12 HOURS AFTER INGESTION ARE OFTEN ASSOCIATED WITH TOXIC REACTIONS.   Salicylate level     Status: None   Collection Time: 07/30/16 10:03 AM  Result Value Ref Range   Salicylate Lvl <1.6 2.8 - 30.0 mg/dL    Current Facility-Administered Medications  Medication Dose Route Frequency Provider Last Rate Last Dose  . FLUoxetine (PROZAC) capsule 20 mg  20 mg Oral Daily Kadin Canipe T Parnika Tweten, MD      . haloperidol (HALDOL) tablet 1 mg  1 mg Oral Q6H PRN Gonzella Lex, MD      . traZODone (DESYREL) tablet 100 mg  100 mg Oral QHS PRN Gonzella Lex, MD       Current Outpatient Prescriptions  Medication Sig Dispense Refill   . diphenhydrAMINE (BENADRYL) 25 MG tablet Take 25 mg by mouth every evening.    Marland Kitchen FLUoxetine (PROZAC) 20 MG capsule Take 1 capsule (20 mg total) by mouth daily. For depression 30 capsule 0    Musculoskeletal: Strength & Muscle Tone: within normal limits Gait & Station: normal Patient leans: N/A  Psychiatric Specialty Exam: Physical Exam  Nursing note and vitals reviewed. Constitutional: She appears well-developed and well-nourished.  HENT:  Head: Normocephalic and atraumatic.  Eyes: Conjunctivae are normal. Pupils are equal, round, and reactive to light.  Neck: Normal range of motion.  Cardiovascular: Regular rhythm and normal heart sounds.   Respiratory: Effort normal. No respiratory distress.  GI: Soft.  Musculoskeletal: Normal range of motion.  Neurological: She is alert.  Skin: Skin is warm and dry.  Psychiatric: Her mood appears anxious. Her speech is delayed. She is slowed. Thought content is paranoid. She expresses impulsivity. She exhibits a depressed mood. She expresses no homicidal and no suicidal ideation. She exhibits abnormal remote memory.    Review of Systems  Constitutional: Negative.   HENT: Negative.   Eyes: Negative.   Respiratory: Negative.   Cardiovascular: Negative.   Gastrointestinal: Negative.   Musculoskeletal: Negative.   Skin: Negative.   Neurological: Negative.   Psychiatric/Behavioral: Positive for depression and hallucinations. Negative for memory loss, substance abuse and suicidal ideas. The patient is nervous/anxious and has insomnia.     Blood pressure 126/79, pulse (!) 104, temperature 98.3 F (36.8 C), temperature source Oral, resp. rate 20, height 5' 4"  (1.626 m), weight 79.4 kg (175 lb), last menstrual period 07/20/2016, SpO2 98 %.Body mass index is 30.04 kg/m.  General Appearance: Disheveled  Eye Contact:  Minimal  Speech:  Slow  Volume:  Decreased  Mood:  Anxious,  Depressed and Dysphoric  Affect:  Depressed and Tearful  Thought  Process:  Disorganized  Orientation:  Full (Time, Place, and Person)  Thought Content:  Delusions and Paranoid Ideation  Suicidal Thoughts:  No  Homicidal Thoughts:  No  Memory:  Immediate;   Good Recent;   Fair Remote;   Fair  Judgement:  Impaired  Insight:  Shallow  Psychomotor Activity:  Decreased  Concentration:  Concentration: Fair  Recall:  [employer] of Knowledge:  Fair  Language:  Fair  Akathisia:  No  Handed:  Right  AIMS (if indicated):     Assets:  Communication Skills Desire for Improvement Housing Physical Health Resilience  ADL's:  Intact  Cognition:  WNL  Sleep:        Treatment Plan Summary: Daily contact with patient to assess and evaluate symptoms and progress in treatment, Medication management and Plan This is a 35 year old woman with a past history of substance abuse and mental health symptoms. Previous notes have suggested that her symptoms were largely related to her substance abuse but her current drug screen is negative and her alcohol level is negative and the patient is denying any recent drug or alcohol abuse but is presenting with paranoid psychosis. Patient will be admitted to the hospital. Continue Prozac and trazodone at night as needed. Haldol when necessary and down stairs team can elect to provide further medicines as appropriate.  Disposition: Recommend psychiatric Inpatient admission when medically cleared. Supportive therapy provided about ongoing stressors.  Alethia Berthold, MD 07/30/2016 12:38 PM

## 2016-07-30 NOTE — ED Notes (Signed)
Pt refuses to speak with this nurse. States that she has already answered many questions and does not want to repeat herself. This nurse remarked to pt that she seems angry, and asked her if she is angry at this nurse or something else. She indicated that she is angry at "something else" but refuses to discuss. Pt is holding on to lunch tray after eating only a few bites, but refuses to let anyone discard it.

## 2016-07-30 NOTE — Progress Notes (Signed)
This clinician spoke with Nedra HaiLee, Pt Admitting dept to provide information for the patient's transfer to the [facility].       Maryelizabeth Rowanressa Amybeth Sieg, MSW, Clare CharonLCSW, LCAS [facility] Triage Specialist [phone]

## 2016-07-30 NOTE — ED Notes (Signed)
Dr clapacs at bedside 

## 2016-07-30 NOTE — Progress Notes (Signed)
[facility] by [facility]. Toni Amendlapacs.  Attending Physician will be Dr. Jennet MaduroPucilowska.   [facility] ProtectionPhyllis.   Intake Paper Work has been signed and placed on patient chart.  ER staff is aware of the admission [facility]( [place].; Dr. Ivar DrapeSchaevis, ER MD; Corrie DandyMary Patient's Nurse & [facility]). Gerber Penza K. Sherlon HandingHarris, LCAS-A, LPC-A, [facility]  Counselor 07/30/2016 4:36 PM

## 2016-07-30 NOTE — ED Triage Notes (Signed)
[facility] With [facility]. Pt unable to state why she is here. No making eye contact. Denies SI/HI. Reports took 1 prozac.
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
## 2016-07-31 DIAGNOSIS — F4325 Adjustment disorder with mixed disturbance of emotions and conduct: Principal | ICD-10-CM

## 2016-07-31 LAB — TSH: TSH: 1.406 u[IU]/mL (ref 0.350–4.500)

## 2016-07-31 LAB — LIPID PANEL
CHOLESTEROL: 206 mg/dL — AB (ref 0–200)
HDL: 37 mg/dL — ABNORMAL LOW (ref >40)
LDL Cholesterol: 150 mg/dL — ABNORMAL HIGH (ref 0–99)
Total CHOL/HDL Ratio: 5.6 RATIO
Triglycerides: 96 mg/dL (ref <150)
VLDL: 19 mg/dL (ref 0–40)

## 2016-07-31 MED ORDER — PRAZOSIN HCL 2 MG PO CAPS
2.0000 mg | ORAL_CAPSULE | ORAL | Status: DC
  Filled 2016-07-31: qty 1

## 2016-07-31 MED ORDER — FLUVOXAMINE MALEATE 50 MG PO TABS
50.0000 mg | ORAL_TABLET | Freq: Every day | ORAL | Status: DC
  Administered 2016-07-31: 50 mg via ORAL
  Filled 2016-07-31: qty 1

## 2016-07-31 MED ORDER — RISPERIDONE 1 MG PO TABS
2.0000 mg | ORAL_TABLET | Freq: Every day | ORAL | Status: DC
  Administered 2016-07-31 – 2016-08-02 (×3): 2 mg via ORAL
  Filled 2016-07-31 (×3): qty 2

## 2016-07-31 NOTE — [facility] Suicide Risk Assessment (Signed)
[facility] INPATIENT:  Family/Significant Other Suicide Prevention Education  Suicide Prevention Education:  Patient Refusal for Family/Significant Other Suicide Prevention Education: The patient [place] has refused to provide written consent for family/significant other to be provided Family/Significant Other Suicide Prevention Education during admission and/or prior to discharge.  Physician notified. CSW completed SPE with the pt.   [name] 07/31/2016, 4:15 PM

## 2016-07-31 NOTE — Progress Notes (Signed)
Recreation Therapy Notes  Date: 09.22.17 Time: 1:00 pm Location: Craft Room  Group Topic: Self-expression/Coping skills  Goal Area(s) Addresses:  Patient will effectively use art as a means of self-expression. Patient will recognize positive benefit of self-expression. Patient will be able to identify one emotion experienced during group session. Patient will identify use of art as a coping skill.  Behavioral Response: Attentive  Intervention: Two Faces of Me  Activity: Patients were given a blank face worksheet and instructed to draw a line down the middle. On one side of the face, they were instructed to draw or write how they felt when they were admitted to the hospital. On the other side, they were instructed to draw or write how they want to feel when they are d/c from the hospital.  Education: LRT educated group on other forms of self-expression.  Education Outcome: In group clarification offered   Clinical Observations/Feedback: Patient did not participate in group activity. Patient did not contribute to group discussion.   Jacquelynn CreeGreene,Trasean Delima M, LRT/CTRS 07/31/2016 3:13 PM

## 2016-07-31 NOTE — Tx Team (Signed)
Initial Treatment Plan 07/31/2016 12:03 AM Shannon Patel ZOX:[id number]    PATIENT STRESSORS: Substance abuse Traumatic event   PATIENT STRENGTHS: Motivation for treatment/growth Supportive family/friends   PATIENT IDENTIFIED PROBLEMS: Psychosis   Depression   Substance abuse.                  DISCHARGE CRITERIA:  Improved stabilization in mood, thinking, and/or behavior Motivation to continue treatment in a less acute level of care Need for constant or close observation no longer present  PRELIMINARY DISCHARGE PLAN: Outpatient therapy Participate in family therapy  PATIENT/FAMILY INVOLVEMENT: This treatment plan has been presented to and reviewed with the patient, [place] Pat[place], The patient and family have been given the opportunity to ask questions and make suggestions.  Trula Orelubukola Abisola Anuel Sitter, RN 07/31/2016, 12:03 AM
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
## 2016-07-31 NOTE — [facility] Group Notes (Signed)
[facility] Group Notes:  (Nursing/MHT/Case Management/Adjunct)  Date:  07/31/2016  Time:  4:29 PM  Type of Therapy:  Psychoeducational Skills  Participation Level:  Minimal  Participation Quality:  Appropriate and Attentive  Affect:  Flat  Cognitive:  Appropriate  Insight:  Appropriate  Engagement in Group:  Engaged and Supportive  Modes of Intervention:  Discussion and Education  Summary of Progress/Problems:  [place] 07/31/2016, 4:29 PM

## 2016-07-31 NOTE — B[facility] Group Notes (Signed)
[facility] LCSW Group Therapy   07/31/2016 9:30 AM   Type of Therapy: Group Therapy   Participation Level: Pt invited but did not attend.   Participation Quality: Pt invited but did not attend.    Hampton AbbotKadijah Romond Pipkins, MSW, LCSW-A 07/31/2016, 11:57AM

## 2016-07-31 NOTE — Tx Team (Signed)
Deitra MayoRhonda G Antilla  Pt name [id number]    Adjustment disorder with mixed disturbance of emotions and conduct  Principal Problem Principal Problem:   Adjustment disorder with mixed disturbance of emotions and conduct Active Problems:   Substance induced mood disorder (HCC)   Psychosis   Tobacco use disorder  Secondary Dx/Hospital Problem List Current Facility-Administered Medications  Medication Dose Route Frequency Provider Last Rate Last Dose  . acetaminophen (TYLENOL) tablet 650 mg  650 mg Oral Q6H PRN Audery AmelJohn T Clapacs, MD      . alum & mag hydroxide-simeth (MAALOX/MYLANTA) 200-200-20 MG/5ML suspension 30 mL  30 mL Oral Q4H PRN Audery AmelJohn T Clapacs, MD      . FLUoxetine (PROZAC) capsule 20 mg  20 mg Oral Daily Audery AmelJohn T Clapacs, MD   20 mg at 07/31/16 0959  . haloperidol (HALDOL) tablet 1 mg  1 mg Oral Q6H PRN Audery AmelJohn T Clapacs, MD   1 mg at 07/30/16 2228  . magnesium hydroxide (MILK OF MAGNESIA) suspension 30 mL  30 mL Oral Daily PRN Audery AmelJohn T Clapacs, MD      . traZODone (DESYREL) tablet 100 mg  100 mg Oral QHS PRN Audery AmelJohn T Clapacs, MD   100 mg at 07/30/16 2230     Current Meds Prescriptions Prior to Admission  Medication Sig Dispense Refill Last Dose  . diphenhydrAMINE (BENADRYL) 25 MG tablet Take 25 mg by mouth every evening.     Marland Kitchen. FLUoxetine (PROZAC) 20 MG capsule Take 1 capsule (20 mg total) by mouth daily. For depression 30 capsule 0 07/30/2016 at Unknown time     Prior to Admission Meds   Interdisciplinary Treatment and Diagnostic Plan Update  07/31/2016 Time of Session: 11:30 AM  Deitra MayoRhonda G Dipasquale MRN: [id number]  Principal Diagnosis: Adjustment disorder with mixed disturbance of emotions and conduct  Secondary Diagnoses: Principal Problem:   Adjustment disorder with mixed disturbance of emotions and conduct Active Problems:   Substance induced mood disorder (HCC)   Psychosis   Tobacco use disorder   Current Medications:  Current Facility-Administered Medications  Medication Dose Route  Frequency Provider Last Rate Last Dose  . acetaminophen (TYLENOL) tablet 650 mg  650 mg Oral Q6H PRN Audery AmelJohn T Clapacs, MD      . alum & mag hydroxide-simeth (MAALOX/MYLANTA) 200-200-20 MG/5ML suspension 30 mL  30 mL Oral Q4H PRN Audery AmelJohn T Clapacs, MD      . FLUoxetine (PROZAC) capsule 20 mg  20 mg Oral Daily Audery AmelJohn T Clapacs, MD   20 mg at 07/31/16 0959  . haloperidol (HALDOL) tablet 1 mg  1 mg Oral Q6H PRN Audery AmelJohn T Clapacs, MD   1 mg at 07/30/16 2228  . magnesium hydroxide (MILK OF MAGNESIA) suspension 30 mL  30 mL Oral Daily PRN Audery AmelJohn T Clapacs, MD      . traZODone (DESYREL) tablet 100 mg  100 mg Oral QHS PRN Audery AmelJohn T Clapacs, MD   100 mg at 07/30/16 2230    PTA Medications: Prescriptions Prior to Admission  Medication Sig Dispense Refill Last Dose  . diphenhydrAMINE (BENADRYL) 25 MG tablet Take 25 mg by mouth every evening.     Marland Kitchen. FLUoxetine (PROZAC) 20 MG capsule Take 1 capsule (20 mg total) by mouth daily. For depression 30 capsule 0 07/30/2016 at Unknown time    Treatment Modalities: Medication Management, Group therapy, Case management,  1 to 1 session with clinician, Psychoeducation, Recreational therapy.   Physician Treatment Plan for Primary Diagnosis: Adjustment disorder with mixed disturbance of emotions  and conduct Long Term Goal(s): Improvement in symptoms so as ready for discharge  Short Term Goals: Ability to identify changes in lifestyle to reduce recurrence of condition will improve, Ability to verbalize feelings will improve, Ability to disclose and discuss suicidal ideas, Ability to demonstrate self-control will improve, Ability to identify and develop effective coping behaviors will improve and Ability to maintain clinical measurements within normal limits will improve  Medication Management: Evaluate patient's response, side effects, and tolerance of medication regimen.  Therapeutic Interventions: 1 to 1 sessions, Unit Group sessions and Medication administration.  Evaluation of  Outcomes: Progressing  Physician Treatment Plan for Secondary Diagnosis: Principal Problem:   Adjustment disorder with mixed disturbance of emotions and conduct Active Problems:   Substance induced mood disorder (HCC)   Psychosis   Tobacco use disorder   Long Term Goal(s): Improvement in symptoms so as ready for discharge  Short Term Goals: Ability to identify changes in lifestyle to reduce recurrence of condition will improve, Ability to demonstrate self-control will improve and Ability to identify triggers associated with substance abuse/mental health issues will improve  Medication Management: Evaluate patient's response, side effects, and tolerance of medication regimen.  Therapeutic Interventions: 1 to 1 sessions, Unit Group sessions and Medication administration.  Evaluation of Outcomes: Progressing   RN Treatment Plan for Primary Diagnosis: Adjustment disorder with mixed disturbance of emotions and conduct Long Term Goal(s): Knowledge of disease and therapeutic regimen to maintain health will improve  Short Term Goals: Ability to remain free from injury will improve, Ability to participate in decision making will improve, Ability to verbalize feelings will improve, Ability to identify and develop effective coping behaviors will improve and Compliance with prescribed medications will improve  Medication Management: RN will administer medications as ordered by provider, will assess and evaluate patient's response and provide education to patient for prescribed medication. RN will report any adverse and/or side effects to prescribing provider.  Therapeutic Interventions: 1 on 1 counseling sessions, Psychoeducation, Medication administration, Evaluate responses to treatment, Monitor vital signs and CBGs as ordered, Perform/monitor CIWA, COWS, AIMS and Fall Risk screenings as ordered, Perform wound care treatments as ordered.  Evaluation of Outcomes: Progressing   LCSW Treatment  Plan for Primary Diagnosis: Adjustment disorder with mixed disturbance of emotions and conduct Long Term Goal(s): Safe transition to appropriate next level of care at discharge, Engage patient in therapeutic group addressing interpersonal concerns.  Short Term Goals: Engage patient in aftercare planning with referrals and resources, Increase social support, Identify triggers associated with mental health/substance abuse issues and Increase skills for wellness and recovery  Therapeutic Interventions: Assess for all discharge needs, 1 to 1 time with Social worker, Explore available resources and support systems, Assess for adequacy in community support network, Educate family and significant other(s) on suicide prevention, Complete Psychosocial Assessment, Interpersonal group therapy.  Evaluation of Outcomes: Progressing   Progress in Treatment: Attending groups: Yes Participating in groups: Yes Taking medication as prescribed: Yes, MD continues to assess for medication changes as needed Toleration medication: Yes, no side effects reported at this time Family/Significant other contact made:  Patient understands diagnosis:  Discussing patient identified problems/goals with staff: Yes Medical problems stabilized or resolved: Yes Denies suicidal/homicidal ideation:  Issues/concerns per patient self-inventory: None Other: N/A  New problem(s) identified: None identified at this time.   New Short Term/Long Term Goal(s): None identified at this time.   Discharge Plan or Barriers:   Reason for Continuation of Hospitalization: Anxiety Delusions  Depression Hallucinations Homicidal ideation Mania  Medical Issues Medication stabilization Suicidal ideation Withdrawal symptoms  Estimated Length of Stay: 3-5 days  Attendees: Patient: Guyla Bless 07/31/2016  11:30 AM  Physician: Dr. Braulio Conte Pucilowska 07/31/2016  11:30 AM  Nursing: Leonia Reader, RN 07/31/2016  11:30 AM  RN Care Manager:  07/31/2016  11:30 AM  Social Worker: Hampton Abbot, MSW, LCSW-A  Jerald Kief, MSW, LCSW-A 07/31/2016  11:30 AM  Recreational Therapist: Hershal Coria, LRT 07/31/2016  11:30 AM    Scribe for Treatment Team: Lynden Oxford, MSW, LCSW-A

## 2016-07-31 NOTE — Plan of Care (Signed)
Problem: Coping: Goal: Ability to verbalize feelings will improve Outcome: Not Progressing Not forthcoming with information although will answer yes and no questions without any elaboration.

## 2016-07-31 NOTE — Progress Notes (Signed)
  Admission Note:  [age] yr female who presents IVC in acute distress for the treatment of SI, Psychosis and Depression. Pt appears flat and depressed, mood is irritable, patient was noted acting bizarre, staring at staff and peers inappropriately. Patient was noted responding to internal stimuli, thoughts are disorganized, she was cooperative with admission process,  denies SI/HI/AVH and contracts for safety upon admission. Pt stated she experienced worsening depression because her children was recently removed from her custody. Pt has Past medical Hx of depression, suicidal ideation, and Substance abuse. Skin assessment done upon arrival; skin warm to touch and intact, unit policies explained and understanding verbalized. Consents obtained. Food and fluids offered, and fluids accepted. Pt had no additional questions or concerns.

## 2016-07-31 NOTE — B[facility] Counselor (Deleted)
Adult Comprehensive Assessment  Patient ID: Shannon Patel, female   DOB: 1981-10-09, 35 y.o.   MRN: [id number]  Information Source: Information source: Patient  Current Stressors:     Living/Environment/Situation:  Living Arrangements: Parent  Family History:     Childhood History:     Education:     Employment/Work Situation:      Surveyor, quantityinancial Resources:      Alcohol/Substance Abuse:      Social Support System:      Leisure/Recreation:      Strengths/Needs:      Discharge Plan:      Summary/Recommendations:   Summary and Recommendations (to be completed by the evaluator):  Patient is a 35 year old Caucasian female, admitted for treatment of substance induced mood disorder.  Pt has a diagnosis of Psychosis.  Admits to recent use of "unknown white substance: and two Vicodin, past history of opiate use and methadone treatment, states she is not currently using "narcotics of any kind." Pt's primary trigger for admission was a relapse with substances.  Pt's stressors are past traumas, a need for housing and substance abuse issues.  Pt reports her supports in the community are nonexistent.  Patient will benefit from hospitalization to receive psychoeducation and group therapy services to increase coping skills for and understanding of mood disorder and substance use, milieu therapy, medications management, and nursing support.  Patient will develop appropriate coping skills for dealing w overwhelming emotions, stabilize on medications, and develop greater insight into and acceptance of his current illness.  CSWs will develop discharge plan to include family support and referral to appropriate after care services.Patient and CSW reviewed pt's identified goals and treatment plan. Patient verbalized understanding and agreed to treatment plan. CSW reviewed [facility] "Discharge Process and Patient Involvement" Form. Pt verbalized understanding of information provided and signed form.  Patient  declined Quitline.   Shannon Patel. 07/31/2016

## 2016-07-31 NOTE — [facility] Suicide Risk Assessment (Signed)
[facility]   Nursing information obtained from:    Demographic factors:    Current Mental Status:    Loss Factors:    Historical Factors:    Risk Reduction Factors:     Total Time spent with patient: 1 hour Principal Problem: Adjustment disorder with mixed disturbance of emotions and conduct Diagnosis:   Patient Active Problem List   Diagnosis Date Noted  . Psychosis [F29] 07/30/2016  . Recurrent major depression (HCC) [F33.9] 07/30/2016  . 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 Tobacco use disorder [F17.200] 07/30/2016  . Substance induced mood disorder (HCC) [F19.94] 10/29/2015  . 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 Alcohol intoxication (HCC) [F10.129]   . Suicidal ideation [R45.851]    Subjective Data: Substance abuse.  Continued Clinical Symptoms:  Alcohol Use Disorder Identification Test Final Score (AUDIT): 13 The "Alcohol Use Disorders Identification Test", Guidelines for Use in Primary Care, Second Edition.  World Science writerHealth Organization [facility](WHO). Score between 0-7:  no or low risk or alcohol related problems. Score between 8-15:  moderate risk of alcohol related problems. Score between 16-19:  high risk of alcohol related problems. Score 20 or above:  warrants further diagnostic evaluation for alcohol dependence and treatment.   CLINICAL FACTORS:   Depression:   Comorbid alcohol abuse/dependence Impulsivity Alcohol/Substance Abuse/Dependencies Currently Psychotic   Musculoskeletal: Strength & Muscle Tone: within normal limits Gait & Station: normal Patient leans: N/A  Psychiatric Specialty Exam: Physical Exam  Nursing note and vitals reviewed.   Review of Systems  Psychiatric/Behavioral: Positive for substance abuse.    Blood pressure 115/75, pulse 77, temperature 97.9 F (36.6 C), temperature source Oral,  resp. rate 18, height 5\' 3"  (1.6 m), weight 76.2 kg (168 lb), last menstrual period 07/20/2016, SpO2 98 %.Body mass index is 29.76 kg/m.  General Appearance: Casual  Eye Contact:  Good  Speech:  Clear and Coherent  Volume:  Normal  Mood:  Anxious  Affect:  Blunt  Thought Process:  Goal Directed  Orientation:  Full (Time, Place, and Person)  Thought Content:  WDL  Suicidal Thoughts:  No  Homicidal Thoughts:  No  Memory:  Immediate;   Fair Recent;   Fair Remote;   Fair  Judgement:  Impaired  Insight:  Shallow  Psychomotor Activity:  Normal  Concentration:  Concentration: Fair and Attention Span: Fair  Recall:  Fis[employer]Fair  Fund of Knowledge:  Fair  Language:  Fair  Akathisia:  No  Handed:  Right  AIMS (if indicated):     Assets:  Communication Skills Desire for Improvement Housing Physical Health Resilience Social Support  ADL's:  Intact  Cognition:  WNL  Sleep:  Number of Hours: 6.75      COGNITIVE FEATURES THAT CONTRIBUTE TO RISK:  None    SUICIDE RISK:   Moderate:  Frequent suicidal ideation with limited intensity, and duration, some specificity in terms of plans, no associated intent, good self-control, limited dysphoria/symptomatology, some risk factors present, and identifiable protective factors, including available and accessible social support.   PLAN OF CARE: Hospital admission, medication management, substance abuse counseling, discharge planning.  Shannon Patel is a 35 year old female with a history of depression, mood instability, and substance abuse admitted for psychotic episode with confusion.  1. Psychosis. She was started on low-dose Haldol. She reports no psychotic symptoms today. It is quite possible that her symptoms were related to ingestion of substances.  2.  Mood. The patient was treated with Prozac.  3. Substance abuse. The patient has a history of alcohol, opioids and other prescription pill abuse. She denies any current use except for 1 beer prior  to admission. She is not interested in residential substance abuse treatment but would like to go to [facility].  4. Insomnia. Trazodone is available.  5. Disposition. She will be discharged to [facility] house. She will follow up with mental health professionals in the area.  I certify that inpatient services furnished can reasonably be expected to improve the patient's condition.  Kristine Linea, MD 07/31/2016, 8:17 PM

## 2016-07-31 NOTE — Progress Notes (Signed)
First am verbalized that she was tired and sleepy.  Refused to get up for breakfast.   Had to be asked on several different occasions to come to medication room for medication before came.  Initially refused to report to treatment team but then came.   Verbalizes that she does feel a little better although states that she relapsed on substances because she felt like a failure after loosing custody of her children.  Denies SI/HI/AVH. Support and encouragement offered.  Safety maintained.  Maintaining personal care chores.

## 2016-07-31 NOTE — H&P (Addendum)
Psychiatric Admission Assessment Adult  Patient Identification: Shannon Patel MRN:  [id number] Date of Evaluation:  07/31/2016 Chief Complaint:  Bipolar Disorder Principal Diagnosis: Adjustment disorder with mixed disturbance of emotions and conduct Diagnosis:   Patient Active Problem List   Diagnosis Date Noted  . Psychosis [F29] 07/30/2016  . Recurrent major depression (HCC) [F33.9] 07/30/2016  . 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 Tobacco use disorder [F17.200] 07/30/2016  . Substance induced mood disorder (HCC) [F19.94] 10/29/2015  . 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 Alcohol intoxication (HCC) [F10.129]   . Suicidal ideation [R45.851]    History of Present Illness:   Identifying data. Shannon Patel is a 35 year old female with a history of depression, mood instability, psychosis, and substance abuse.  Chief complaint. "I need to change my environment."  History of present illness. Information was obtained from the patient and the chart. The patient has a long history of substance and depression. She has been maintained on Prozac at least since January 2017 when she was hospitalized for substance abuse. 3 months. One month ago she lost custody of her 3 children were placed in foster care. The patient became increasingly depressed but not suicidal. She reports good compliance with treatment. She adamantly denies any recent substance except for a drink of beer prior to admission. Unfortunately urine tox screen were not performed in the emergency room. In spite of my order it was not collected on the unit either. The patient was petitioned by her mother for psychotic and paranoid behavior. In the emergency room the patient was somewhat confused with possibly some paranoia. She was started on as needed dose of Haldol. By the time I saw the patient this morning she was  no longer psychotic. She was tearful when talking about her children She was able to participate in treatment team meeting and discharge planning. She denies any current symptoms of depression or psychosis. She reports hopelessness in the past month and increasing symptoms of anxiety with panic attacks, flashbacks, and OCD type symptoms. She denies symptoms suggestive of bipolar mania. She denies current substance use but recognizes that she has a problem. She is not interested in residential substance abuse treatment. She would like to "change her environment" and is interested in living in [facility].  Past psychiatric history. There were several prior hospitalizations including extended hospitalization for substance abuse. She denies ever attempting suicide. She denies ever taking medicines other than Prozac.  Family psychiatric history. History of substance abuse.  Social history. She lives with her mother. A month ago she went to court and lost custody of her children who are now in foster care.  Total Time spent with patient: 1 hour  Is the patient at risk to self? Yes.    Has the patient been a risk to self in the past 6 months? No.  Has the patient been a risk to self within the distant past? No.  Is the patient a risk to others? No.  Has the patient been a risk to others in the past 6 months? No.  Has the patient been a risk to others within the distant past? No.   Prior Inpatient Therapy:   Prior Outpatient Therapy:    Alcohol Screening: 1. How often do you have a drink containing alcohol?: 2 to 4 times a month 2. How many drinks containing alcohol do you have on a typical day when you are  drinking?: 5 or 6 3. How often do you have six or more drinks on one occasion?: Daily or almost daily Preliminary Score: 6 4. How often during the last year have you found that you were not able to stop drinking once you had started?: Weekly 5. How often during the last year have you failed to  do what was normally expected from you becasue of drinking?: Monthly 6. How often during the last year have you needed a first drink in the morning to get yourself going after a heavy drinking session?: Never 8. How often during the last year have you been unable to remember what happened the night before because you had been drinking?: Never 9. Have you or someone else been injured as a result of your drinking?: No 10. Has a relative or friend or a doctor or another health worker been concerned about your drinking or suggested you cut down?: No Alcohol Use Disorder Identification Test Final Score (AUDIT): 13 Brief Intervention: Patient declined brief intervention Substance Abuse History in the last 12 months:  Yes.   Consequences of Substance Abuse: Negative Previous Psychotropic Medications: Yes  Psychological Evaluations: No  Past Medical History:  Past Medical History:  Diagnosis Date  . Bipolar 1 disorder (HCC)    History reviewed. No pertinent surgical history. Family History: History reviewed. No pertinent family history.  Tobacco Screening: Have you used any form of tobacco in the last 30 days? (Cigarettes, Smokeless Tobacco, Cigars, and/or Pipes): Yes Tobacco use, Select all that apply: 5 or more cigarettes per day Are you interested in Tobacco Cessation Medications?: Yes, will notify MD for an order Counseled patient on smoking cessation including recognizing danger situations, developing coping skills and basic information about quitting provided: Yes Social History:  History  Alcohol Use  . 3.0 oz/week  . 5 Glasses of wine per week    Comment: No use in 30 days     History  Drug Use    Comment: Snorted drugs 3 days ago unsure of what it was    Additional Social History:                           Allergies:  No Known Allergies Lab Results:  Results for orders placed or performed during the hospital encounter of 07/30/16 (from the past 48 hour(s))  Lipid  panel     Status: Abnormal   Collection Time: 07/31/16  7:14 AM  Result Value Ref Range   Cholesterol 206 (H) 0 - 200 mg/dL   Triglycerides 96 <528<150 mg/dL   HDL 37 (L) >41>40 mg/dL   Total CHOL/HDL Ratio 5.6 RATIO   VLDL 19 0 - 40 mg/dL   LDL Cholesterol 324150 (H) 0 - 99 mg/dL    Comment:        Total Cholesterol/HDL:CHD Risk Coronary Heart Disease Risk Table                     Men   Women  1/2 Average Risk   3.4   3.3  Average Risk       5.0   4.4  2 X Average Risk   9.6   7.1  3 X Average Risk  23.4   11.0        Use the calculated Patient Ratio above and the CHD Risk Table to determine the patient's CHD Risk.        ATP III CLASSIFICATION (LDL):  <  100     mg/dL   Optimal  161-096  mg/dL   Near or Above                    Optimal  130-159  mg/dL   Borderline  045-409  mg/dL   High  >811     mg/dL   Very High   TSH     Status: None   Collection Time: 07/31/16  7:14 AM  Result Value Ref Range   TSH 1.406 0.350 - 4.500 uIU/mL    Blood Alcohol level:  Lab Results  Component Value Date   ETH <5 07/30/2016   ETH 249 (H) 10/26/2015    Metabolic Disorder Labs:  No results found for: HGBA1C, MPG No results found for: PROLACTIN Lab Results  Component Value Date   CHOL 206 (H) 07/31/2016   TRIG 96 07/31/2016   HDL 37 (L) 07/31/2016   CHOLHDL 5.6 07/31/2016   VLDL 19 07/31/2016   LDLCALC 150 (H) 07/31/2016    Current Medications: Current Facility-Administered Medications  Medication Dose Route Frequency Provider Last Rate Last Dose  . acetaminophen (TYLENOL) tablet 650 mg  650 mg Oral Q6H PRN Audery Amel, MD      . alum & mag hydroxide-simeth (MAALOX/MYLANTA) 200-200-20 MG/5ML suspension 30 mL  30 mL Oral Q4H PRN Audery Amel, MD      . FLUoxetine (PROZAC) capsule 20 mg  20 mg Oral Daily Audery Amel, MD   20 mg at 07/31/16 0959  . haloperidol (HALDOL) tablet 1 mg  1 mg Oral Q6H PRN Audery Amel, MD   1 mg at 07/30/16 2228  . magnesium hydroxide (MILK OF  MAGNESIA) suspension 30 mL  30 mL Oral Daily PRN Audery Amel, MD      . traZODone (DESYREL) tablet 100 mg  100 mg Oral QHS PRN Audery Amel, MD   100 mg at 07/30/16 2230   PTA Medications: Prescriptions Prior to Admission  Medication Sig Dispense Refill Last Dose  . diphenhydrAMINE (BENADRYL) 25 MG tablet Take 25 mg by mouth every evening.     Marland Kitchen FLUoxetine (PROZAC) 20 MG capsule Take 1 capsule (20 mg total) by mouth daily. For depression 30 capsule 0 07/30/2016 at Unknown time    Musculoskeletal: Strength & Muscle Tone: within normal limits Gait & Station: normal Patient leans: N/A  Psychiatric Specialty Exam: I reviewed physical exam performed in the emergency room and agree with the findings. Physical Exam  Nursing note and vitals reviewed.   Review of Systems  Psychiatric/Behavioral: Positive for substance abuse.  All other systems reviewed and are negative.   Blood pressure 115/75, pulse 77, temperature 97.9 F (36.6 C), temperature source Oral, resp. rate 18, height 5\' 3"  (1.6 m), weight 76.2 kg (168 lb), last menstrual period 07/20/2016, SpO2 98 %.Body mass index is 29.76 kg/m.  See SRA.                                                  Sleep:  Number of Hours: 6.75    Treatment Plan Summary: Daily contact with patient to assess and evaluate symptoms and progress in treatment and Medication management   Shannon Patel is a 35 year old female with a history of depression, mood instability, and substance abuse admitted for psychotic episode with confusion.  1. Psychosis. She was started on low-dose as needed Haldol. She reports no psychotic symptoms today. It is quite possible that her symptoms were related to ingestion of substances. Unfortunately urine tox screen was not performed in the emergency room.  2. Mood and anxiety. The patient was treated with Prozac in the community. We switch to luvox and Risperdal for depression and anxiety. Please  increase Luvox to 100 mg if tolerated. We start Minipress for PTSD.  3. Substance abuse. The patient has a history of alcohol, opioids and other prescription pill abuse. She denies any current use except for 1 beer prior to admission. She is not interested in residential substance abuse treatment but would like to go to [facility].   4. Insomnia. Trazodone is available.  5. Metabolic syndrome monitoring. Cholesterol is slightly elevated, TSH normal.   6. Disposition. She will be discharged to [facility]. She will follow up with mental health professionals in the area.   Observation Level/Precautions:  15 minute checks  Laboratory:  CBC Chemistry Profile UDS UA  Psychotherapy:    Medications:    Consultations:    Discharge Concerns:    Estimated LOS:  Other:     Physician Treatment Plan for Primary Diagnosis: Adjustment disorder with mixed disturbance of emotions and conduct Long Term Goal(s): Improvement in symptoms so as ready for discharge  Short Term Goals: Ability to identify changes in lifestyle to reduce recurrence of condition will improve, Ability to verbalize feelings will improve, Ability to demonstrate self-control will improve, Ability to identify and develop effective coping behaviors will improve, Ability to maintain clinical measurements within normal limits will improve and Compliance with prescribed medications will improve  Physician Treatment Plan for Secondary Diagnosis: Principal Problem:   Adjustment disorder with mixed disturbance of emotions and conduct Active Problems:   Substance induced mood disorder (HCC)   Psychosis   Tobacco use disorder  Long Term Goal(s): Improvement in symptoms so as ready for discharge  Short Term Goals: Ability to identify changes in lifestyle to reduce recurrence of condition will improve, Ability to demonstrate self-control will improve and Ability to identify triggers associated with substance abuse/mental health issues  will improve  I certify that inpatient services furnished can reasonably be expected to improve the patient's condition.    Kristine Linea, MD 9/22/20178:23 PM

## 2016-08-01 LAB — HEMOGLOBIN A1C
Hgb A1c MFr Bld: 5 % (ref 4.8–5.6)
MEAN PLASMA GLUCOSE: 97 mg/dL

## 2016-08-01 MED ORDER — FLUVOXAMINE MALEATE 50 MG PO TABS
50.0000 mg | ORAL_TABLET | Freq: Every morning | ORAL | Status: DC
  Administered 2016-08-02: 50 mg via ORAL
  Filled 2016-08-01: qty 1

## 2016-08-01 MED ORDER — PRAZOSIN HCL 2 MG PO CAPS
2.0000 mg | ORAL_CAPSULE | Freq: Every day | ORAL | Status: DC
  Administered 2016-08-02: 2 mg via ORAL
  Filled 2016-08-01: qty 1

## 2016-08-01 NOTE — [facility] Group Notes (Signed)
[facility] Group Notes:  (Nursing/MHT/Case Management/Adjunct)  Date:  08/01/2016  Time:  12:34 AM  Type of Therapy: Did Not Attend  Participation Level:  Did Not Attend  Participation Quality:  N/A  Affect:  N/A  Cognitive:  N/A  Insight:  None  Engagement in Group:  Did Not Attend  Modes of Intervention:  Activity and Discussion  Summary of Progress/Problems:  Shannon MorrowChelsea Patel Thu Baggett 08/01/2016, 12:34 AM

## 2016-08-01 NOTE — Progress Notes (Signed)
Pt has been pleasant and cooperative Pt denies SI and A/V hallucinations. Pt continues to be seclusive to her room most of the day. Pt's mood and affect has been depressed. Will continue to observe and maintain a safe environment . 

## 2016-08-01 NOTE — [facility] Group Notes (Signed)
[facility] LCSW Group Therapy  08/01/2016 2:34 PM  Type of Therapy:  Group Therapy  Participation Level:  Minimal  Participation Quality:  Drowsy  Affect:  Lethargic  Cognitive:  Appropriate  Insight:  Limited  Engagement in Therapy:  Limited  Modes of Intervention:  Activity, Clarification, Discussion, Education and Support  Summary of Progress/Problems:Communications: Patients identify how individuals communicate with one another appropriately and inappropriately. Patients will be guided to discuss their thoughts, feelings, and behaviors related to barriers when communicating. The group will process together ways to execute positive and appropriate communications. Patient stated she needs to improve her communication but was unable to say what she can improve and who she needs to improve it with. Patient states her medications are making her drowsy and it is difficult to stay awake.    Jeanne Diefendorf G. Garnette CzechSampson MSW, LCSWA 08/01/2016, 2:40 PM

## 2016-08-01 NOTE — Progress Notes (Signed)
[facility] MD Progress Note  08/01/2016 5:33 PM Shannon Patel  MRN:  [id number] Subjective:   The patient reports that she has been having flashbacks related to prior trauma but would not go into any detail about the trauma. She says she feels like she is a failure for losing her children and mood continues to be depressed. She feels somewhat tired this morning after starting the Luvox last night. She denies any worsening depression then at admission but says she does not feel better. She denies any current active or passive suicidal thoughts or psychotic symptoms. She is hoping to be able to go back to the [facility] and have an environment that is supportive of her sobriety. She denies any current psychotic symptoms including auditory or visual hallucinations. She denies any problems with insomnia last night. Appetite is good. She has been attending groups on the unit and interacting well with staff and peers. No behavioral disturbances. Other than feeling tired, she denies any other somatic complaints. BP was low this morning and she was orthostatic.  UDS and Pregnancy test were not completed in the ER and will reorder.  Past psychiatric history. There were several prior hospitalizations including extended hospitalization for substance abuse. She denies ever attempting suicide. She denies ever taking medicines other than Prozac.  Family psychiatric history. History of substance abuse.  Social history. She lives with her mother. A month ago she went to court and lost custody of her children who are now in foster care.   Principal Problem: Adjustment disorder with mixed disturbance of emotions and conduct Diagnosis:   Patient Active Problem List   Diagnosis Date Noted  . Psychosis [F29] 07/30/2016  . Recurrent major depression (HCC) [F33.9] 07/30/2016  . 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 Tobacco use disorder [F17.200] 07/30/2016  .  Substance induced mood disorder (HCC) [F19.94] 10/29/2015  . 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 Alcohol intoxication (HCC) [F10.129]   . Suicidal ideation [R45.851]    Total Time spent with patient: 20 minutes   Past Medical History:  Past Medical History:  Diagnosis Date  . Bipolar 1 disorder (HCC)    History reviewed. No pertinent surgical history. * Social History:  History  Alcohol Use  . 3.0 oz/week  . 5 Glasses of wine per week    Comment: No use in 30 days     History  Drug Use    Comment: Snorted drugs 3 days ago unsure of what it was    Social History   Social History  . Marital status: Married    Spouse name: N/A  . Number of children: N/A  . Years of education: N/A   Social History Main Topics  . Smoking status: Current Every Day Smoker    Packs/day: 0.50    Years: 10.00    Types: Cigarettes  . Smokeless tobacco: Current User  . Alcohol use 3.0 oz/week    5 Glasses of wine per week     Comment: No use in 30 days  . Drug use:      Comment: Snorted drugs 3 days ago unsure of what it was  . Sexual activity: Not Currently    Birth control/ protection: Abstinence   Other Topics Concern  . None   Social History Narrative  . None   Sleep: Good  Appetite:  Good  Current Medications: Current Facility-Administered Medications  Medication Dose Route Frequency  Provider Last Rate Last Dose  . acetaminophen (TYLENOL) tablet 650 mg  650 mg Oral Q6H PRN Audery Amel, MD      . alum & mag hydroxide-simeth (MAALOX/MYLANTA) 200-200-20 MG/5ML suspension 30 mL  30 mL Oral Q4H PRN Audery Amel, MD      . fluvoxaMINE (LUVOX) tablet 50 mg  50 mg Oral QHS Jolanta B Pucilowska, MD   50 mg at 07/31/16 2212  . haloperidol (HALDOL) tablet 1 mg  1 mg Oral Q6H PRN Audery Amel, MD   1 mg at 07/30/16 2228  . magnesium hydroxide (MILK OF MAGNESIA) suspension 30 mL  30 mL Oral Daily PRN Audery Amel, MD      . Melene Muller ON 08/02/2016] prazosin (MINIPRESS) capsule 2 mg  2 mg Oral QHS Darliss Ridgel, MD      . risperiDONE (RISPERDAL) tablet 2 mg  2 mg Oral QHS Shari Prows, MD   2 mg at 07/31/16 2212    Lab Results:  Results for orders placed or performed during the hospital encounter of 07/30/16 (from the past 48 hour(s))  Hemoglobin A1c     Status: None   Collection Time: 07/31/16  7:14 AM  Result Value Ref Range   Hgb A1c MFr Bld 5.0 4.8 - 5.6 %    Comment: (NOTE)         Pre-diabetes: 5.7 - 6.4         Diabetes: >6.4         Glycemic control for adults with diabetes: <7.0    Mean Plasma Glucose 97 mg/dL    Comment: (NOTE) Performed At: [facility] [address] [place], [place] [id number] Mila Homer MD [id number]   Lipid panel     Status: Abnormal   Collection Time: 07/31/16  7:14 AM  Result Value Ref Range   Cholesterol 206 (H) 0 - 200 mg/dL   Triglycerides 96 <952 mg/dL   HDL 37 (L) >84 mg/dL   Total CHOL/HDL Ratio 5.6 RATIO   VLDL 19 0 - 40 mg/dL   LDL Cholesterol 132 (H) 0 - 99 mg/dL    Comment:        Total Cholesterol/HDL:CHD Risk Coronary Heart Disease Risk Table                     Men   Women  1/2 Average Risk   3.4   3.3  Average Risk       5.0   4.4  2 X Average Risk   9.6   7.1  3 X Average Risk  23.4   11.0        Use the calculated Patient Ratio above and the CHD Risk Table to determine the patient's CHD Risk.        ATP III CLASSIFICATION (LDL):  <100     mg/dL   Optimal  440-102  mg/dL   Near or Above                    Optimal  130-159  mg/dL   Borderline  725-366  mg/dL   High  >440     mg/dL   Very High   TSH     Status: None   Collection Time: 07/31/16  7:14 AM  Result Value Ref Range   TSH 1.406 0.350 - 4.500 uIU/mL    Blood Alcohol level:  Lab Results  Component Value Date   ETH <5  07/30/2016   ETH 249 (H) 10/26/2015    Metabolic Disorder Labs: Lab Results  Component Value Date   HGBA1C 5.0  07/31/2016   MPG 97 07/31/2016   No results found for: PROLACTIN Lab Results  Component Value Date   CHOL 206 (H) 07/31/2016   TRIG 96 07/31/2016   HDL 37 (L) 07/31/2016   CHOLHDL 5.6 07/31/2016   VLDL 19 07/31/2016   LDLCALC 150 (H) 07/31/2016    Physical Findings: AIMS: Facial and Oral Movements Muscles of Facial Expression: None, normal Lips and Perioral Area: None, normal Jaw: None, normal Tongue: None, normal,Extremity Movements Upper (arms, wrists, hands, fingers): None, normal Lower (legs, knees, ankles, toes): None, normal, Trunk Movements Neck, shoulders, hips: None, normal, Overall Severity Severity of abnormal movements (highest score from questions above): None, normal Incapacitation due to abnormal movements: None, normal Patient's awareness of abnormal movements (rate only patient's report): No Awareness, Dental Status Current problems with teeth and/or dentures?: No Does patient usually wear dentures?: No  CIWA:    COWS:  COWS Total Score: 0  Musculoskeletal: Strength & Muscle Tone: within normal limits Gait & Station: normal Patient leans: N/A  Psychiatric Specialty Exam: Physical Exam  Review of Systems  Constitutional: Negative.  Negative for chills, fever, malaise/fatigue and weight loss.  HENT: Negative.  Negative for congestion, ear discharge, hearing loss, nosebleeds and sore throat.   Eyes: Negative.  Negative for blurred vision, double vision, photophobia, pain, discharge and redness.  Respiratory: Negative.  Negative for cough, hemoptysis, sputum production, shortness of breath and wheezing.   Cardiovascular: Negative.  Negative for chest pain, palpitations, orthopnea, claudication, leg swelling and PND.  Gastrointestinal: Negative for abdominal pain, blood in stool, constipation, diarrhea, heartburn, nausea and vomiting.  Genitourinary: Negative.  Negative for dysuria, flank pain, frequency, hematuria and urgency.  Musculoskeletal: Negative.   Negative for back pain, falls, joint pain, myalgias and neck pain.  Skin: Negative.  Negative for itching and rash.  Neurological: Negative.  Negative for dizziness, tingling, tremors, sensory change, focal weakness, seizures, loss of consciousness and headaches.  Endo/Heme/Allergies: Negative.  Negative for environmental allergies. Does not bruise/bleed easily.    Blood pressure 113/64, pulse 83, temperature 97.3 F (36.3 C), resp. rate 18, height 5\' 3"  (1.6 m), weight 76.2 kg (168 lb), last menstrual period 07/20/2016, SpO2 98 %.Body mass index is 29.76 kg/m.  General Appearance: Casual  Eye Contact:  Good  Speech:  Clear and Coherent and Normal Rate  Volume:  Normal  Mood:  Depressed  Affect:  Depressed  Thought Process:  Coherent, Goal Directed and Linear  Orientation:  Full (Time, Place, and Person)  Thought Content:  Logical  Suicidal Thoughts:  No  Homicidal Thoughts:  No  Memory:  Immediate;   Good Recent;   Good Remote;   Good  Judgement:  Fair  Insight:  Fair  Psychomotor Activity:  Normal  Concentration:  Concentration: Good and Attention Span: Good  Recall:  Good  Fund of Knowledge:  Good  Language:  Good  Akathisia:  Negative  Handed:  Right  AIMS (if indicated):     Assets:  Communication Skills Desire for Improvement Physical Health  ADL's:  Intact  Cognition:  WNL  Sleep:  Number of Hours: 6.75     Treatment Plan Summary:   [name] is a 35 year old female with a history of depression, mood instability, and substance abuse admitted for psychotic episode with confusion.  1. Psychosis. The patient was started on Risperdal 2 mg  by mouth bedtime for psychosis. . She reports no psychotic symptoms today. It is quite possible that her symptoms were related to ingestion of substances. Unfortunately urine tox screen was not performed in the emergency room.  2. Mood and anxiety. The patient was treated with Prozac in the community. We switch to luvox to 50 mg  by mouth daily for anxiety and depression.  We start Minipress 2 mg by mouth nightly for PTSD.  3. Substance abuse. The patient has a history of alcohol, opioids and other prescription pill abuse. She denies any current use except for 1 beer prior to admission. She is not interested in residential substance abuse treatment but would like to go to [facility].   4. Metabolic syndrome monitoring. Cholesterol is slightly elevated at 206, TSH normal. Hemoglobin A1c was 5.0  6. Disposition. She will be discharged to [facility]. She will follow up for psychotropic medication management at the time of discharge.    Daily contact with patient to assess and evaluate symptoms and progress in treatment and Medication management  Levora Angel, MD 08/01/2016, 5:33 PM

## 2016-08-01 NOTE — Progress Notes (Signed)
Pt denies SI/HI/AVH. Did not attend evening group. Guarded and forwards little. Appears to minimize situation. Expresses desire to go to [facility]. Visible in milieu socializing with peers. Denies pain. Voices no additional concerns. Safety maintained.

## 2016-08-02 MED ORDER — PSEUDOEPHEDRINE HCL ER 120 MG PO TB12
120.0000 mg | ORAL_TABLET | Freq: Two times a day (BID) | ORAL | Status: DC | PRN
  Administered 2016-08-02: 120 mg via ORAL
  Filled 2016-08-02: qty 1

## 2016-08-02 NOTE — [facility] Group Notes (Signed)
[facility] Group Notes:  (Nursing/MHT/Case Management/Adjunct)  Date:  08/02/2016  Time:  4:12 AM  Type of Therapy:  Psychoeducational Skills  Participation Level:  Active  Participation Quality:  Appropriate  Affect:  Appropriate  Cognitive:  Appropriate  Insight:  Appropriate  Engagement in Group:  Engaged  Modes of Intervention:  Discussion, Socialization and Support  Summary of Progress/Problems:  Chancy MilroyLaquanda Y Stonewall Doss 08/02/2016, 4:12 AM

## 2016-08-02 NOTE — Progress Notes (Signed)
D: Patient denies SI/HI/AVH. Patient affect is blunted. Mood is sad.  Patient did attend evening group. Patient visible on the milieu. No distress noted. A: Support and encouragement offered. Scheduled medications given to pt. Q 15 min checks continued for patient safety. R: Patient receptive. Patient remains safe on the unit.   

## 2016-08-02 NOTE — Progress Notes (Signed)
[facility] MD Progress Note  08/02/2016 2:44 PM Shannon Patel  MRN:  [id number]   Subjective:   The patient was more hopeful and positive today. She has been attending groups regularly and says that she wants to focus on her sobriety and being drug-free. She wants to get involved in NA actually wants to get a sponsor. She does report feeling sad and not meant times but does not feel like her depression is worsened overall. She denies any current active or passive suicidal thoughts or psychotic symptoms. She says she feels clear headed more so today than in the past one week. She denies any problems with her appetite and slept over 8 hours last night.  Vital signs are stable. She is complaining of some cough and chest congestion. She does have a runny nose.   UDS and Pregnancy test were not completed in the ER and they were reordered.  Past psychiatric history. There were several prior hospitalizations including extended hospitalization for substance abuse. She denies ever attempting suicide. She denies ever taking medicines other than Prozac.  Family psychiatric history. History of substance abuse.  Social history. She lives with her mother. A month ago she went to court and lost custody of her children who are now in foster care.   Principal Problem: Adjustment disorder with mixed disturbance of emotions and conduct Diagnosis:   Patient Active Problem List   Diagnosis Date Noted  . Psychosis [F29] 07/30/2016  . Recurrent major depression (HCC) [F33.9] 07/30/2016  . 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 Tobacco use disorder [F17.200] 07/30/2016  . Substance induced mood disorder (HCC) [F19.94] 10/29/2015  . 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 Alcohol intoxication (HCC) [F10.129]   . Suicidal ideation [R45.851]    Total Time spent with patient: 20 minutes   Past  Medical History:  Past Medical History:  Diagnosis Date  . Bipolar 1 disorder (HCC)    History reviewed. No pertinent surgical history. * Social History:  History  Alcohol Use  . 3.0 oz/week  . 5 Glasses of wine per week    Comment: No use in 30 days     History  Drug Use    Comment: Snorted drugs 3 days ago unsure of what it was    Social History   Social History  . Marital status: Married    Spouse name: N/A  . Number of children: N/A  . Years of education: N/A   Social History Main Topics  . Smoking status: Current Every Day Smoker    Packs/day: 0.50    Years: 10.00    Types: Cigarettes  . Smokeless tobacco: Current User  . Alcohol use 3.0 oz/week    5 Glasses of wine per week     Comment: No use in 30 days  . Drug use:      Comment: Snorted drugs 3 days ago unsure of what it was  . Sexual activity: Not Currently    Birth control/ protection: Abstinence   Other Topics Concern  . None   Social History Narrative  . None   Sleep: Good  Appetite:  Good  Current Medications: Current Facility-Administered Medications  Medication Dose Route Frequency Provider Last Rate Last Dose  . acetaminophen (TYLENOL) tablet 650 mg  650 mg Oral Q6H PRN Audery Amel, MD   650 mg at 08/01/16 1846  . alum & mag hydroxide-simeth (MAALOX/MYLANTA) 200-200-20 MG/5ML suspension  30 mL  30 mL Oral Q4H PRN Audery Amel, MD      . fluvoxaMINE (LUVOX) tablet 50 mg  50 mg Oral q morning - 10a Darliss Ridgel, MD   50 mg at 08/02/16 0909  . haloperidol (HALDOL) tablet 1 mg  1 mg Oral Q6H PRN Audery Amel, MD   1 mg at 07/30/16 2228  . magnesium hydroxide (MILK OF MAGNESIA) suspension 30 mL  30 mL Oral Daily PRN Audery Amel, MD      . prazosin (MINIPRESS) capsule 2 mg  2 mg Oral QHS Darliss Ridgel, MD      . pseudoephedrine (SUDAFED) 12 hr tablet 120 mg  120 mg Oral Q12H PRN Darliss Ridgel, MD      . risperiDONE (RISPERDAL) tablet 2 mg  2 mg Oral QHS Shari Prows, MD   2 mg at  08/01/16 2133    Lab Results:  No results found for this or any previous visit (from the past 48 hour(s)).  Blood Alcohol level:  Lab Results  Component Value Date   ETH <5 07/30/2016   ETH 249 (H) 10/26/2015    Metabolic Disorder Labs: Lab Results  Component Value Date   HGBA1C 5.0 07/31/2016   MPG 97 07/31/2016   No results found for: PROLACTIN Lab Results  Component Value Date   CHOL 206 (H) 07/31/2016   TRIG 96 07/31/2016   HDL 37 (L) 07/31/2016   CHOLHDL 5.6 07/31/2016   VLDL 19 07/31/2016   LDLCALC 150 (H) 07/31/2016    Physical Findings: AIMS: Facial and Oral Movements Muscles of Facial Expression: None, normal Lips and Perioral Area: None, normal Jaw: None, normal Tongue: None, normal,Extremity Movements Upper (arms, wrists, hands, fingers): None, normal Lower (legs, knees, ankles, toes): None, normal, Trunk Movements Neck, shoulders, hips: None, normal, Overall Severity Severity of abnormal movements (highest score from questions above): None, normal Incapacitation due to abnormal movements: None, normal Patient's awareness of abnormal movements (rate only patient's report): No Awareness, Dental Status Current problems with teeth and/or dentures?: No Does patient usually wear dentures?: No  CIWA:    COWS:  COWS Total Score: 0  Musculoskeletal: Strength & Muscle Tone: within normal limits Gait & Station: normal Patient leans: N/A  Psychiatric Specialty Exam: Physical Exam   Review of Systems  Constitutional: Negative.  Negative for chills, fever, malaise/fatigue and weight loss.  HENT: Negative.  Negative for congestion, ear discharge, hearing loss, nosebleeds and sore throat.   Eyes: Negative.  Negative for blurred vision, double vision, photophobia, pain, discharge and redness.  Respiratory: Positive for cough. Negative for hemoptysis, sputum production, shortness of breath and wheezing.        She is complaining of cough and congestion since  yesterday.  Cardiovascular: Negative.  Negative for chest pain, palpitations, orthopnea, claudication, leg swelling and PND.  Gastrointestinal: Negative for abdominal pain, blood in stool, constipation, diarrhea, heartburn, nausea and vomiting.  Genitourinary: Negative.  Negative for dysuria, flank pain, frequency, hematuria and urgency.  Musculoskeletal: Negative.  Negative for back pain, falls, joint pain, myalgias and neck pain.  Skin: Negative.  Negative for itching and rash.  Neurological: Negative.  Negative for dizziness, tingling, tremors, sensory change, focal weakness, seizures, loss of consciousness and headaches.  Endo/Heme/Allergies: Negative.  Negative for environmental allergies. Does not bruise/bleed easily.    Blood pressure 117/61, pulse 78, temperature 98 F (36.7 C), temperature source Oral, resp. rate 20, height 5\' 3"  (1.6 m), weight 76.2  kg (168 lb), last menstrual period 07/20/2016, SpO2 98 %.Body mass index is 29.76 kg/m.  General Appearance: Casual  Eye Contact:  Good  Speech:  Clear and Coherent and Normal Rate  Volume:  Normal  Mood:  " A little better"  Affect:  Depressed  Thought Process:  Coherent, Goal Directed and Linear  Orientation:  Full (Time, Place, and Person)  Thought Content:  Logical  Suicidal Thoughts:  No  Homicidal Thoughts:  No  Memory:  Immediate;   Good Recent;   Good Remote;   Good  Judgement:  Fair  Insight:  Fair  Psychomotor Activity:  Normal  Concentration:  Concentration: Good and Attention Span: Good  Recall:  Good  Fund of Knowledge:  Good  Language:  Good  Akathisia:  Negative  Handed:  Right  AIMS (if indicated):     Assets:  Communication Skills Desire for Improvement Physical Health  ADL's:  Intact  Cognition:  WNL  Sleep:  Number of Hours: 8.25     Treatment Plan Summary:   [name] is a 35 year old female with a history of depression, mood instability, and substance abuse admitted for psychotic episode with  confusion.  1. Psychosis. The patient was started on Risperdal 2 mg by mouth bedtime for psychosis. . She reports no psychotic symptoms today. It is quite possible that her symptoms were related to ingestion of substances. Unfortunately urine tox screen was not performed in the emergency room. UDS was reordered.  2. Mood and anxiety. The patient was treated with Prozac in the community. Prozac was discontinued and she was started on Luvox 50 mg by mouth daily for anxiety and depression.  She was started on Minipress 2 mg by mouth nightly for PTSD. Trazodone was discontinued due to being on Risperdal and Minipress  3. Substance abuse. The patient has a history of alcohol, opioids and other prescription pill abuse. She denies any current use except for 1 beer prior to admission. She is not interested in residential substance abuse treatment but would [facility].   4. Cough and nasal congestion: Will start Sudafed every 12 hours  5. Metabolic syndrome monitoring. Cholesterol is slightly elevated at 206, TSH normal. Hemoglobin A1c was 5.0  6. Disposition. She will be [facility]. She will follow up for psychotropic medication management at the time of discharge.    Daily contact with patient to assess and evaluate symptoms and progress in treatment and Medication management  Levora AngelKAPUR,Dontrae Morini KAMAL, MD 08/02/2016, 2:44 PM

## 2016-08-02 NOTE — Progress Notes (Signed)
Pt has been pleasant and cooperative.Pt states she feels better today. Pt's mood and affect appears brighter today. Pt denies SI and A/V hallucinations.

## 2016-08-02 NOTE — [facility] Group Notes (Signed)
[facility] LCSW Group Therapy  08/02/2016 2:25 PM  Type of Therapy:  Group Therapy  Participation Level:  Minimal  Participation Quality:  Appropriate  Affect:  Appropriate  Cognitive:  Appropriate  Insight:  Improving  Engagement in Therapy:  Improving  Modes of Intervention:  Activity, Discussion, Education and Support  Summary of Progress/Problems: Self esteem: Patients discussed self esteem and how it impacts them. They discussed what aspects in their lives has influenced their self esteem. They were challenged to identify changes that are needed in order to improve self esteem. Patients participated in activity where they had to identify positive adjectives they felt described their personality. Patients shared with the group on the following areas: Things I am good at, What I like about my appearance, I've helped others by, What I value the most, compliments I have received, challenges I have overcome, thing that make me unique, and Times I've made others happy. Patient stated she is feeling less drowsy than yesterday and that her mood has improved significantly.   Markiah Janeway G. Garnette CzechSampson MSW, LCSWA 08/02/2016, 2:28 PM

## 2016-08-03 MED ORDER — FLUVOXAMINE MALEATE 100 MG PO TABS
100.0000 mg | ORAL_TABLET | Freq: Every morning | ORAL | 1 refills | Status: DC
Start: 2016-08-03 — End: 2016-08-03

## 2016-08-03 MED ORDER — RISPERIDONE 2 MG PO TABS: 2.0000 mg | ORAL_TABLET | Freq: Every day | ORAL | 1 refills | Status: DC

## 2016-08-03 MED ORDER — FLUVOXAMINE MALEATE 100 MG PO TABS: 100.0000 mg | ORAL_TABLET | Freq: Every morning | ORAL | 1 refills | Status: DC

## 2016-08-03 MED ORDER — PRAZOSIN HCL 2 MG PO CAPS: 2.0000 mg | ORAL_CAPSULE | Freq: Every day | ORAL | 1 refills | Status: DC

## 2016-08-03 MED ORDER — FLUVOXAMINE MALEATE 50 MG PO TABS
100.0000 mg | ORAL_TABLET | Freq: Every morning | ORAL | Status: DC
  Administered 2016-08-03: 100 mg via ORAL
  Filled 2016-08-03: qty 2

## 2016-08-03 NOTE — Progress Notes (Signed)
  [facility] :  Will you be returning to the same living situation after discharge:  Yes,  Pt will [facility] to live with her mother until she is admitted into [place] [facility] [place]. At discharge, do you have transportation home?: Yes,  pt will be picked up by her mother Do you have the ability to pay for your medications: Yes,  pt will be provided with prescriptions at discharge  Release of information consent forms completed and in the chart;  Patient's signature needed at discharge.  Patient to Follow up at: Follow-up Information    [facility] .   Specialty:  Behavioral Health Why:  Please arrive to the walk-in clinic Monday through Friday from 9-4pm for your assessment for your hospital follow up for medication management, substance abuse counseling and therapy. Contact information: [address] [place] [address] [phone]           Next level of care provider has access to [facility] Link:yes  Safety Planning and Suicide Prevention discussed: Yes,  completed with pt  Have you used any form of tobacco in the last 30 days? (Cigarettes, Smokeless Tobacco, Cigars, and/or Pipes): Yes  Has patient been referred to the Quitline?: Patient refused referral  Patient has been referred for addiction treatment: Yes  Dorothe PeaJonathan F Dempsey Ahonen 08/03/2016, 3:55 PM

## 2016-08-03 NOTE — [facility] Suicide Risk Assessment (Signed)
[facility]   Principal Problem: Adjustment disorder with mixed disturbance of emotions and conduct Discharge Diagnoses:  Patient Active Problem List   Diagnosis Date Noted  . Psychoses [F29] 07/30/2016  . Recurrent major depression (HCC) [F33.9] 07/30/2016  . 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 Tobacco use disorder [F17.200] 07/30/2016  . Substance induced mood disorder (HCC) [F19.94] 10/29/2015  . 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 Alcohol intoxication (HCC) [F10.129]   . Suicidal ideation [R45.851]     Total Time spent with patient: 30 minutes  Musculoskeletal: Strength & Muscle Tone: within normal limits Gait & Station: normal Patient leans: N/A  Psychiatric Specialty Exam: Review of Systems  Psychiatric/Behavioral: Positive for substance abuse.  All other systems reviewed and are negative.   Blood pressure 110/66, pulse 83, temperature 97.4 F (36.3 C), temperature source Oral, resp. rate 18, height 5\' 3"  (1.6 m), weight 76.2 kg (168 lb), last menstrual period 07/20/2016, SpO2 98 %.Body mass index is 29.76 kg/m.  General Appearance: Casual  Eye Contact::  Good  Speech:  Clear and Coherent409  Volume:  Normal  Mood:  Euthymic  Affect:  Appropriate  Thought Process:  Goal Directed  Orientation:  Full (Time, Place, and Person)  Thought Content:  WDL  Suicidal Thoughts:  No  Homicidal Thoughts:  No  Memory:  Immediate;   Fair Recent;   Fair Remote;   Fair  Judgement:  Impaired  Insight:  Shallow  Psychomotor Activity:  Normal  Concentration:  Fair  Recall:  [employer]Fair  Fund of Knowledge:Fair  Language: Fair  Akathisia:  No  Handed:  Right  AIMS (if indicated):     Assets:  Communication Skills Desire for Improvement Financial Resources/Insurance Housing Physical Health Resilience Social Support  Sleep:   Number of Hours: 8.25  Cognition: WNL  ADL's:  Intact   Mental Status Per Nursing Assessment::   On Admission:     Demographic Factors:  Caucasian, Low socioeconomic status and Unemployed  Loss Factors: Loss of significant relationship  Historical Factors: Prior suicide attempts, Family history of mental illness or substance abuse and Impulsivity  Risk Reduction Factors:   Responsible for children under 35 years of age, Sense of responsibility to family, Living with another person, especially a relative and Positive social support  Continued Clinical Symptoms:  Depression:   Comorbid alcohol abuse/dependence Impulsivity Alcohol/Substance Abuse/Dependencies  Cognitive Features That Contribute To Risk:  None    Suicide Risk:  Minimal: No identifiable suicidal ideation.  Patients presenting with no risk factors but with morbid ruminations; may be classified as minimal risk based on the severity of the depressive symptoms  [facility]   Specialty:  Behavioral Health Why:  Please arrive to the walk-in clinic Monday through Friday from 9-4pm for your assessment for your hospital follow up for medication management and therapy. Contact information: [address] [place] [address] [phone]           Plan Of Care/Follow-up recommendations:  Activity:  as tolerated. Diet:  regular. Other:  keep follow up appointments.  Shannon LineaJolanta Lorie Cleckley, MD 08/03/2016, 11:00 AM

## 2016-08-03 NOTE — Discharge Summary (Signed)
Physician Discharge Summary Note  Patient:  Shannon Patel is an 35 y.o., female MRN:  [id number] DOB:  November 22, 1980 Patient phone:  [phone] (home)  Patient address:   [address] [place] [address],  Total Time spent with patient: 30 minutes  Date of Admission:  07/30/2016 Date of Discharge: 08/03/2016  Reason for Admission:  Suicidal ideation.  Identifying data. Ms. Buege is a 35 year old female with a history of depression, mood instability, psychosis, and substance abuse.  Chief complaint. "I need to change my environment."  History of present illness. Information was obtained from the patient and the chart. The patient has a long history of substance and depression. She has been maintained on Prozac at least since January 2017 when she was hospitalized for substance abuse. 3 months. One month ago she lost custody of her 3 children were placed in foster care. The patient became increasingly depressed but not suicidal. She reports good compliance with treatment. She adamantly denies any recent substance except for a drink of beer prior to admission. Unfortunately urine tox screen were not performed in the emergency room. In spite of my order it was not collected on the unit either. The patient was petitioned by her mother for psychotic and paranoid behavior. In the emergency room the patient was somewhat confused with possibly some paranoia. She was started on as needed dose of Haldol. By the time I saw the patient this morning she was no longer psychotic. She was tearful when talking about her children She was able to participate in treatment team meeting and discharge planning. She denies any current symptoms of depression or psychosis. She reports hopelessness in the past month and increasing symptoms of anxiety with panic attacks, flashbacks, and OCD type symptoms. She denies symptoms suggestive of bipolar mania. She denies current substance use but recognizes that she has a  problem. She is not interested in residential substance abuse treatment. She would like to "change her environment" and is interested in living in [facility].  Past psychiatric history. There were several prior hospitalizations including extended hospitalization for substance abuse. She denies ever attempting suicide. She denies ever taking medicines other than Prozac.  Family psychiatric history. History of substance abuse.  Social history. She lives with her mother. A month ago she went to court and lost custody of her children who are now in foster care.  Principal Problem: Adjustment disorder with mixed disturbance of emotions and conduct Discharge Diagnoses: Patient Active Problem List   Diagnosis Date Noted  . Psychoses [F29] 07/30/2016  . Recurrent major depression (HCC) [F33.9] 07/30/2016  . 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 Tobacco use disorder [F17.200] 07/30/2016  . Substance induced mood disorder (HCC) [F19.94] 10/29/2015  . 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 Alcohol intoxication (HCC) [F10.129]   . Suicidal ideation [R45.851]     Past Medical History:  Past Medical History:  Diagnosis Date  . Bipolar 1 disorder (HCC)    History reviewed. No pertinent surgical history. Family History: History reviewed. No pertinent family history.  Social History:  History  Alcohol Use  . 3.0 oz/week  . 5 Glasses of wine per week    Comment: No use in 30 days     History  Drug Use    Comment: Snorted drugs 3 days ago unsure of what it was    Social History   Social History  . Marital status:  Married    Spouse name: N/A  . Number of children: N/A  . Years of education: N/A   Social History Main Topics  . Smoking status: Current Every Day Smoker    Packs/day: 0.50    Years: 10.00    Types: Cigarettes  . Smokeless tobacco: Current User   . Alcohol use 3.0 oz/week    5 Glasses of wine per week     Comment: No use in 30 days  . Drug use:      Comment: Snorted drugs 3 days ago unsure of what it was  . Sexual activity: Not Currently    Birth control/ protection: Abstinence   Other Topics Concern  . None   Social History Narrative  . None    Hospital Course:    [name] is a 35 year old female with a history of depression, mood instability, and substance abuse admitted for psychotic episode with confusion.  1. Psychosis. The patient was started on Risperdal for psychosis.   2. Mood and anxiety. The patient was treated with Prozac in the community. Prozac was discontinued and she was started on Luvox for anxiety and depression.  She was started on Minipress for PTSD.   3. Substance abuse. The patient has a history of alcohol, opioids and other prescription pill abuse. She denies any current use except for 1 beer prior to admission. She is not interested in residential substance abuse treatment but would like to go to [facility].   4. Cough and nasal congestion: Sudafed was available.   5. Metabolic syndrome monitoring. Cholesterol is slightly elevated at 206, TSH normal. Hemoglobin A1c was 5.0  6. EKG. Normal sinus rhythm. QT 434.  7. Disposition. She will be discharged to [facility]. She will follow up for psychotropic medication management at the time of discharge.  Physical Findings: AIMS: Facial and Oral Movements Muscles of Facial Expression: None, normal Lips and Perioral Area: None, normal Jaw: None, normal Tongue: None, normal,Extremity Movements Upper (arms, wrists, hands, fingers): None, normal Lower (legs, knees, ankles, toes): None, normal, Trunk Movements Neck, shoulders, hips: None, normal, Overall Severity Severity of abnormal movements (highest score from questions above): None, normal Incapacitation due to abnormal movements: None, normal Patient's awareness of abnormal movements  (rate only patient's report): No Awareness, Dental Status Current problems with teeth and/or dentures?: No Does patient usually wear dentures?: No  CIWA:    COWS:  COWS Total Score: 0  Musculoskeletal: Strength & Muscle Tone: within normal limits Gait & Station: normal Patient leans: N/A  Psychiatric Specialty Exam: Physical Exam  Nursing note and vitals reviewed.   Review of Systems  Psychiatric/Behavioral: Positive for substance abuse.  All other systems reviewed and are negative.   Blood pressure 110/66, pulse 83, temperature 97.4 F (36.3 C), temperature source Oral, resp. rate 18, height 5\' 3"  (1.6 m), weight 76.2 kg (168 lb), last menstrual period 07/20/2016, SpO2 98 %.Body mass index is 29.76 kg/m.  See SRA.                                                  Sleep:  Number of Hours: 8.25     Have you used any form of tobacco in the last 30 days? (Cigarettes, Smokeless Tobacco, Cigars, and/or Pipes): Yes  Has this patient used any form of tobacco in the last 30 days? (  Cigarettes, Smokeless Tobacco, Cigars, and/or Pipes) Yes, Yes, A prescription for an FDA-approved tobacco cessation medication was offered at discharge and the patient refused  Blood Alcohol level:  Lab Results  Component Value Date   ETH <5 07/30/2016   ETH 249 (H) 10/26/2015    Metabolic Disorder Labs:  Lab Results  Component Value Date   HGBA1C 5.0 07/31/2016   MPG 97 07/31/2016   No results found for: PROLACTIN Lab Results  Component Value Date   CHOL 206 (H) 07/31/2016   TRIG 96 07/31/2016   HDL 37 (L) 07/31/2016   CHOLHDL 5.6 07/31/2016   VLDL 19 07/31/2016   LDLCALC 150 (H) 07/31/2016    See Psychiatric Specialty Exam and Suicide Risk Assessment completed by Attending Physician prior to discharge.  Discharge destination:  Home  Is patient on multiple antipsychotic therapies at discharge:  No   Has Patient had three or more failed trials of antipsychotic  monotherapy by history:  No  Recommended Plan for Multiple Antipsychotic Therapies: NA  Discharge Instructions    Diet - low sodium heart healthy    Complete by:  As directed    Diet - low sodium heart healthy    Complete by:  As directed    Increase activity slowly    Complete by:  As directed    Increase activity slowly    Complete by:  As directed        Medication List    STOP taking these medications   FLUoxetine 20 MG capsule Commonly known as:  PROZAC     TAKE these medications     Indication  diphenhydrAMINE 25 MG tablet Commonly known as:  BENADRYL Take 25 mg by mouth every evening.  Indication:  Trouble Sleeping   fluvoxaMINE 100 MG tablet Commonly known as:  LUVOX Take 1 tablet (100 mg total) by mouth every morning.  Indication:  Depression, Obsessive Compulsive Disorder   prazosin 2 MG capsule Commonly known as:  MINIPRESS Take 1 capsule (2 mg total) by mouth at bedtime.  Indication:  PTSD   risperiDONE 2 MG tablet Commonly known as:  RISPERDAL Take 1 tablet (2 mg total) by mouth at bedtime.  Indication:  Obsessive Compulsive Disorder      Follow-up Information    [facility] .   Specialty:  Behavioral Health Why:  Please arrive to the walk-in clinic Monday through Friday from 9-4pm for your assessment for your hospital follow up for medication management and therapy. [address] [place] [address] [phone]           Follow-up recommendations:  Activity:  as tolerated. Diet:  regular. Other:  keep follow up appointments.   Comments:    Signed: Kristine Linea, MD 08/03/2016, 11:03 AM

## 2016-08-03 NOTE — Progress Notes (Signed)
D:  Patient affect is blunted. Mood is anxious.  Patient presents with a brighter affect.  Patient did attend evening group. Patient visible on the milieu. No distress noted. A: Support and encouragement offered. Scheduled medications given to pt. Q 15 min checks continued for patient safety. R: Patient receptive. Patient remains safe on the unit.

## 2016-08-03 NOTE — Progress Notes (Signed)
Patient discharged home. DC instructions provided and explained, medications reviewed. Rx given. All questions answered. Pt stable at discharge. Black bag with pink bra returned. 7 day supply medications given.

## 2016-08-03 NOTE — Tx Team (Signed)
Shannon Patel  Pt name [id number]    Adjustment disorder with mixed disturbance of emotions and conduct  Principal Problem Principal Problem:   Adjustment disorder with mixed disturbance of emotions and conduct Active Problems:   Substance induced mood disorder (HCC)   Psychoses   Tobacco use disorder  Secondary Dx/Hospital Problem List Current Facility-Administered Medications  Medication Dose Route Frequency Provider Last Rate Last Dose  . acetaminophen (TYLENOL) tablet 650 mg  650 mg Oral Q6H PRN Audery Amel, MD   650 mg at 08/01/16 1846  . alum & mag hydroxide-simeth (MAALOX/MYLANTA) 200-200-20 MG/5ML suspension 30 mL  30 mL Oral Q4H PRN Audery Amel, MD      . fluvoxaMINE (LUVOX) tablet 100 mg  100 mg Oral q morning - 10a Jolanta B Pucilowska, MD   100 mg at 08/03/16 0939  . haloperidol (HALDOL) tablet 1 mg  1 mg Oral Q6H PRN Audery Amel, MD   1 mg at 07/30/16 2228  . magnesium hydroxide (MILK OF MAGNESIA) suspension 30 mL  30 mL Oral Daily PRN Audery Amel, MD      . prazosin (MINIPRESS) capsule 2 mg  2 mg Oral QHS Darliss Ridgel, MD   2 mg at 08/02/16 2121  . pseudoephedrine (SUDAFED) 12 hr tablet 120 mg  120 mg Oral Q12H PRN Darliss Ridgel, MD   120 mg at 08/02/16 1637  . risperiDONE (RISPERDAL) tablet 2 mg  2 mg Oral QHS Shari Prows, MD   2 mg at 08/02/16 2121   Current Outpatient Prescriptions  Medication Sig Dispense Refill  . diphenhydrAMINE (BENADRYL) 25 MG tablet Take 25 mg by mouth every evening.    . fluvoxaMINE (LUVOX) 100 MG tablet Take 1 tablet (100 mg total) by mouth every morning. 30 tablet 1  . prazosin (MINIPRESS) 2 MG capsule Take 1 capsule (2 mg total) by mouth at bedtime. 30 capsule 1  . risperiDONE (RISPERDAL) 2 MG tablet Take 1 tablet (2 mg total) by mouth at bedtime. 30 tablet 1     Current Meds No prescriptions prior to admission.     Prior to Admission Meds   Interdisciplinary Treatment and Diagnostic Plan Update  08/03/2016 Time of  Session: 3:34 PM  Shannon Patel MRN: [id number]  Principal Diagnosis: Adjustment disorder with mixed disturbance of emotions and conduct  Secondary Diagnoses: Principal Problem:   Adjustment disorder with mixed disturbance of emotions and conduct Active Problems:   Substance induced mood disorder (HCC)   Psychoses   Tobacco use disorder   Current Medications:  Current Facility-Administered Medications  Medication Dose Route Frequency Provider Last Rate Last Dose  . acetaminophen (TYLENOL) tablet 650 mg  650 mg Oral Q6H PRN Audery Amel, MD   650 mg at 08/01/16 1846  . alum & mag hydroxide-simeth (MAALOX/MYLANTA) 200-200-20 MG/5ML suspension 30 mL  30 mL Oral Q4H PRN Audery Amel, MD      . fluvoxaMINE (LUVOX) tablet 100 mg  100 mg Oral q morning - 10a Jolanta B Pucilowska, MD   100 mg at 08/03/16 0939  . haloperidol (HALDOL) tablet 1 mg  1 mg Oral Q6H PRN Audery Amel, MD   1 mg at 07/30/16 2228  . magnesium hydroxide (MILK OF MAGNESIA) suspension 30 mL  30 mL Oral Daily PRN Audery Amel, MD      . prazosin (MINIPRESS) capsule 2 mg  2 mg Oral QHS Darliss Ridgel, MD   2 mg  at 08/02/16 2121  . pseudoephedrine (SUDAFED) 12 hr tablet 120 mg  120 mg Oral Q12H PRN Darliss Ridgel, MD   120 mg at 08/02/16 1637  . risperiDONE (RISPERDAL) tablet 2 mg  2 mg Oral QHS Shari Prows, MD   2 mg at 08/02/16 2121   Current Outpatient Prescriptions  Medication Sig Dispense Refill  . diphenhydrAMINE (BENADRYL) 25 MG tablet Take 25 mg by mouth every evening.    . fluvoxaMINE (LUVOX) 100 MG tablet Take 1 tablet (100 mg total) by mouth every morning. 30 tablet 1  . prazosin (MINIPRESS) 2 MG capsule Take 1 capsule (2 mg total) by mouth at bedtime. 30 capsule 1  . risperiDONE (RISPERDAL) 2 MG tablet Take 1 tablet (2 mg total) by mouth at bedtime. 30 tablet 1    PTA Medications: No prescriptions prior to admission.    Treatment Modalities: Medication Management, Group therapy, Case  management,  1 to 1 session with clinician, Psychoeducation, Recreational therapy.   Physician Treatment Plan for Primary Diagnosis: Adjustment disorder with mixed disturbance of emotions and conduct Long Term Goal(s): Improvement in symptoms so as ready for discharge  Short Term Goals: Ability to identify changes in lifestyle to reduce recurrence of condition will improve, Ability to verbalize feelings will improve, Ability to disclose and discuss suicidal ideas, Ability to demonstrate self-control will improve, Ability to identify and develop effective coping behaviors will improve and Ability to maintain clinical measurements within normal limits will improve  Medication Management: Evaluate patient's response, side effects, and tolerance of medication regimen.  Therapeutic Interventions: 1 to 1 sessions, Unit Group sessions and Medication administration.  Evaluation of Outcomes: Adequate for discharge  Physician Treatment Plan for Secondary Diagnosis: Principal Problem:   Adjustment disorder with mixed disturbance of emotions and conduct Active Problems:   Substance induced mood disorder (HCC)   Psychoses   Tobacco use disorder   Long Term Goal(s): Improvement in symptoms so as ready for discharge  Short Term Goals: Ability to identify changes in lifestyle to reduce recurrence of condition will improve, Ability to demonstrate self-control will improve and Ability to identify triggers associated with substance abuse/mental health issues will improve  Medication Management: Evaluate patient's response, side effects, and tolerance of medication regimen.  Therapeutic Interventions: 1 to 1 sessions, Unit Group sessions and Medication administration.  Evaluation of Outcomes: Adequate for discharge   RN Treatment Plan for Primary Diagnosis: Adjustment disorder with mixed disturbance of emotions and conduct Long Term Goal(s): Knowledge of disease and therapeutic regimen to maintain  health will improve  Short Term Goals: Ability to remain free from injury will improve, Ability to participate in decision making will improve, Ability to verbalize feelings will improve, Ability to identify and develop effective coping behaviors will improve and Compliance with prescribed medications will improve  Medication Management: RN will administer medications as ordered by provider, will assess and evaluate patient's response and provide education to patient for prescribed medication. RN will report any adverse and/or side effects to prescribing provider.  Therapeutic Interventions: 1 on 1 counseling sessions, Psychoeducation, Medication administration, Evaluate responses to treatment, Monitor vital signs and CBGs as ordered, Perform/monitor CIWA, COWS, AIMS and Fall Risk screenings as ordered, Perform wound care treatments as ordered.  Evaluation of Outcomes: Adequate for discharge   LCSW Treatment Plan for Primary Diagnosis: Adjustment disorder with mixed disturbance of emotions and conduct Long Term Goal(s): Safe transition to appropriate next level of care at discharge, Engage patient in therapeutic group addressing interpersonal  concerns.  Short Term Goals: Engage patient in aftercare planning with referrals and resources, Increase social support, Identify triggers associated with mental health/substance abuse issues and Increase skills for wellness and recovery  Therapeutic Interventions: Assess for all discharge needs, 1 to 1 time with Social worker, Explore available resources and support systems, Assess for adequacy in community support network, Educate family and significant other(s) on suicide prevention, Complete Psychosocial Assessment, Interpersonal group therapy.  Evaluation of Outcomes: Adequate for discharge   Progress in Treatment: Attending groups: Yes Participating in groups: Yes Taking medication as prescribed: Yes, MD continues to assess for medication changes  as needed Toleration medication: Yes, no side effects reported at this time Family/Significant other contact made:  Patient understands diagnosis:  Discussing patient identified problems/goals with staff: Yes Medical problems stabilized or resolved: Yes Denies suicidal/homicidal ideation:  Issues/concerns per patient self-inventory: None Other: N/A  New problem(s) identified: None identified at this time.   New Short Term/Long Term Goal(s): None identified at this time.   Discharge Plan or Barriers: [facility] [place] [facility] [place] [facility] after a [place] and [facility], substance abuse treatment and therapy.   Reason for Continuation of Hospitalization: Adequate for discharge  Estimated date of discharge: 08/03/16  Attendees: Patient:  08/03/2016  3:34 PM  Physician: Dr. Kristine LineaJolanta Pucilowska 08/03/2016  3:34 PM  Nursing: Hulan AmatoGwen Farrish, RN 08/03/2016  3:34 PM  RN Care Manager: 08/03/2016  3:34 PM  Social Worker: York GriceJonathan Shondell Fabel, MSW, LCSW-A   08/03/2016  3:34 PM  Recreational Therapist: Hershal CoriaBeth Greene, LRT 08/03/2016  3:34 PM    Scribe for Treatment Team: York GriceJonathan Navraj Dreibelbis, MSW, LCSW-A

## 2016-08-03 NOTE — [facility] Group Notes (Signed)
[facility] LCSW Group Therapy   08/03/2016 9:30 am Type of Therapy: Group Therapy   Participation Level: Active   Participation Quality: Attentive, Sharing and Supportive   Affect: Appropriate  Cognitive: Alert and Oriented   Insight: Developing/Improving and Engaged   Engagement in Therapy: Developing/Improving and Engaged   Modes of Intervention: Clarification, Confrontation, Discussion, Education, Exploration,  Limit-setting, Orientation, Problem-solving, Rapport Building, Dance movement psychotherapisteality Testing, Socialization and Support   Summary of Progress/Problems: Pt identified obstacles faced currently and processed barriers involved in overcoming these obstacles. Pt identified steps necessary for overcoming these obstacles and explored motivation (internal and external) for facing these difficulties head on. Pt further identified one area of concern in their lives and chose a goal to focus on for today. Pt defined obstacle as "something that stands in your way." Pt stated that her obstacle that led to her hospitalization is substance abuse. She stated to overcome that obstacle she has to get substance abuse treatment.   Shannon Patel, MSW, LCSWA 08/03/2016, 11:59AM

## 2016-08-03 NOTE — [facility] Suicide Risk Assessment (Signed)
[facility]   Principal Problem: Adjustment disorder with mixed disturbance of emotions and conduct Discharge Diagnoses:  Patient Active Problem List   Diagnosis Date Noted  . Psychoses [F29] 07/30/2016  . Recurrent major depression (HCC) [F33.9] 07/30/2016  . 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 Tobacco use disorder [F17.200] 07/30/2016  . Substance induced mood disorder (HCC) [F19.94] 10/29/2015  . 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 Alcohol intoxication (HCC) [F10.129]   . Suicidal ideation [R45.851]     Total Time spent with patient: 30 minutes  Musculoskeletal: Strength & Muscle Tone: within normal limits Gait & Station: normal Patient leans: N/A  Psychiatric Specialty Exam: Review of Systems  Psychiatric/Behavioral: Positive for substance abuse.  All other systems reviewed and are negative.   Blood pressure 110/66, pulse 83, temperature 97.4 F (36.3 C), temperature source Oral, resp. rate 18, height 5\' 3"  (1.6 m), weight 76.2 kg (168 lb), last menstrual period 07/20/2016, SpO2 98 %.Body mass index is 29.76 kg/m.  General Appearance: Casual  Eye Contact::  Good  Speech:  Clear and Coherent409  Volume:  Normal  Mood:  Anxious  Affect:  Appropriate  Thought Process:  Goal Directed and Descriptions of Associations: Intact  Orientation:  Full (Time, Place, and Person)  Thought Content:  WDL  Suicidal Thoughts:  No  Homicidal Thoughts:  No  Memory:  Immediate;   Fair Recent;   Fair Remote;   Fair  Judgement:  Impaired  Insight:  Shallow  Psychomotor Activity:  Normal  Concentration:  Fair  Recall:  [employer]Fair  Fund of Knowledge:Fair  Language: Fair  Akathisia:  No  Handed:  Right  AIMS (if indicated):     Assets:  Communication Skills Desire for Improvement Physical Health Resilience Social Support  Sleep:   Number of Hours: 8.25  Cognition: WNL  ADL's:  Intact   Mental Status Per Nursing Assessment::   On Admission:     Demographic Factors:  Low socioeconomic status and Unemployed  Loss Factors: Financial problems/change in socioeconomic status  Historical Factors: Prior suicide attempts, Family history of mental illness or substance abuse and Impulsivity  Risk Reduction Factors:   Sense of responsibility to family and Positive social support  Continued Clinical Symptoms:  Depression:   Comorbid alcohol abuse/dependence Impulsivity Alcohol/Substance Abuse/Dependencies  Cognitive Features That Contribute To Risk:  None    Suicide Risk:  Minimal: No identifiable suicidal ideation.  Patients presenting with no risk factors but with morbid ruminations; may be classified as minimal risk based on the severity of the depressive symptoms  [facility] Information    [facility] .   Specialty:  Behavioral Health Why:  Please arrive to the walk-in clinic Monday through Friday from 9-4pm for your assessment for your hospital follow up for medication management and therapy. Contact information: [address] [place] [address] [phone]           Plan Of Care/Follow-up recommendations:  Activity:  As tolerated. Diet:  Low sodium heart healthy. Other:  Keep follow-up appointments.  Shannon LineaJolanta Kristie Bracewell, MD 08/03/2016, 9:02 AM

## 2016-12-13 ENCOUNTER — Encounter: Payer: Self-pay | Admitting: Emergency Medicine

## 2016-12-13 ENCOUNTER — Emergency Department
Admission: EM | Admit: 2016-12-13 | Discharge: 2016-12-14 | Disposition: A | Discharge status: Other Acute Inpatient Hospital | Payer: No Typology Code available for payment source | Attending: Emergency Medicine | Admitting: Emergency Medicine

## 2016-12-13 DIAGNOSIS — F17210 Nicotine dependence, cigarettes, uncomplicated: Secondary | ICD-10-CM | POA: Diagnosis not present

## 2016-12-13 DIAGNOSIS — F918 Other conduct disorders: Secondary | ICD-10-CM | POA: Insufficient documentation

## 2016-12-13 DIAGNOSIS — Z79899 Other long term (current) drug therapy: Secondary | ICD-10-CM | POA: Diagnosis not present

## 2016-12-13 DIAGNOSIS — R4689 Other symptoms and signs involving appearance and behavior: Secondary | ICD-10-CM

## 2016-12-13 LAB — URINE DRUG SCREEN, QUALITATIVE (ARMC ONLY)
AMPHETAMINES, UR SCREEN: POSITIVE — AB
Barbiturates, Ur Screen: NOT DETECTED
Benzodiazepine, Ur Scrn: NOT DETECTED
COCAINE METABOLITE, UR ~~LOC~~: NOT DETECTED
Cannabinoid 50 Ng, Ur ~~LOC~~: NOT DETECTED
MDMA (ECSTASY) UR SCREEN: NOT DETECTED
Methadone Scn, Ur: NOT DETECTED
Opiate, Ur Screen: NOT DETECTED
PHENCYCLIDINE (PCP) UR S: NOT DETECTED
Tricyclic, Ur Screen: NOT DETECTED

## 2016-12-13 LAB — CBC
HEMATOCRIT: 43.7 % (ref 35.0–47.0)
HEMOGLOBIN: 14.8 g/dL (ref 12.0–16.0)
MCH: 28.6 pg (ref 26.0–34.0)
MCHC: 33.9 g/dL (ref 32.0–36.0)
MCV: 84.5 fL (ref 80.0–100.0)
PLATELETS: 434 10*3/uL (ref 150–440)
RBC: 5.17 MIL/uL (ref 3.80–5.20)
RDW: 13.1 % (ref 11.5–14.5)
WBC: 10.8 10*3/uL (ref 3.6–11.0)

## 2016-12-13 LAB — ETHANOL

## 2016-12-13 LAB — COMPREHENSIVE METABOLIC PANEL
ALBUMIN: 4.8 g/dL (ref 3.5–5.0)
ALT: 20 U/L (ref 14–54)
AST: 32 U/L (ref 15–41)
Alkaline Phosphatase: 75 U/L (ref 38–126)
Anion gap: 11 (ref 5–15)
BUN: 7 mg/dL (ref 6–20)
CHLORIDE: 101 mmol/L (ref 101–111)
CO2: 26 mmol/L (ref 22–32)
CREATININE: 0.87 mg/dL (ref 0.44–1.00)
Calcium: 9.6 mg/dL (ref 8.9–10.3)
GFR calc Af Amer: >60 mL/min (ref >60)
GFR calc non Af Amer: >60 mL/min (ref >60)
Glucose, Bld: 104 mg/dL — ABNORMAL HIGH (ref 65–99)
POTASSIUM: 2.9 mmol/L — AB (ref 3.5–5.1)
SODIUM: 138 mmol/L (ref 135–145)
Total Bilirubin: 0.9 mg/dL (ref 0.3–1.2)
Total Protein: 8.7 g/dL — ABNORMAL HIGH (ref 6.5–8.1)

## 2016-12-13 LAB — SALICYLATE LEVEL: Salicylate Lvl: <7 mg/dL (ref 2.8–30.0)

## 2016-12-13 LAB — ACETAMINOPHEN LEVEL: Acetaminophen (Tylenol), Serum: <10 ug/mL — ABNORMAL LOW (ref 10–30)

## 2016-12-13 MED ORDER — RISPERIDONE 1 MG PO TABS
2.0000 mg | ORAL_TABLET | Freq: Two times a day (BID) | ORAL | Status: DC
Start: 2016-12-13 — End: 2016-12-14
  Administered 2016-12-13 – 2016-12-14 (×2): 2 mg via ORAL
  Filled 2016-12-13 (×2): qty 2

## 2016-12-13 MED ORDER — LORAZEPAM 1 MG PO TABS
1.0000 mg | ORAL_TABLET | ORAL | Status: DC | PRN
  Administered 2016-12-13 – 2016-12-14 (×2): 1 mg via ORAL
  Filled 2016-12-13 (×2): qty 1

## 2016-12-13 NOTE — ED Notes (Signed)
Patient is IVC, SOC is complete, patient is pending admission.

## 2016-12-13 NOTE — ED Notes (Signed)
SOC request has been called per Dr. Malinda's orders. 

## 2016-12-13 NOTE — ED Notes (Signed)

## 2016-12-13 NOTE — ED Notes (Signed)
BEHAVIORAL HEALTH ROUNDING  Patient sleeping: No.  Patient alert and oriented: yes  Behavior appropriate: Yes. ; If no, describe:  Nutrition and fluids offered: Yes  Toileting and hygiene offered: Yes  Sitter present: not applicable, Q 15 min safety rounds and observation.  Law enforcement present: Yes ODS  
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
## 2016-12-14 ENCOUNTER — Encounter: Payer: Self-pay | Admitting: *Deleted

## 2016-12-14 ENCOUNTER — Inpatient Hospital Stay
Admission: EM | Admit: 2016-12-14 | Discharge: 2016-12-17 | DRG: 897 | Disposition: A | Discharge status: Home / Self Care | Payer: No Typology Code available for payment source | Source: Intra-hospital | Attending: Psychiatry | Admitting: Psychiatry

## 2016-12-14 DIAGNOSIS — F29 Unspecified psychosis not due to a substance or known physiological condition: Secondary | ICD-10-CM | POA: Diagnosis not present

## 2016-12-14 DIAGNOSIS — E876 Hypokalemia: Secondary | ICD-10-CM | POA: Diagnosis present

## 2016-12-14 DIAGNOSIS — Z9119 Patient's noncompliance with other medical treatment and regimen: Secondary | ICD-10-CM

## 2016-12-14 DIAGNOSIS — F17210 Nicotine dependence, cigarettes, uncomplicated: Secondary | ICD-10-CM | POA: Diagnosis present

## 2016-12-14 DIAGNOSIS — F319 Bipolar disorder, unspecified: Secondary | ICD-10-CM | POA: Diagnosis present

## 2016-12-14 DIAGNOSIS — F209 Schizophrenia, unspecified: Secondary | ICD-10-CM | POA: Diagnosis present

## 2016-12-14 DIAGNOSIS — F1520 Other stimulant dependence, uncomplicated: Secondary | ICD-10-CM | POA: Diagnosis present

## 2016-12-14 DIAGNOSIS — F918 Other conduct disorders: Secondary | ICD-10-CM | POA: Diagnosis not present

## 2016-12-14 DIAGNOSIS — K219 Gastro-esophageal reflux disease without esophagitis: Secondary | ICD-10-CM | POA: Diagnosis present

## 2016-12-14 DIAGNOSIS — Z62810 Personal history of physical and sexual abuse in childhood: Secondary | ICD-10-CM | POA: Diagnosis present

## 2016-12-14 DIAGNOSIS — F17200 Nicotine dependence, unspecified, uncomplicated: Secondary | ICD-10-CM | POA: Diagnosis present

## 2016-12-14 DIAGNOSIS — J45909 Unspecified asthma, uncomplicated: Secondary | ICD-10-CM | POA: Diagnosis present

## 2016-12-14 DIAGNOSIS — F1590 Other stimulant use, unspecified, uncomplicated: Secondary | ICD-10-CM

## 2016-12-14 DIAGNOSIS — F1021 Alcohol dependence, in remission: Secondary | ICD-10-CM

## 2016-12-14 DIAGNOSIS — G4700 Insomnia, unspecified: Secondary | ICD-10-CM | POA: Diagnosis present

## 2016-12-14 MED ORDER — ALBUTEROL SULFATE HFA 108 (90 BASE) MCG/ACT IN AERS
2.0000 | INHALATION_SPRAY | Freq: Four times a day (QID) | RESPIRATORY_TRACT | Status: DC | PRN
  Filled 2016-12-14: qty 6.7

## 2016-12-14 MED ORDER — ALUM & MAG HYDROXIDE-SIMETH 200-200-20 MG/5ML PO SUSP: 30.0000 mL | ORAL | Status: DC | PRN

## 2016-12-14 MED ORDER — POTASSIUM CHLORIDE CRYS ER 20 MEQ PO TBCR
40.0000 meq | EXTENDED_RELEASE_TABLET | Freq: Once | ORAL | Status: AC
  Administered 2016-12-14: 40 meq via ORAL
  Filled 2016-12-14: qty 2

## 2016-12-14 MED ORDER — RISPERIDONE 1 MG PO TABS
1.0000 mg | ORAL_TABLET | Freq: Two times a day (BID) | ORAL | Status: DC
  Administered 2016-12-14 – 2016-12-17 (×6): 1 mg via ORAL
  Filled 2016-12-14 (×6): qty 1

## 2016-12-14 MED ORDER — TRAZODONE HCL 50 MG PO TABS
50.0000 mg | ORAL_TABLET | Freq: Every evening | ORAL | Status: DC | PRN
  Administered 2016-12-15: 50 mg via ORAL
  Filled 2016-12-14: qty 1

## 2016-12-14 MED ORDER — NICOTINE 21 MG/24HR TD PT24: 21.0000 mg | MEDICATED_PATCH | Freq: Every day | TRANSDERMAL | Status: DC

## 2016-12-14 MED ORDER — ACETAMINOPHEN 325 MG PO TABS
650.0000 mg | ORAL_TABLET | Freq: Four times a day (QID) | ORAL | Status: DC | PRN
  Administered 2016-12-15: 650 mg via ORAL
  Filled 2016-12-14 (×2): qty 2

## 2016-12-14 MED ORDER — POTASSIUM CHLORIDE CRYS ER 20 MEQ PO TBCR
40.0000 meq | EXTENDED_RELEASE_TABLET | Freq: Two times a day (BID) | ORAL | Status: AC
  Administered 2016-12-14 – 2016-12-15 (×3): 40 meq via ORAL
  Filled 2016-12-14 (×3): qty 2

## 2016-12-14 MED ORDER — MAGNESIUM HYDROXIDE 400 MG/5ML PO SUSP: 30.0000 mL | Freq: Every day | ORAL | Status: DC | PRN

## 2016-12-14 MED ORDER — FAMOTIDINE 20 MG PO TABS
20.0000 mg | ORAL_TABLET | Freq: Every day | ORAL | Status: DC
  Administered 2016-12-14 – 2016-12-17 (×4): 20 mg via ORAL
  Filled 2016-12-14 (×4): qty 1

## 2016-12-14 NOTE — ED Notes (Signed)
BEHAVIORAL HEALTH ROUNDING  Patient sleeping: No.  Patient alert and oriented: yes  Behavior appropriate: Yes. ; If no, describe:  Nutrition and fluids offered: Yes  Toileting and hygiene offered: Yes  Sitter present: not applicable, Q 15 min safety rounds and observation.  Law enforcement present: Yes ODS  

## 2016-12-14 NOTE — [facility] Suicide Risk Assessment (Signed)
[facility]   Nursing information obtained from:    Demographic factors:    Current Mental Status:    Loss Factors:    Historical Factors:    Risk Reduction Factors:     Total Time spent with patient: 1 hour Principal Problem: Psychosis Diagnosis:   Patient Active Problem List   Diagnosis Date Noted  . 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 unspecified schizophrenia spectrum  disorder [F29] 12/14/2016  . Tobacco use disorder [F17.200] 07/30/2016   Subjective Data:   Continued Clinical Symptoms:  Alcohol Use Disorder Identification Test Final Score (AUDIT): 0 The "Alcohol Use Disorders Identification Test", Guidelines for Use in Primary Care, Second Edition.  World Science writerHealth Organization [facility](WHO). Score between 0-7:  no or low risk or alcohol related problems. Score between 8-15:  moderate risk of alcohol related problems. Score between 16-19:  high risk of alcohol related problems. Score 20 or above:  warrants further diagnostic evaluation for alcohol dependence and treatment.   CLINICAL FACTORS:   Severe Anxiety and/or Agitation Alcohol/Substance Abuse/Dependencies Currently Psychotic Previous Psychiatric Diagnoses and Treatments   M  Psychiatric Specialty Exam: Physical Exam  ROS  Blood pressure 116/63, pulse (!) 107, temperature 97.6 F (36.4 C), temperature source Oral, resp. rate 18, height 5\' 3"  (1.6 m), weight 71.7 kg (158 lb), SpO2 100 %.Body mass index is 27.99 kg/m.                                                    Sleep:         COGNITIVE FEATURES THAT CONTRIBUTE TO RISK:  Loss of executive function    SUICIDE RISK:   Moderate:  Frequent suicidal ideation with limited intensity, and duration, some specificity in terms of plans, no associated intent, good self-control, limited  dysphoria/symptomatology, some risk factors present, and identifiable protective factors, including available and accessible social support.  [facility]  I certify that inpatient services furnished can reasonably be expected to improve the patient's condition.   Jimmy FootmanHernandez-Gonzalez,  Ronnette Rump, MD 12/14/2016, 4:24 PM

## 2016-12-14 NOTE — [facility] Suicide Risk Assessment (Signed)
[facility] INPATIENT:  Family/Significant Other Suicide Prevention Education  Suicide Prevention Education:  Patient Refusal for Family/Significant Other Suicide Prevention Education: The patient Shannon Patel has refused to provide written consent for family/significant other to be provided Family/Significant Other Suicide Prevention Education during admission and/or prior to discharge.  Physician notified.  Lynden OxfordKadijah R Shimon Trowbridge, MSW, LCSW-A 12/14/2016, 4:00 PM

## 2016-12-14 NOTE — ED Notes (Signed)
Patient received a snack. 

## 2016-12-14 NOTE — ED Notes (Signed)
ED BHU PLACEMENT JUSTIFICATION Is the patient under IVC or is there intent for IVC: Yes.   Is the patient medically cleared: Yes.   Is there vacancy in the ED BHU: Yes.   Is the population mix appropriate for patient: Yes.   Is the patient awaiting placement in inpatient or outpatient setting: Yes.   Has the patient had a psychiatric consult: Yes.   Survey of unit performed for contraband, proper placement and condition of furniture, tampering with fixtures in bathroom, shower, and each patient room: Yes.   APPEARANCE/BEHAVIOR adequate rapport can be established NEURO ASSESSMENT Orientation: place and person Hallucinations: Yes.  Auditory Hallucinations and Visual Hallucinations Speech: Pressured Gait: normal RESPIRATORY ASSESSMENT Normal expansion.  Clear to auscultation.  No rales, rhonchi, or wheezing. CARDIOVASCULAR ASSESSMENT regular rate and rhythm, S1, S2 normal, no murmur, click, rub or gallop GASTROINTESTINAL ASSESSMENT soft, nontender, BS WNL, no r/g EXTREMITIES normal strength, tone, and muscle mass PLAN OF CARE Provide calm/safe environment. Vital signs assessed twice daily. ED BHU Assessment once each 12-hour shift. Collaborate with intake RN daily or as condition indicates. Assure the ED provider has rounded once each shift. Provide and encourage hygiene. Provide redirection as needed. Assess for escalating behavior; address immediately and inform ED provider.  Assess family dynamic and appropriateness for visitation as needed: Yes.   Educate the patient/family about BHU procedures/visitation: Yes.

## 2016-12-14 NOTE — [facility] Counselor (Signed)
Per Deforest HoylesPhyllis [facility] at [facility] accepted to room 325, Dr. Ardyth HarpsHernandez is accepting physician. Pt can be transported after 10am.   Kateri PlummerKristin Alean Kromer LPC, LCASA

## 2016-12-14 NOTE — ED Notes (Signed)
Pt under ivc to be adm to [facility] pt agreeable to plan has no c/o in no distress

## 2016-12-14 NOTE — ED Notes (Signed)
Pt c/o her  Mind is raising and shes feeling anxious , pulse 88 radial her bp was up earlier from early this morning , meds given as ordered , she remains cooperative and pleasant

## 2016-12-14 NOTE — ED Notes (Signed)
Pt is resting now no further c/o

## 2016-12-14 NOTE — H&P (Signed)
Psychiatric Admission Assessment Adult  Patient Identification: Shannon Patel MRN:  [id number] Date of Evaluation:  12/14/2016 Chief Complaint:  Mania and psychosis  Principal Diagnosis: Psychosis Diagnosis:   Patient Active Problem List   Diagnosis Date Noted  . 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 Alcohol use disorder, moderate, in sustained remission ([place]) [F10.21] 12/14/2016  . unspecified schizophrenia spectrum  disorder [F29] 12/14/2016  . Tobacco use disorder [F17.200] 07/30/2016   History of Present Illness:   Patient is a 36 year old single Caucasian female who presented to our emergency department by police on February 4. Per the ER notes the patient displays flight of ideas, pressured speech, delusional thinking, she was seen interacting to internal stimuli.  She was petitioned for evaluation by family for threatening her mother.  Patient has history of multiple psychiatric admissions due to substance-induced psychosis. She has a past history of methamphetamine abuse along with alcohol dependence. In the ER her urine toxicology screen was positive for amphetamines. Alcohol level was below the detection limit.  Patient was interviewed today. She however was a poor historian; she had received medications prior to assessment and therefore she fell asleep multiple times.   Patient reports abusing methamphetamines a couple times a week. She denies the use of any other illicit substances or abusing prescription medications or alcohol. She smokes about half a pack of cigarettes per day.  She denied problems with mood appetite, or energy. She complained that for the last week she is having a lot of trouble with insomnia and has been having great difficulties with concentration. She complains that her concentration issues are severe that she has been using the methamphetamine for it. Patient reports having auditory hallucinations, she reports hearing the voice of  friends that are encouraging her and supporting her.  Trauma history patient claims that she has been diagnosed with PTSD in the past however we were unable to elicit any history of traumatic events.  Per records she has been hospitalized in [facility] behavioral [facility] and also in our unit in the past. She has been diagnosed with substance-induced mood disorder. Appears that she has not been compliant with follow-up of medications. Last hospitalization per our records was in September of last year  Associated Signs/Symptoms: Depression Symptoms:  depressed mood, insomnia, (Hypo) Manic Symptoms:  Distractibility, Impulsivity, Irritable Mood, Anxiety Symptoms:  Excessive Worry, Psychotic Symptoms:  Delusions, Hallucinations: Auditory Paranoia, PTSD Symptoms: NA Total Time spent with patient: 1 hour  Past Psychiatric History: Multiple psychiatric hospitalizations. The records. However the patient provides conflicting information as she says his only being hospitalized one time before. Appears that she has received significant long-term treatment for substance abuse in the past. Currently noncompliant with any medications or any follow up. Denies any history of suicidal attempts in the past.  Is the patient at risk to self? Yes.    Has the patient been a risk to self in the past 6 months? No.  Has the patient been a risk to self within the distant past? No.  Is the patient a risk to others? No.  Has the patient been a risk to others in the past 6 months? No.  Has the patient been a risk to others within the distant past? No.    Alcohol Screening: 1. How often do you have a drink containing alcohol?: Never 9. Have you or someone else been injured as a result of your drinking?: No 10. Has a relative or friend or a doctor or another health  worker been concerned about your drinking or suggested you cut down?: No Alcohol Use Disorder Identification Test Final Score (AUDIT): 0 Brief  Intervention: AUDIT score less than 7 or less-screening does not suggest unhealthy drinking-brief intervention not indicated  Past Medical History: Denies any history of seizures or head trauma Past Medical History:  Diagnosis Date  . Bipolar 1 disorder ([place])    History reviewed. No pertinent surgical history.   Family History: History reviewed. No pertinent family history.   Family Psychiatric  History: Patient reports that her father passed away 8 years ago due to cirrhosis. She reports that her mother had issues with depression. She denies any history of suicides in her family  Tobacco Screening: Have you used any form of tobacco in the last 30 days? (Cigarettes, Smokeless Tobacco, Cigars, and/or Pipes): Yes Tobacco use, Select all that apply: 5 or more cigarettes per day Are you interested in Tobacco Cessation Medications?: Yes, will notify MD for an order Counseled patient on smoking cessation including recognizing danger situations, developing coping skills and basic information about quitting provided: Refused/Declined practical counseling  Social History: Patient is single, never married. She has 3 children ages 34, 73 and 53 year old here all her children are in foster care. Patient claims that she was living alone in a trailer prior to admission. There are other notes that stated she was living with her mother. Patient has one brother and one sister. Her parents divorced when she was young. States that she has been unemployed for at least 6 months in the past she used to work at a [employer]. Reports has legal charges for DWI in the past History  Alcohol Use  . 3.0 oz/week  . 5 Glasses of wine per week    Comment: No use in 30 days     History  Drug Use  . Types: Amphetamines    Comment: Snorted drugs 3 days ago unsure of what it was     Allergies:  No Known Allergies   Lab Results:  Results for orders placed or performed during the hospital encounter of 12/13/16 (from the  past 48 hour(s))  Comprehensive metabolic panel     Status: Abnormal   Collection Time: 12/13/16  7:24 PM  Result Value Ref Range   Sodium 138 135 - 145 mmol/L   Potassium 2.9 (L) 3.5 - 5.1 mmol/L   Chloride 101 101 - 111 mmol/L   CO2 26 22 - 32 mmol/L   Glucose, Bld 104 (H) 65 - 99 mg/dL   BUN 7 6 - 20 mg/dL   Creatinine, Ser 0.87 0.44 - 1.00 mg/dL   Calcium 9.6 8.9 - 10.3 mg/dL   Total Protein 8.7 (H) 6.5 - 8.1 g/dL   Albumin 4.8 3.5 - 5.0 g/dL   AST 32 15 - 41 U/L   ALT 20 14 - 54 U/L   Alkaline Phosphatase 75 38 - 126 U/L   Total Bilirubin 0.9 0.3 - 1.2 mg/dL   GFR calc non Af Amer >60 >60 mL/min   GFR calc Af Amer >60 >60 mL/min    Comment: (NOTE) The eGFR has been calculated using the CKD EPI equation. This calculation has not been validated in all clinical situations. eGFR's persistently <60 mL/min signify possible Chronic Kidney Disease.    Anion gap 11 5 - 15  Ethanol     Status: None   Collection Time: 12/13/16  7:24 PM  Result Value Ref Range   Alcohol, Ethyl (B) <5 <  5 mg/dL    Comment:        LOWEST DETECTABLE LIMIT FOR SERUM ALCOHOL IS 5 mg/dL FOR MEDICAL PURPOSES ONLY   Salicylate level     Status: None   Collection Time: 12/13/16  7:24 PM  Result Value Ref Range   Salicylate Lvl <0.6 2.8 - 30.0 mg/dL  Acetaminophen level     Status: Abnormal   Collection Time: 12/13/16  7:24 PM  Result Value Ref Range   Acetaminophen (Tylenol), Serum <10 (L) 10 - 30 ug/mL    Comment:        THERAPEUTIC CONCENTRATIONS VARY SIGNIFICANTLY. A RANGE OF 10-30 ug/mL MAY BE AN EFFECTIVE CONCENTRATION FOR MANY PATIENTS. HOWEVER, SOME ARE BEST TREATED AT CONCENTRATIONS OUTSIDE THIS RANGE. ACETAMINOPHEN CONCENTRATIONS >150 ug/mL AT 4 HOURS AFTER INGESTION AND >50 ug/mL AT 12 HOURS AFTER INGESTION ARE OFTEN ASSOCIATED WITH TOXIC REACTIONS.   cbc     Status: None   Collection Time: 12/13/16  7:24 PM  Result Value Ref Range   WBC 10.8 3.6 - 11.0 K/uL   RBC 5.17 3.80 -  5.20 MIL/uL   Hemoglobin 14.8 12.0 - 16.0 g/dL   HCT 43.7 35.0 - 47.0 %   MCV 84.5 80.0 - 100.0 fL   MCH 28.6 26.0 - 34.0 pg   MCHC 33.9 32.0 - 36.0 g/dL   RDW 13.1 11.5 - 14.5 %   Platelets 434 150 - 440 K/uL  Urine Drug Screen, Qualitative     Status: Abnormal   Collection Time: 12/13/16  7:24 PM  Result Value Ref Range   Tricyclic, Ur Screen NONE DETECTED NONE DETECTED   Amphetamines, Ur Screen POSITIVE (A) NONE DETECTED   MDMA (Ecstasy)Ur Screen NONE DETECTED NONE DETECTED   Cocaine Metabolite,Ur [place] NONE DETECTED NONE DETECTED   Opiate, Ur Screen NONE DETECTED NONE DETECTED   Phencyclidine (PCP) Ur S NONE DETECTED NONE DETECTED   Cannabinoid 50 Ng, Ur [place] NONE DETECTED NONE DETECTED   Barbiturates, Ur Screen NONE DETECTED NONE DETECTED   Benzodiazepine, Ur Scrn NONE DETECTED NONE DETECTED   Methadone Scn, Ur NONE DETECTED NONE DETECTED    Comment: (NOTE) 301  Tricyclics, urine               Cutoff 1000 ng/mL 200  Amphetamines, urine             Cutoff 1000 ng/mL 300  MDMA (Ecstasy), urine           Cutoff 500 ng/mL 400  Cocaine Metabolite, urine       Cutoff 300 ng/mL 500  Opiate, urine                   Cutoff 300 ng/mL 600  Phencyclidine (PCP), urine      Cutoff 25 ng/mL 700  Cannabinoid, urine              Cutoff 50 ng/mL 800  Barbiturates, urine             Cutoff 200 ng/mL 900  Benzodiazepine, urine           Cutoff 200 ng/mL 1000 Methadone, urine                Cutoff 300 ng/mL 1100 1200 The urine drug screen provides only a preliminary, unconfirmed 1300 analytical test result and should not be used for non-medical 1400 purposes. Clinical consideration and professional judgment should 1500 be applied to any positive drug screen result due to possible 1600 interfering  substances. A more specific alternate chemical method 1700 must be used in order to obtain a confirmed analytical result.  1800 Gas chromato graphy / mass spectrometry (GC/MS) is the preferred 1900  confirmatory method.     Blood Alcohol level:  Lab Results  Component Value Date   ETH <5 12/13/2016   ETH <5 03/40/3524    Metabolic Disorder Labs:  Lab Results  Component Value Date   HGBA1C 5.0 07/31/2016   MPG 97 07/31/2016   No results found for: PROLACTIN Lab Results  Component Value Date   CHOL 206 (H) 07/31/2016   TRIG 96 07/31/2016   HDL 37 (L) 07/31/2016   CHOLHDL 5.6 07/31/2016   VLDL 19 07/31/2016   LDLCALC 150 (H) 07/31/2016    Current Medications: Current Facility-Administered Medications  Medication Dose Route Frequency Provider Last Rate Last Dose  . acetaminophen (TYLENOL) tablet 650 mg  650 mg Oral Q6H PRN Hildred Priest, MD      . albuterol (PROVENTIL HFA;VENTOLIN HFA) 108 (90 Base) MCG/ACT inhaler 2 puff  2 puff Inhalation Q6H PRN Hildred Priest, MD      . alum & mag hydroxide-simeth (MAALOX/MYLANTA) 200-200-20 MG/5ML suspension 30 mL  30 mL Oral Q4H PRN Hildred Priest, MD      . famotidine (PEPCID) tablet 20 mg  20 mg Oral Daily Hildred Priest, MD      . magnesium hydroxide (MILK OF MAGNESIA) suspension 30 mL  30 mL Oral Daily PRN Hildred Priest, MD      . Derrill Memo ON 12/15/2016] nicotine (NICODERM CQ - dosed in mg/24 hours) patch 21 mg  21 mg Transdermal Q0600 Hildred Priest, MD      . potassium chloride SA (K-DUR,KLOR-CON) CR tablet 40 mEq  40 mEq Oral BID Hildred Priest, MD      . risperiDONE (RISPERDAL) tablet 1 mg  1 mg Oral BID Hildred Priest, MD      . traZODone (DESYREL) tablet 50 mg  50 mg Oral QHS PRN Hildred Priest, MD       PTA Medications: Prescriptions Prior to Admission  Medication Sig Dispense Refill Last Dose  . albuterol (PROVENTIL HFA;VENTOLIN HFA) 108 (90 Base) MCG/ACT inhaler Inhale 2 puffs into the lungs every 6 (six) hours as needed.     . famotidine (PEPCID) 20 MG tablet Take 20 mg by mouth daily.     Marland Kitchen FLUoxetine (PROZAC) 40 MG  capsule Take 40 mg by mouth daily.     . nicotine polacrilex (NICORETTE) 2 MG gum Place 2 mg inside cheek as needed.       Musculoskeletal: Strength & Muscle Tone: within normal limits Gait & Station: normal Patient leans: N/A  Psychiatric Specialty Exam: Physical Exam  ROS  Blood pressure 116/63, pulse (!) 107, temperature 97.6 F (36.4 C), temperature source Oral, resp. rate 18, height 5' 3"  (1.6 m), weight 71.7 kg (158 lb), SpO2 100 %.Body mass index is 27.99 kg/m.  General Appearance: Disheveled  Eye Contact:  Minimal  Speech:  Slow  Volume:  Decreased  Mood:  Euthymic  Affect:  Constricted  Thought Process:  Linear and Descriptions of Associations: Intact. A conversation was minimal as the patient was very sedated during assessment   Orientation:  Full (Time, Place, and Person)  Thought Content:  Delusions  Suicidal Thoughts:  No  Homicidal Thoughts:  No  Memory:  Immediate;   Poor Recent;   Poor Remote;   Poor  Judgement:  Impaired  Insight:  Lacking  Psychomotor Activity:  Decreased  Concentration:  Concentration: Poor and Attention Span: Poor  Recall:  Poor  Fund of Knowledge:  Poor  Language:  Poor  Akathisia:  No  Handed:    AIMS (if indicated):     Assets:  Physical Health  ADL's:  Intact  Cognition:  Impaired,  Mild  Sleep:       Treatment Plan Summary:  36 year old Caucasian female admitted with what appears to be substance-induced psychotic episode. She has past history of similar episodes in symptoms. Records she has a long history of addiction to methamphetamines.  Psychosis: We will start the patient on Risperdal 1 mg by mouth twice a day  Insomnia I will place orders for trazodone 50 mg by mouth daily at bedtime when necessary  Methamphetamine use disorder: Patient in need of intensive residential or outpatient substance abuse treatment upon discharge  Tobacco use disorder we will order nicotine patch 21 mg a day  Hypokalemia I will order  potassium 40 mEq twice a day for 3 doses  GERD I will order Pepcid 20 mg daily  Asthma: Tinea albuterol when necessary  Labs I will order hemoglobin A1c, lipid panel, TSH urine pregnancy and a UA  Precautions q 15 minute checks  Diet regular  Vital signs daily  Hospitalization status involuntary commitment  Disposition back with her mother once stable  Follow-up to be determined  Records from prior hospitalizations at being reviewed. Patient also has visits to [facility] crisis unit   Physician Treatment Plan for Primary Diagnosis: Psychosis Long Term Goal(s): Improvement in symptoms so as ready for discharge  Short Term Goals: Ability to identify changes in lifestyle to reduce recurrence of condition will improve, Ability to verbalize feelings will improve, Ability to demonstrate self-control will improve, Ability to identify and develop effective coping behaviors will improve, Compliance with prescribed medications will improve and Ability to identify triggers associated with substance abuse/mental health issues will improve  Physician Treatment Plan for Secondary Diagnosis: Principal Problem:   unspecified schizophrenia spectrum  disorder Active Problems:   Tobacco use disorder   Stimulant use disorder (methanphetamine)   Alcohol use disorder, moderate, in sustained remission ([place])  Long Term Goal(s): Improvement in symptoms so as ready for discharge  Short Term Goals: Ability to identify changes in lifestyle to reduce recurrence of condition will improve, Ability to disclose and discuss suicidal ideas, Ability to identify and develop effective coping behaviors will improve, Ability to maintain clinical measurements within normal limits will improve and Ability to identify triggers associated with substance abuse/mental health issues will improve  I certify that inpatient services furnished can reasonably be expected to improve the patient's condition.    Hildred Priest, MD 2/5/20184:24 PM

## 2016-12-14 NOTE — Progress Notes (Signed)
Patient admitted to unit. Alert and orient x4 but reports feeling sleepy. Pt denies SI, HI, AVH. Reports in here due to "getting control" Pt drowsy during assessment but answers questions appropriately. Skin and contraband search completed and witnessed by Jamesetta SoPhyllis, Charity fundraiserN.  No contraband found. Bruises noted to inner bilateral thighs. Admission assessment completed. Oriented patient to room and unit. Fluid and nutrition offered. Pt remains safe on unit with q 15 min checks.
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
## 2016-12-14 NOTE — ED Notes (Signed)
Report was received from Dorise HissElizabeth C., RN; Pt. With hallucinations and delusions; talking to herself; restless;  denies S.I./Hi. Continue to monitor with 15 min. Monitoring.

## 2016-12-14 NOTE — ED Provider Notes (Signed)
-----------------------------------------   5:30 AM on 12/14/2016 -----------------------------------------   The patient had no acute events since last update.  Calm and cooperative at this time.  Reportedly will need inpatient psych treatment per TTS note, but this has not yet been verified with me.     Loleta Roseory Athens Lebeau, MD 12/14/16 [phone]

## 2016-12-15 LAB — URINALYSIS, COMPLETE (UACMP) WITH MICROSCOPIC
BACTERIA UA: NONE SEEN
BILIRUBIN URINE: NEGATIVE
GLUCOSE, UA: NEGATIVE mg/dL
KETONES UR: 20 mg/dL — AB
LEUKOCYTES UA: NEGATIVE
NITRITE: NEGATIVE
PROTEIN: NEGATIVE mg/dL
Specific Gravity, Urine: 1.004 — ABNORMAL LOW (ref 1.005–1.030)
pH: 6 (ref 5.0–8.0)

## 2016-12-15 LAB — LIPID PANEL
CHOLESTEROL: 258 mg/dL — AB (ref 0–200)
HDL: 35 mg/dL — ABNORMAL LOW (ref >40)
LDL CALC: 206 mg/dL — AB (ref 0–99)
TRIGLYCERIDES: 83 mg/dL (ref <150)
Total CHOL/HDL Ratio: 7.4 RATIO
VLDL: 17 mg/dL (ref 0–40)

## 2016-12-15 LAB — PREGNANCY, URINE: Preg Test, Ur: NEGATIVE

## 2016-12-15 LAB — TSH: TSH: 1.166 u[IU]/mL (ref 0.350–4.500)

## 2016-12-15 MED ORDER — NICOTINE 14 MG/24HR TD PT24: 14.0000 mg | MEDICATED_PATCH | Freq: Every day | TRANSDERMAL | Status: DC

## 2016-12-15 MED ORDER — TRAZODONE HCL 50 MG PO TABS: 50.0000 mg | ORAL_TABLET | Freq: Every day | ORAL | Status: DC

## 2016-12-15 MED ORDER — NICOTINE 14 MG/24HR TD PT24
14.0000 mg | MEDICATED_PATCH | Freq: Every day | TRANSDERMAL | Status: DC
  Administered 2016-12-16 – 2016-12-17 (×2): 14 mg via TRANSDERMAL
  Filled 2016-12-15 (×2): qty 1

## 2016-12-15 MED ORDER — NICOTINE 21 MG/24HR TD PT24: 21.0000 mg | MEDICATED_PATCH | Freq: Every day | TRANSDERMAL | Status: DC

## 2016-12-15 NOTE — Progress Notes (Signed)
Recreation Therapy Notes  INPATIENT RECREATION THERAPY ASSESSMENT  Patient Details Name: Deitra MayoRhonda G Wandler MRN: [id number] DOB: 1981-05-07 Today's Date: 12/15/2016  Patient Stressors: Family (Mother is stressful - "something is not right")  Coping Skills:   Isolate, Exercise, Art/Dance, Talking, Music, Sports, Other (Comment) (Smoking cigarettes)  Personal Challenges: Communication, Concentration, Decision-Making, Expressing Yourself, Relationships, Social Interaction, Stress Management, Trusting Others  Leisure Interests (2+):  Music - Listen, Individual - Other (Comment) (Being out in nature, dancing)  Awareness of Community Resources:  Yes  Community Resources:  Library, [place], [employer]  Current Use: No  If no, Barriers?: Social, Surveyor, quantityinancial  Patient Strengths:  Caring, giving, understanding  Patient Identified Areas of Improvement:  Being more available, interacting and communicating more  Current Recreation Participation:  Nothing  Patient Goal for Hospitalization:  To take what she needs to out of this situation and apply it to her life. Get out on her own  [place]  [place] Residence:  [place]   Current [place] (including self-harm):  No  Current HI:  No  Consent to Intern Participation: N/A   Jacquelynn CreeGreene,Rakeisha Nyce M, LRT/CTRS 12/15/2016, 3:48 PM

## 2016-12-15 NOTE — [facility] Group Notes (Signed)
[facility] Group Notes:  (Nursing/MHT/Case Management/Adjunct)  Date:  12/15/2016  Time:  4:09 PM  Type of Therapy:  Psychoeducational Skills  Participation Level:  Active  Participation Quality:  Appropriate, Attentive and Sharing  Affect:  Appropriate  Cognitive:  Alert and Appropriate  Insight:  Appropriate  Engagement in Group:  Engaged  Modes of Intervention:  Discussion, Education and Support  Summary of Progress/Problems:  Evone Arseneau Travis Belinda Schlichting 12/15/2016, 4:09 PM 

## 2016-12-15 NOTE — Progress Notes (Signed)
Recreation Therapy Notes  Date: 02.06.18 Time: 9:30 am Location: Craft Room  Group Topic: Self-expression  Goal Area(s) Addresses:  Patient will identify one color per emotion listed on wheel. Patient will verbalize benefit of using art as a means of self-expression. Patient will verbalize one emotion experienced during group. Patient will be educated on other forms of self-expression.  Behavioral Response: Attentive, Interactive  Intervention: Emotion Wheel  Activity: Patients were given an Arboriculturistmotion Wheel worksheet and were instructed to pick a color for each emotion listed on the wheel and to color in the section.  Education: LRT educated patients on other forms of self-expression.  Education Outcome: Acknowledges education/In group clarification offered  Clinical Observations/Feedback: Patient picked a color for each emotion listed on the wheel. Patient contributed to group discussion by stating what colors she picked for each emotion and why, how it felt to see her emotions in color, what makes art a good form of self-expression, and what emotions she felt during group.  Jacquelynn CreeGreene,Lenford Beddow M, LRT/CTRS 12/15/2016 10:14 AM

## 2016-12-15 NOTE — Tx Team (Signed)
Initial Treatment Plan 12/15/2016 2:24 PM Shannon MayoRhonda G Patel [id number]    PATIENT STRESSORS: Marital or family conflict Substance abuse   PATIENT STRENGTHS: Average or above average intelligence Capable of independent living General fund of knowledge   PATIENT IDENTIFIED PROBLEMS: Schizophrenia  Substance abuse  pyschosis                 DISCHARGE CRITERIA:  Improved stabilization in mood, thinking, and/or behavior  PRELIMINARY DISCHARGE PLAN: Outpatient therapy  PATIENT/FAMILY INVOLVEMENT: This treatment plan has been presented to and reviewed with the patient, Shannon MayoRhonda G Patel, and/or family member, .  The patient and family have been given the opportunity to ask questions and make suggestions.  Shelia MediaJones, Panzy Bubeck, RN 12/15/2016, 2:24 PM

## 2016-12-15 NOTE — [facility] Group Notes (Signed)
Goals Group Date/Time: 12/15/2016 9:00 AM Type of Therapy and Topic: Group Therapy: Goals Group: SMART Goals   Participation Level: Moderate  Description of Group:    The purpose of a daily goals group is to assist and guide patients in setting recovery/wellness-related goals. The objective is to set goals as they relate to the crisis in which they were admitted. Patients will be using SMART goal modalities to set measurable goals. Characteristics of realistic goals will be discussed and patients will be assisted in setting and processing how one will reach their goal. Facilitator will also assist patients in applying interventions and coping skills learned in psycho-education groups to the SMART goal and process how one will achieve defined goal.   Therapeutic Goals:   -Patients will develop and document one goal related to or their crisis in which brought them into treatment.  -Patients will be guided by LCSW using SMART goal setting modality in how to set a measurable, attainable, realistic and time sensitive goal.  -Patients will process barriers in reaching goal.  -Patients will process interventions in how to overcome and successful in reaching goal.   Patient's Goal: Pt goal is to stay active in the unit, interact with people in the day room, etc, as this helps improve her state of mind.   Therapeutic Modalities:  Motivational Interviewing  Research officer, political partyCognitive Behavioral Therapy  Crisis Intervention Model  SMART goals setting   Daleen SquibbGreg Keaden Gunnoe, [place]CS[place]

## 2016-12-15 NOTE — Progress Notes (Signed)
D: Pt denies SI/HI/AVH. Patient's affect is flat, thoughts are disorganized. Patient is cooperative with treatment plan, no acute distress noted. Pt appears less anxious minimal interaction with peers and staff, A: Pt was offered support and encouragement. Pt was given scheduled medications. Pt was encouraged to attend groups. Q 15 minute checks were done for safety.  R:Pt did not attend evening group. Pt receptive to treatment and safety maintained on unit.

## 2016-12-15 NOTE — Progress Notes (Signed)
Patient with appropriate affect, cooperative behavior with meals, meds and plan of care. No SI/HI at this time. Verbalizes needs appropriately with staff. Nicotene patch dose adjusted. Patient attends therapy groups and states she is "feeling better then when admitted". Safety maintained.
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
## 2016-12-16 LAB — HEMOGLOBIN A1C
Hgb A1c MFr Bld: 4.9 % (ref 4.8–5.6)
MEAN PLASMA GLUCOSE: 94 mg/dL

## 2016-12-16 MED ORDER — TRAZODONE HCL 100 MG PO TABS: 100.0000 mg | ORAL_TABLET | Freq: Every day | ORAL | 0 refills | Status: DC

## 2016-12-16 MED ORDER — TRAZODONE HCL 100 MG PO TABS
100.0000 mg | ORAL_TABLET | Freq: Every day | ORAL | Status: DC
  Administered 2016-12-16: 100 mg via ORAL
  Filled 2016-12-16: qty 1

## 2016-12-16 MED ORDER — RISPERIDONE 1 MG PO TABS: 1.0000 mg | ORAL_TABLET | Freq: Two times a day (BID) | ORAL | 0 refills | Status: DC

## 2016-12-16 NOTE — [facility] Suicide Risk Assessment (Signed)
[facility] INPATIENT:  Family/Significant Other Suicide Prevention Education  Suicide Prevention Education:  Education Completed; mother, Matthias HughsKelly Harden ph#: [phone] has been identified by the patient as the family member/significant other with whom the patient will be residing, and identified as the person(s) who will aid the patient in the event of a mental health crisis (suicidal ideations/suicide attempt).  With written consent from the patient, the family member/significant other has been provided the following suicide prevention education, prior to the and/or following the discharge of the patient.  The suicide prevention education provided includes the following:  Suicide risk factors  Suicide prevention and interventions  National Suicide Hotline telephone number  [facility] assessment telephone number  [facility] Emergency Assistance 911  [facility] and/or Residential Mobile Crisis Unit telephone number  Request made of family/significant other to:  Remove weapons (e.g., guns, rifles, knives), all items previously/currently identified as safety concern.    Remove drugs/medications (over-the-counter, prescriptions, illicit drugs), all items previously/currently identified as a safety concern.  The family member/significant other verbalizes understanding of the suicide prevention education information provided.  The family member/significant other agrees to remove the items of safety concern listed above.  Lynden OxfordKadijah R Rain [place]hide, MSW, LCSW-A 12/16/2016, 1:43 PM

## 2016-12-16 NOTE — Progress Notes (Signed)
Patient with appropriate affect, cooperative behavior with meals, meds and plan of care. Appropriate with peers and staff. No SI/HI at this time. Therapy groups encouraged to learn and initiate coping skills for management of stressors and diagnosis. Patient states she is "feeling better then when she was admitted and is focused on discharging soon". Safety maintained.

## 2016-12-16 NOTE — Plan of Care (Signed)
Problem: [facility] Participation in Recreation Therapeutic Interventions Goal: STG-Other Recreation Therapy Goal (Specify) STG: Decision Making - Within 4 treatment sessions, patient will verbalize understanding of the decision making charts in each of 2 treatment sessions to increase healthy decision making skills.  Outcome: Progressing Treatment Session 1; Completed 1 out of 2: At approximately 3:05 pm, LRT met with patient in craft room. LRT educated and provided patient with decision making charts. Patient verbalized understanding. LRT encouraged patient to use the charts to help her make better decisions.  Leonette Monarch, LRT/CTRS 02.07.18 3:26 pm  Problem: [facility] Participation in Recreation Therapeutic Interventions Goal: STG-Other Recreation Therapy Goal (Specify) STG: Stress Management - Within 4 treatment sessions, patient will verbalize understanding of the stress management techniques in each of 2 treatment sessions to increase stress management skills post d/c.  Outcome: Progressing Treatment Session 1; Completed 1 out of 2: At approximately 3:05 pm, LRT met with patient in craft room. LRT educated and provided patient with handouts on stress management techniques. Patient verbalized understanding. LRT encouraged patient to read over and practice the stress management techniques.  Leonette Monarch, LRT/CTRS 02.07.18 3:28 pm

## 2016-12-16 NOTE — [facility] Group Notes (Signed)
[facility] Group Notes:  (Nursing/MHT/Case Management/Adjunct)  Date:  12/16/2016  Time:  10:19 PM  Type of Therapy:  Evening Wrap-up Group  Participation Level:  Active  Participation Quality:  Appropriate and Attentive  Affect:  Appropriate  Cognitive:  Alert and Appropriate  Insight:  Appropriate  Engagement in Group:  Engaged  Modes of Intervention:  Discussion  Summary of Progress/Problems:  Shannon MorrowChelsea Patel Shannon Patel 12/16/2016, 10:19 PM

## 2016-12-16 NOTE — [facility] Group Notes (Signed)
[facility] LCSW Group Therapy  12/16/2016 2:44 PM  Type of Therapy:  Group Therapy  Participation Level:  Active  Participation Quality:  Attentive  Affect:  Excited  Cognitive:  Lacking  Insight:  Limited  Engagement in Therapy:  Lacking  Modes of Intervention:  Discussion, Education, Problem-solving, Reality Testing and Support  Summary of Progress/Problems: Emotional Regulation: Patients will identify both negative and positive emotions. They will discuss emotions they have difficulty regulating and how they impact their lives. Patients will be asked to identify healthy coping skills to combat unhealthy reactions to negative emotions. Patient shared with the group and was able to engage for the most part although patient would start laughing at random moments throughout the group but was redirectable. CSW provided support to patient and discussed stress management techniques.     Kwabena Strutz G. Garnette CzechSampson MSW, LCSWA 12/16/2016, 2:47 PM

## 2016-12-16 NOTE — Tx Team (Addendum)
Interdisciplinary Treatment and Diagnostic Plan Update  12/16/2016 Time of Session: 10:30 AM Shannon Patel MRN: [id number]  Principal Diagnosis: Psychosis  Secondary Diagnoses: Principal Problem:   unspecified schizophrenia spectrum  disorder Active Problems:   Tobacco use disorder   Stimulant use disorder (methanphetamine)   Alcohol use disorder, moderate, in sustained remission (HCC)   Current Medications:  Current Facility-Administered Medications  Medication Dose Route Frequency Provider Last Rate Last Dose  . acetaminophen (TYLENOL) tablet 650 mg  650 mg Oral Q6H PRN Jimmy Footman, MD   650 mg at 12/15/16 1405  . albuterol (PROVENTIL HFA;VENTOLIN HFA) 108 (90 Base) MCG/ACT inhaler 2 puff  2 puff Inhalation Q6H PRN Jimmy Footman, MD      . alum & mag hydroxide-simeth (MAALOX/MYLANTA) 200-200-20 MG/5ML suspension 30 mL  30 mL Oral Q4H PRN Jimmy Footman, MD      . famotidine (PEPCID) tablet 20 mg  20 mg Oral Daily Jimmy Footman, MD   20 mg at 12/16/16 0735  . magnesium hydroxide (MILK OF MAGNESIA) suspension 30 mL  30 mL Oral Daily PRN Jimmy Footman, MD      . nicotine (NICODERM CQ - dosed in mg/24 hours) patch 14 mg  14 mg Transdermal Q0600 Jimmy Footman, MD   14 mg at 12/16/16 0735  . risperiDONE (RISPERDAL) tablet 1 mg  1 mg Oral BID Jimmy Footman, MD   1 mg at 12/16/16 0735  . traZODone (DESYREL) tablet 50 mg  50 mg Oral QHS PRN Jimmy Footman, MD   50 mg at 12/15/16 2116  . traZODone (DESYREL) tablet 50 mg  50 mg Oral QHS Jimmy Footman, MD       PTA Medications: Prescriptions Prior to Admission  Medication Sig Dispense Refill Last Dose  . albuterol (PROVENTIL HFA;VENTOLIN HFA) 108 (90 Base) MCG/ACT inhaler Inhale 2 puffs into the lungs every 6 (six) hours as needed.     . famotidine (PEPCID) 20 MG tablet Take 20 mg by mouth daily.     Marland Kitchen FLUoxetine (PROZAC) 40 MG capsule  Take 40 mg by mouth daily.     . nicotine polacrilex (NICORETTE) 2 MG gum Place 2 mg inside cheek as needed.       Patient Stressors: Marital or family conflict Substance abuse  Patient Strengths: Average or above average intelligence Capable of independent living General fund of knowledge  Treatment Modalities: Medication Management, Group therapy, Case management,  1 to 1 session with clinician, Psychoeducation, Recreational therapy.   Physician Treatment Plan for Primary Diagnosis: Psychosis Long Term Goal(s): Improvement in symptoms so as ready for discharge Improvement in symptoms so as ready for discharge   Short Term Goals: Ability to identify changes in lifestyle to reduce recurrence of condition will improve Ability to verbalize feelings will improve Ability to demonstrate self-control will improve Ability to identify and develop effective coping behaviors will improve Compliance with prescribed medications will improve Ability to identify triggers associated with substance abuse/mental health issues will improve Ability to identify changes in lifestyle to reduce recurrence of condition will improve Ability to disclose and discuss suicidal ideas Ability to identify and develop effective coping behaviors will improve Ability to maintain clinical measurements within normal limits will improve Ability to identify triggers associated with substance abuse/mental health issues will improve  Medication Management: Evaluate patient's response, side effects, and tolerance of medication regimen.  Therapeutic Interventions: 1 to 1 sessions, Unit Group sessions and Medication administration.  Evaluation of Outcomes: Progressing  Physician Treatment Plan for Secondary Diagnosis:  Principal Problem:   unspecified schizophrenia spectrum  disorder Active Problems:   Tobacco use disorder   Stimulant use disorder (methanphetamine)   Alcohol use disorder, moderate, in sustained  remission (HCC)  Long Term Goal(s): Improvement in symptoms so as ready for discharge Improvement in symptoms so as ready for discharge   Short Term Goals: Ability to identify changes in lifestyle to reduce recurrence of condition will improve Ability to verbalize feelings will improve Ability to demonstrate self-control will improve Ability to identify and develop effective coping behaviors will improve Compliance with prescribed medications will improve Ability to identify triggers associated with substance abuse/mental health issues will improve Ability to identify changes in lifestyle to reduce recurrence of condition will improve Ability to disclose and discuss suicidal ideas Ability to identify and develop effective coping behaviors will improve Ability to maintain clinical measurements within normal limits will improve Ability to identify triggers associated with substance abuse/mental health issues will improve     Medication Management: Evaluate patient's response, side effects, and tolerance of medication regimen.  Therapeutic Interventions: 1 to 1 sessions, Unit Group sessions and Medication administration.  Evaluation of Outcomes: Progressing   RN Treatment Plan for Primary Diagnosis: Psychosis Long Term Goal(s): Knowledge of disease and therapeutic regimen to maintain health will improve  Short Term Goals: Ability to demonstrate self-control, Ability to verbalize feelings will improve, Ability to disclose and discuss suicidal ideas and Ability to identify and develop effective coping behaviors will improve  Medication Management: RN will administer medications as ordered by provider, will assess and evaluate patient's response and provide education to patient for prescribed medication. RN will report any adverse and/or side effects to prescribing provider.  Therapeutic Interventions: 1 on 1 counseling sessions, Psychoeducation, Medication administration, Evaluate responses  to treatment, Monitor vital signs and CBGs as ordered, Perform/monitor CIWA, COWS, AIMS and Fall Risk screenings as ordered, Perform wound care treatments as ordered.  Evaluation of Outcomes: Progressing   LCSW Treatment Plan for Primary Diagnosis: Psychosis Long Term Goal(s): Safe transition to appropriate next level of care at discharge, Engage patient in therapeutic group addressing interpersonal concerns.  Short Term Goals: Engage patient in aftercare planning with referrals and resources, Facilitate acceptance of mental health diagnosis and concerns, Identify triggers associated with mental health/substance abuse issues and Increase skills for wellness and recovery  Therapeutic Interventions: Assess for all discharge needs, 1 to 1 time with Social worker, Explore available resources and support systems, Assess for adequacy in community support network, Educate family and significant other(s) on suicide prevention, Complete Psychosocial Assessment, Interpersonal group therapy.  Evaluation of Outcomes: Progressing    Recreational Therapy Treatment Plan for Primary Diagnosis: Psychosis Long Term Goal(s): Interest or engagement in recreation therapeutic interventions will improve  Short Term Goals: Increase healthy decision making, Increase stress management skills  Treatment Modalities: Group Therapy and Individual Treatment Sessions  Therapeutic Interventions: Psychoeducation  Evaluation of Outcomes: Progressing   Progress in Treatment: Attending groups: Yes. Participating in groups: Yes. Taking medication as prescribed: Yes. Toleration medication: Yes. Family/Significant other contact made: No, will contact:  mother, Tresa Endo. Patient understands diagnosis: Yes. Discussing patient identified problems/goals with staff: Yes. Medical problems stabilized or resolved: Yes. Denies suicidal/homicidal ideation: Yes. Issues/concerns per patient self-inventory: No.  New problem(s)  identified: No, Describe:  None identified.  Discharge Plan or Barriers: [facility] at [facility]   Reason for Continuation of Hospitalization: Anxiety Delusions  Withdrawal symptoms  Estimated Length of Stay: D/C 12/17/2016  Attendees: Patient: Shannon Patel 12/16/2016 11:07  AM  Physician: Dr. Radene JourneyAndrea Hernandez, MD 12/16/2016 11:07 AM  Nursing: Elenore PaddyJennifer Morrow, RN 12/16/2016 11:07 AM  RN Care Manager: 12/16/2016 11:07 AM  Social Worker: Hampton AbbotKadijah Grant, MSW, LCSW-A 12/16/2016 11:07 AM  Recreational Therapist: Hershal CoriaBeth Erica Richwine, LRT, CTRS  12/16/2016 11:07 AM    Scribe for Treatment Team: Lynden OxfordKadijah R Grant, LCSWA 12/16/2016 11:07 AM
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
## 2016-12-16 NOTE — Discharge Summary (Signed)
Physician Discharge Summary Note  Patient:  Shannon Patel is an 36 y.o., female MRN:  [id number] DOB:  Jul 26, 1981 Patient phone:  [phone] (home)  Patient address:   [address] [place] [address],  Total Time spent with patient: 30 minutes  Date of Admission:  12/14/2016 Date of Discharge: 12/17/16  Reason for Admission:  Mania and psychosis  Principal Problem: Psychosis Discharge Diagnoses: Patient Active Problem List   Diagnosis Date Noted  . 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 Alcohol use disorder, moderate, in sustained remission ([place]) [F10.21] 12/14/2016  . unspecified schizophrenia spectrum  disorder [F29] 12/14/2016  . Tobacco use disorder [F17.200] 07/30/2016     History of Present Illness:   Patient is a 36 year old single Caucasian female who presented to our emergency department by police on February 4. Per the ER notes the patient displays flight of ideas, pressured speech, delusional thinking, she was seen interacting to internal stimuli.  She was petitioned for evaluation by family for threatening her mother.  Patient has history of multiple psychiatric admissions due to substance-induced psychosis. She has a past history of methamphetamine abuse along with alcohol dependence. In the ER her urine toxicology screen was positive for amphetamines. Alcohol level was below the detection limit.  Patient was interviewed today. She however was a poor historian; she had received medications prior to assessment and therefore she fell asleep multiple times.   Patient reports abusing methamphetamines a couple times a week. She denies the use of any other illicit substances or abusing prescription medications or alcohol. She smokes about half a pack of cigarettes per day.  She denied problems with mood appetite, or energy. She complained that for the last week she is having a lot of trouble with insomnia and has been having great  difficulties with concentration. She complains that her concentration issues are severe that she has been using the methamphetamine for it. Patient reports having auditory hallucinations, she reports hearing the voice of friends that are encouraging her and supporting her.  Trauma history patient claims that she has been diagnosed with PTSD in the past however we were unable to elicit any history of traumatic events.  Per records she has been hospitalized in [facility] behavioral health and also in our unit in the past. She has been diagnosed with substance-induced mood disorder. Appears that she has not been compliant with follow-up of medications. Last hospitalization per our records was in September of last year  Associated Signs/Symptoms: Depression Symptoms:  depressed mood, insomnia, (Hypo) Manic Symptoms:  Distractibility, Impulsivity, Irritable Mood, Anxiety Symptoms:  Excessive Worry, Psychotic Symptoms:  Delusions, Hallucinations: Auditory Paranoia, PTSD Symptoms: NA Total Time spent with patient: 1 hour  Past Psychiatric History: Multiple psychiatric hospitalizations. The records. However the patient provides conflicting information as she says his only being hospitalized one time before. Appears that she has received significant long-term treatment for substance abuse in the past. Currently noncompliant with any medications or any follow up. Denies any history of suicidal attempts in the past.  Past Medical History:  Past Medical History:  Diagnosis Date  . Bipolar 1 disorder ([place])    History reviewed. No pertinent surgical history.  Family History: History reviewed. No pertinent family history.  Family Psychiatric  History: Patient reports that her father passed away 8 years ago due to cirrhosis. She reports that her mother had issues with depression. She denies any history of suicides in her family  Social History: Patient is single, never married. She  has 3  children ages 70, 77 and 79 year old here all her children are in foster care. Patient claims that she was living alone in a trailer prior to admission. There are other notes that stated she was living with her mother. Patient has one brother and one sister. Her parents divorced when she was young. States that she has been unemployed for at least 6 months in the past she used to work at a [employer]. Reports has legal charges for DWI in the past History  Alcohol Use  . 3.0 oz/week  . 5 Glasses of wine per week    Comment: No use in 30 days     History  Drug Use  . Types: Amphetamines    Comment: Snorted drugs 3 days ago unsure of what it was    Social History   Social History  . Marital status: Married    Spouse name: N/A  . Number of children: N/A  . Years of education: N/A   Social History Main Topics  . Smoking status: Current Every Day Smoker    Packs/day: 0.50    Years: 10.00    Types: Cigarettes  . Smokeless tobacco: Current User  . Alcohol use 3.0 oz/week    5 Glasses of wine per week     Comment: No use in 30 days  . Drug use: Yes    Types: Amphetamines     Comment: Snorted drugs 3 days ago unsure of what it was  . Sexual activity: Not Currently    Birth control/ protection: Abstinence   Other Topics Concern  . None   Social History Narrative  . None    Hospital Course:    36 year old Caucasian female admitted with what appears to be substance-induced psychotic episode. She has past history of similar episodes in symptoms. Records she has a long history of addiction to methamphetamines.  Psychosis/mania: continue  Risperdal 1 mg by mouth twice a day. Still appears to have some mania (mild) but improving  Insomnia: continue  trazodone 50 mg by mouth daily at bedtime when necessary  Methamphetamine use disorder: Patient in need of intensive residential or outpatient substance abuse treatment upon discharge  Tobacco use disorder : pt received nicotine patch  21 mg a day  Hypokalemia: received 3 doses of kdur 40 meq   GERD: continue Pepcid 20 mg daily  Asthma: continue  albuterol when necessary  Labs:  hemoglobin A1c, lipid panel, TSH urine pregnancy and a UA---all wnl  EKG: QTC 452 sinus tachycardia  Disposition back with her mother   Follow-up : [facility] intensive outpatient substance abuse  Records from prior hospitalizations at being reviewed. Patient also has visits to [facility] crisis unit  This hospitalization was uneventful. Patient did not require seclusion, restraints or forced medications. She did not display any unsafe or disruptive behaviors. During the day and initial presentation there was some evidence of pressure speech and psychomotor retardation, her mood appeared mildly euphoric and there was restlessness. She however was not disruptive, bizarre or aggressive here. She participated in programming and was appropriate. She had appropriate interactions with peers and with staff.  Patient reports significant improvement in her thought process. States that she is no longer having hallucinations. Visual or auditory. She denies problems with mood, appetite, energy, sleep or concentration. She denies suicidality, homicidality or auditory or visual hallucinations. She denies any side effects from medications. She denies having any physical complaints.  Patient was seen in treatment team and the  case was also discussed with nursing and Education officer, museum. They do not have any concern about the patient's safety upon discharge.  Family has been contacted by Education officer, museum. He does not have any concerns about her safety at this point. They have confirm patient does not have any access to guns  Patient will receive 7 days of free medications plus a prescription for 30 days. Referral will be made to the medication management clinic   Physical Findings: AIMS: Facial and Oral Movements Muscles of Facial Expression: None, normal Lips and  Perioral Area: None, normal Jaw: None, normal Tongue: None, normal,Extremity Movements Upper (arms, wrists, hands, fingers): None, normal Lower (legs, knees, ankles, toes): None, normal, Trunk Movements Neck, shoulders, hips: None, normal, Overall Severity Severity of abnormal movements (highest score from questions above): None, normal Incapacitation due to abnormal movements: None, normal Patient's awareness of abnormal movements (rate only patient's report): No Awareness, Dental Status Current problems with teeth and/or dentures?: No Does patient usually wear dentures?: No  CIWA:    COWS:  COWS Total Score: 0  Musculoskeletal: Strength & Muscle Tone: within normal limits Gait & Station: normal Patient leans: N/A  Psychiatric Specialty Exam: Physical Exam  Constitutional: She is oriented to person, place, and time. She appears well-developed and well-nourished.  HENT:  Head: Normocephalic and atraumatic.  Eyes: Conjunctivae are normal.  Neck: Normal range of motion.  Respiratory: Effort normal.  Musculoskeletal: Normal range of motion.  Neurological: She is alert and oriented to person, place, and time.    Review of Systems  Constitutional: Negative.   HENT: Negative.   Eyes: Negative.   Respiratory: Negative.   Cardiovascular: Negative.   Gastrointestinal: Negative.   Genitourinary: Negative.   Musculoskeletal: Negative.   Skin: Negative.   Neurological: Negative.   Endo/Heme/Allergies: Negative.   Psychiatric/Behavioral: Positive for substance abuse. Negative for depression, hallucinations, memory loss and suicidal ideas. The patient is not nervous/anxious and does not have insomnia.     Blood pressure 111/68, pulse 84, temperature 98 F (36.7 C), temperature source Oral, resp. rate 18, height _0  (1.6 m), weight 71.7 kg (158 lb), SpO2 100 %.Body mass index is 27.99 kg/m.  General Appearance: Well Groomed  Eye Contact:  Good  Speech:  Clear and Coherent   Volume:  Normal  Mood:  Euthymic  Affect:  Appropriate and Congruent  Thought Process:  Linear and Descriptions of Associations: Intact  Orientation:  Full (Time, Place, and Person)  Thought Content:  Hallucinations: None  Suicidal Thoughts:  No  Homicidal Thoughts:  No  Memory:  Immediate;   Good Recent;   Good Remote;   Good  Judgement:  Fair  Insight:  Fair  Psychomotor Activity:  Normal  Concentration:  Concentration: Good and Attention Span: Good  Recall:  Good  Fund of Knowledge:  Good  Language:  Good  Akathisia:  No  Handed:    AIMS (if indicated):     Assets:  Communication Skills Physical Health  ADL's:  Intact  Cognition:  WNL  Sleep:  Number of Hours: 8     Have you used any form of tobacco in the last 30 days? (Cigarettes, Smokeless Tobacco, Cigars, and/or Pipes): Yes  Has this patient used any form of tobacco in the last 30 days? (Cigarettes, Smokeless Tobacco, Cigars, and/or Pipes) Yes, Yes, A prescription for an FDA-approved tobacco cessation medication was offered at discharge and the patient refused  Blood Alcohol level:  Lab Results  Component Value Date  ETH <5 12/13/2016   ETH <5 16/08/9603    Metabolic Disorder Labs:  Lab Results  Component Value Date   HGBA1C 4.9 12/15/2016   MPG 94 12/15/2016   MPG 97 07/31/2016   No results found for: PROLACTIN Lab Results  Component Value Date   CHOL 258 (H) 12/15/2016   TRIG 83 12/15/2016   HDL 35 (L) 12/15/2016   CHOLHDL 7.4 12/15/2016   VLDL 17 12/15/2016   LDLCALC 206 (H) 12/15/2016   LDLCALC 150 (H) 07/31/2016   Results for AIESHA, LELAND (MRN [id number]) as of 12/17/2016 14:02  Ref. Range 12/13/2016 19:14 12/13/2016 19:24 12/15/2016 06:38  COMPREHENSIVE METABOLIC PANEL Unknown  Rpt (A)   Sodium Latest Ref Range: 135 - 145 mmol/L  138   Potassium Latest Ref Range: 3.5 - 5.1 mmol/L  2.9 (L)   Chloride Latest Ref Range: 101 - 111 mmol/L  101   CO2 Latest Ref Range: 22 - 32 mmol/L  26   Glucose  Latest Ref Range: 65 - 99 mg/dL  104 (H)   Mean Plasma Glucose Latest Units: mg/dL   94  BUN Latest Ref Range: 6 - 20 mg/dL  7   Creatinine Latest Ref Range: 0.44 - 1.00 mg/dL  0.87   Calcium Latest Ref Range: 8.9 - 10.3 mg/dL  9.6   Anion gap Latest Ref Range: 5 - 15   11   Alkaline Phosphatase Latest Ref Range: 38 - 126 U/L  75   Albumin Latest Ref Range: 3.5 - 5.0 g/dL  4.8   AST Latest Ref Range: 15 - 41 U/L  32   ALT Latest Ref Range: 14 - 54 U/L  20   Total Protein Latest Ref Range: 6.5 - 8.1 g/dL  8.7 (H)   Total Bilirubin Latest Ref Range: 0.3 - 1.2 mg/dL  0.9   EGFR (African American) Latest Ref Range: >60 mL/min  >60   EGFR (Non-African Amer.) Latest Ref Range: >60 mL/min  >60   Total CHOL/HDL Ratio Latest Units: RATIO   7.4  Cholesterol Latest Ref Range: 0 - 200 mg/dL   258 (H)  HDL Cholesterol Latest Ref Range: >40 mg/dL   35 (L)  LDL (calc) Latest Ref Range: 0 - 99 mg/dL   206 (H)  Triglycerides Latest Ref Range: <150 mg/dL   83  VLDL Latest Ref Range: 0 - 40 mg/dL   17  WBC Latest Ref Range: 3.6 - 11.0 K/uL  10.8   RBC Latest Ref Range: 3.80 - 5.20 MIL/uL  5.17   Hemoglobin Latest Ref Range: 12.0 - 16.0 g/dL  14.8   HCT Latest Ref Range: 35.0 - 47.0 %  43.7   MCV Latest Ref Range: 80.0 - 100.0 fL  84.5   MCH Latest Ref Range: 26.0 - 34.0 pg  28.6   MCHC Latest Ref Range: 32.0 - 36.0 g/dL  33.9   RDW Latest Ref Range: 11.5 - 14.5 %  13.1   Platelets Latest Ref Range: 150 - 440 K/uL  434   Acetaminophen (Tylenol), S Latest Ref Range: 10 - 30 ug/mL  <47 (L)   Salicylate Lvl Latest Ref Range: 2.8 - 30.0 mg/dL  <7.0   Hemoglobin A1C Latest Ref Range: 4.8 - 5.6 %   4.9  Preg Test, Ur Latest Ref Range: NEGATIVE   NEGATIVE   TSH Latest Ref Range: 0.350 - 4.500 uIU/mL   1.166  Appearance Latest Ref Range: CLEAR   CLEAR (A)   Bacteria, UA Latest  Ref Range: NONE SEEN   NONE SEEN   Bilirubin Urine Latest Ref Range: NEGATIVE   NEGATIVE   Color, Urine Latest Ref Range: YELLOW    YELLOW (A)   Glucose Latest Ref Range: NEGATIVE mg/dL  NEGATIVE   Hgb urine dipstick Latest Ref Range: NEGATIVE   SMALL (A)   Ketones, ur Latest Ref Range: NEGATIVE mg/dL  20 (A)   Leukocytes, UA Latest Ref Range: NEGATIVE   NEGATIVE   Nitrite Latest Ref Range: NEGATIVE   NEGATIVE   pH Latest Ref Range: 5.0 - 8.0   6.0   Protein Latest Ref Range: NEGATIVE mg/dL  NEGATIVE   RBC / HPF Latest Ref Range: 0 - 5 RBC/hpf  0-5   Specific Gravity, Urine Latest Ref Range: 1.005 - 1.030   1.004 (L)   Squamous Epithelial / LPF Latest Ref Range: NONE SEEN   0-5 (A)   WBC, UA Latest Ref Range: 0 - 5 WBC/hpf  0-5   Alcohol, Ethyl (B) Latest Ref Range: <5 mg/dL  <5   Amphetamines, Ur Screen Latest Ref Range: NONE DETECTED   POSITIVE (A)   Barbiturates, Ur Screen Latest Ref Range: NONE DETECTED   NONE DETECTED   Benzodiazepine, Ur Scrn Latest Ref Range: NONE DETECTED   NONE DETECTED   Cocaine Metabolite,Ur [place] Latest Ref Range: NONE DETECTED   NONE DETECTED   Methadone Scn, Ur Latest Ref Range: NONE DETECTED   NONE DETECTED   MDMA (Ecstasy)Ur Screen Latest Ref Range: NONE DETECTED   NONE DETECTED   Cannabinoid 50 Ng, Ur  Latest Ref Range: NONE DETECTED   NONE DETECTED   Opiate, Ur Screen Latest Ref Range: NONE DETECTED   NONE DETECTED   Phencyclidine (PCP) Ur S Latest Ref Range: NONE DETECTED   NONE DETECTED   Tricyclic, Ur Screen Latest Ref Range: NONE DETECTED   NONE DETECTED   EKG 12-LEAD Unknown Rpt     See Psychiatric Specialty Exam and Suicide Risk Assessment completed by Attending Physician prior to discharge.  Discharge destination:  Home  Is patient on multiple antipsychotic therapies at discharge:  No   Has Patient had three or more failed trials of antipsychotic monotherapy by history:  No  Recommended Plan for Multiple Antipsychotic Therapies: NA   Allergies as of 12/16/2016   No Known Allergies     Medication List    STOP taking these medications   FLUoxetine 40 MG  capsule Commonly known as:  PROZAC   nicotine polacrilex 2 MG gum Commonly known as:  NICORETTE     TAKE these medications     Indication  albuterol 108 (90 Base) MCG/ACT inhaler Commonly known as:  PROVENTIL HFA;VENTOLIN HFA Inhale 2 puffs into the lungs every 6 (six) hours as needed.  Indication:  Disease Involving Spasms of the Bronchus   famotidine 20 MG tablet Commonly known as:  PEPCID Take 20 mg by mouth daily.  Indication:  Gastroesophageal Reflux Disease   risperiDONE 1 MG tablet Commonly known as:  RISPERDAL Take 1 tablet (1 mg total) by mouth 2 (two) times daily.  Indication:  psychosis   traZODone 100 MG tablet Commonly known as:  DESYREL Take 1 tablet (100 mg total) by mouth at bedtime.  Indication:  Trouble Sleeping      >30 minutes. >50 % of the time was spent in coordination of care.    Signed: Hildred Priest, MD 12/16/2016, 1:14 PM
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
## 2016-12-16 NOTE — Progress Notes (Signed)
Recreation Therapy Notes  Date: 02.07.18 Time: 9:30 am Location: Craft Room  Group Topic: Self-esteem  Goal Area(s) Addresses:  Patient will identify at least one positive trait about self. Patient will verbalize benefit of having healthy self-esteem.  Behavioral Response: Arrived late, Attentive, Interactive  Intervention: I Am  Activity: Patients were given a worksheet with the letter I on it and were instructed to write as many positive traits about themselves inside the letter.  Education: LRT educated patients on ways they can increase their self-esteem.  Education Outcome: Acknowledges education/In group clarification offered   Clinical Observations/Feedback: Patient arrived to group at approximately 9:47 am. LRT explained activity. Patient wrote positive traits. Patient contributed to discussion by stating that it was easy for her to think of positive traits and why, that it can be difficult to think of positive traits and why, how her self-esteem affects her, and how she can increase her self-esteem.  Jacquelynn CreeGreene,Selisa Tensley M, LRT/CTRS 12/16/2016 10:12 AM

## 2016-12-16 NOTE — [facility] Group Notes (Signed)
[facility] Group Notes:  (Nursing/MHT/Case Management/Adjunct)  Date:  12/16/2016  Time:  7:06 AM  Type of Therapy:  Psychoeducational Skills  Participation Level:  Active  Participation Quality:  Appropriate  Affect:  Appropriate  Cognitive:  Appropriate  Insight:  Good  Engagement in Group:  Engaged  Modes of Intervention:  Activity  Summary of Progress/Problems:  Mayra NeerJackie L Kouper Spinella 12/16/2016, 7:06 AM

## 2016-12-17 NOTE — [facility] Group Notes (Signed)
[facility] LCSW Group Therapy  12/17/2016 1:52 PM  Type of Therapy:  Group Therapy  Participation Level:  Active  Participation Quality:  Appropriate  Affect:  Appropriate  Cognitive:  Alert  Insight:  Improving  Engagement in Therapy:  Engaged  Modes of Intervention:  Discussion, Education, Problem-solving, Reality Testing and Support  Summary of Progress/Problems: Balance in life: Patients will discuss the concept of balance and how it looks and feels to be unbalanced. Pt will identify areas in their life that is unbalanced and ways to become more balanced. They discussed what aspects in their lives has influenced their self care. Patients also discussed self care in the areas of self regulation/control, hygiene/appearance, sleep/relaxation, healthy leisure, healthy eating habits, exercise, inner peace/spirituality, self improvement, sobriety, and health management. They were challenged to identify changes that are needed in order to improve self care. Patient discussed her discharge today and working with her outpatient provider in therapy. CSW provided support to patient.    Abdulhadi Stopa G. Garnette CzechSampson MSW, LCSWA 12/17/2016, 1:53 PM

## 2016-12-17 NOTE — Plan of Care (Signed)
Problem: Coping: Goal: Ability to verbalize frustrations and anger appropriately will improve Outcome: Progressing Patient able to verbalize frustrations at this time [place] RN   

## 2016-12-17 NOTE — Progress Notes (Signed)
Pleasant and cooperative.  Bright affect. Denies SI/HI/AVH. Discharge instructions given, verbalized understanding.  Prescriptions and seven day supply of medications given.  Personal belongings returned.  Escorted off unit  to meet mother to travel home.

## 2016-12-17 NOTE — [facility] Group Notes (Signed)
[facility] LCSW Group Therapy Note  Type of Therapy and Topic:  Group Therapy:  Goals Group: SMART Goals  Participation Level:  Patient attended group on this date and minimally participated in the group discussion.   Description of Group:   The purpose of a daily goals group is to assist and guide patients in setting recovery/wellness-related goals.  The objective is to set goals as they relate to the crisis in which they were admitted. Patients will be using SMART goal modalities to set measurable goals.  Characteristics of realistic goals will be discussed and patients will be assisted in setting and processing how one will reach their goal. Facilitator will also assist patients in applying interventions and coping skills learned in psycho-education groups to the SMART goal and process how one will achieve defined goal.  Therapeutic Goals: -Patients will develop and document one goal related to or their crisis in which brought them into treatment. -Patients will be guided by LCSW using SMART goal setting modality in how to set a measurable, attainable, realistic and time sensitive goal.  -Patients will process barriers in reaching goal. -Patients will process interventions in how to overcome and successful in reaching goal.   Summary of Patient Progress:  Patient Goal: "To keep my follow-up appointments when I discharge". CSW offered support to patient and processed how to work towards the goal.    Therapeutic Modalities:   Motivational Interviewing Engineer, agriculturalCognitive Behavioral Therapy Crisis Intervention Model SMART goals setting  Shannon Patel MSW, LCSWA 12/17/2016 10:16 AM

## 2016-12-17 NOTE — Progress Notes (Signed)
Recreation Therapy Notes  INPATIENT RECREATION TR PLAN  Patient Details Name: Shannon Patel MRN: [id number] DOB: 10/14/1981 Today's Date: 12/17/2016  Rec Therapy Plan Is patient appropriate for Therapeutic Recreation?: Yes Treatment times per week: At least once a week TR Treatment/Interventions: 1:1 session, Group participation (Comment) (Appropriate participation in daily recreational therapy tx)  Discharge Criteria Pt will be discharged from therapy if:: Treatment goals are met, Discharged Treatment plan/goals/alternatives discussed and agreed upon by:: Patient/family  Discharge Summary Short term goals set: See Care Plan Short term goals met: Complete Progress toward goals comments: One-to-one attended Which groups?: Self-esteem, Leisure education, Other (Comment) (Self-expression) One-to-one attended: Decision making, stress management Reason goals not met: N/A Therapeutic equipment acquired: None Reason patient discharged from therapy: Discharge from hospital Pt/family agrees with progress & goals achieved: Yes Date patient discharged from therapy: 12/17/16   Leonette Monarch, LRT/CTRS 12/17/2016, 3:33 PM

## 2016-12-17 NOTE — Progress Notes (Signed)
D: Patient is alert and oriented on the unit this shift. Patient attended and   participated in groups today. Patient denies suicidal ideation, homicidal ideation, auditory or visual hallucinations at the present time.  A: Scheduled medications are administered to patient as per MD orders. Emotional support and encouragement are provided. Patient is maintained on q.15 minute safety checks. Patient is informed to notify staff with questions or concerns. R: No adverse medication reactions are noted. Patient is cooperative with medication administration and treatment plan today. Patient is receptive, calm and cooperative on the unit at this time. Patient does not interact  with others on the unit this shift. Patient contracts for safety at this time. Patient remains safe at this time. Anxiety 0/10 Depression 0/10

## 2016-12-17 NOTE — Plan of Care (Signed)
Problem: [facility] Participation in Recreation Therapeutic Interventions Goal: STG-Other Recreation Therapy Goal (Specify) STG: Decision Making - Within 4 treatment sessions, patient will verbalize understanding of the decision making charts in each of 2 treatment sessions to increase healthy decision making skills.  Outcome: Completed/Met Date Met: 12/17/16 Treatment Session 2; Completed 2 out of 2: At approximately 9:05 am, LRT met with patient in consultation room. Patient verbalized understanding of the decision making charts. LRT encouraged patient to use the charts post d/c.  Leonette Monarch, LRT/CTRS 02.08.18 11:40 am  Problem: [facility] Participation in Recreation Therapeutic Interventions Goal: STG-Other Recreation Therapy Goal (Specify) STG: Stress Management - Within 4 treatment sessions, patient will verbalize understanding of the stress management techniques in each of 2 treatment sessions to increase stress management skills post d/c.  Outcome: Completed/Met Date Met: 12/17/16 Treatment Session 2; Completed 2 out of 2: At approximately 9:05 am, LRT met with patient in consultation room. Patient reported she practiced the stress management techniques last night. Patient verbalized understanding and reported the techniques were helpful. LRT encouraged patient to continue practicing the stress management techniques.  Leonette Monarch, LRT/CTRS 02.08.18 11:41 am

## 2016-12-17 NOTE — Progress Notes (Signed)
Recreation Therapy Notes  Date: 02.08.18 Time: 9:30 am Location: Craft Room  Group Topic: Leisure Education  Goal Area(s) Addresses:  Patient will identify things they are grateful for. Patient will identify how being grateful can influence decision making.  Behavioral Response: Attentive, Interactive, Left early  Intervention: Grateful Wheel  Activity: Patients were given an I Am Grateful For worksheet and were instructed to write things they are grateful for under each category.  Education: LRT educated patients on leisure.  Education Outcome: Acknowledges education/In group clarification offered  Clinical Observations/Feedback: Patient wrote things she is grateful for. Patient contributed to group discussion by stating things she is grateful for, what category she has more things written in, and that she does not participate in leisure often. Patient left group at approximately 10:00 am with the PA student. Patient did not return to group.  Jacquelynn CreeGreene,Roney Youtz M, LRT/CTRS 12/17/2016 10:22 AM

## 2016-12-17 NOTE — Progress Notes (Signed)
  [facility] Adult Case Management Discharge Plan :  Will you be returning to the same living situation after discharge:  Yes,  home with mother. At discharge, do you have transportation home?: Yes,  mother will pick pt up. Do you have the ability to pay for your medications: Yes,  medication management clinic.  Release of information consent forms completed and in the chart;  Patient's signature needed at discharge.  Patient to Follow up at: Follow-up Information    [facility]. Go on 12/18/2016.   Why:  Please plan to meet with Unk PintoHarvey Bryant [phone] from [facility] on Feb. 9th @ 7:15 AM. Bring your discharge paperwork to this appointment for medication mgt and therapy.  Contact information: Address: [address]  [place], [place] [address] Phone: [phone] Fax: [phone]           Next level of care provider has access to [facility] Link:no  Safety Planning and Suicide Prevention discussed: Yes,  SPE completed with patient and mother.  Have you used any form of tobacco in the last 30 days? (Cigarettes, Smokeless Tobacco, Cigars, and/or Pipes): Yes  Has patient been referred to the Quitline?: Patient refused referral  Patient has been referred for addiction treatment: Yes  Lynden OxfordKadijah R Tamsin Nader, MSW, LCSW-A 12/17/2016, 1:42 PM

## 2017-01-15 ENCOUNTER — Ambulatory Visit: Payer: No Typology Code available for payment source | Admitting: Pharmacy Technician

## 2017-01-15 ENCOUNTER — Encounter (INDEPENDENT_AMBULATORY_CARE_PROVIDER_SITE_OTHER): Payer: Self-pay

## 2017-01-15 DIAGNOSIS — Z79899 Other long term (current) drug therapy: Secondary | ICD-10-CM

## 2017-01-15 NOTE — Progress Notes (Signed)
Met with patient completed financial assistance application for [facility] due to recent hospital visit.  Patient agreed to be responsible for gathering financial information and forwarding to appropriate department in [place].    Completed [facility] application and contract.  Patient agreed to all terms of the [facility] contract.  Patient to provide notarized letter of support and utility bill.  Provided patient with community resource material based on her particular needs.   Referred patient to [facility].    J.  Care Manager [facility]  

## 2017-03-22 ENCOUNTER — Telehealth: Payer: Self-pay | Admitting: Pharmacy Technician

## 2017-03-22 NOTE — Telephone Encounter (Signed)
Patient failed to provide proof of income.  [facility] unable to provide ongoing medication assistance until eligibility is determined.  Sherilyn DacostaBetty J. Kluttz Care Manager [facility]

## 2017-04-28 ENCOUNTER — Encounter: Payer: Self-pay | Admitting: Emergency Medicine

## 2017-04-28 ENCOUNTER — Emergency Department
Admission: EM | Admit: 2017-04-28 | Discharge: 2017-04-28 | Disposition: A | Discharge status: Home / Self Care | Payer: No Typology Code available for payment source | Attending: Emergency Medicine | Admitting: Emergency Medicine

## 2017-04-28 ENCOUNTER — Ambulatory Visit (HOSPITAL_COMMUNITY)
Admission: EM | Admit: 2017-04-28 | Discharge: 2017-04-28 | Disposition: A | Discharge status: Home / Self Care | Payer: No Typology Code available for payment source | Source: Ambulatory Visit | Attending: Emergency Medicine | Admitting: Emergency Medicine

## 2017-04-28 DIAGNOSIS — R58 Hemorrhage, not elsewhere classified: Secondary | ICD-10-CM | POA: Insufficient documentation

## 2017-04-28 DIAGNOSIS — Z0441 Encounter for examination and observation following alleged adult rape: Secondary | ICD-10-CM | POA: Insufficient documentation

## 2017-04-28 DIAGNOSIS — F1990 Other psychoactive substance use, unspecified, uncomplicated: Secondary | ICD-10-CM | POA: Insufficient documentation

## 2017-04-28 DIAGNOSIS — F17210 Nicotine dependence, cigarettes, uncomplicated: Secondary | ICD-10-CM | POA: Insufficient documentation

## 2017-04-28 LAB — COMPREHENSIVE METABOLIC PANEL
ALBUMIN: 4 g/dL (ref 3.5–5.0)
ALT: 21 U/L (ref 14–54)
AST: 29 U/L (ref 15–41)
Alkaline Phosphatase: 56 U/L (ref 38–126)
Anion gap: 5 (ref 5–15)
BUN: 11 mg/dL (ref 6–20)
CHLORIDE: 101 mmol/L (ref 101–111)
CO2: 31 mmol/L (ref 22–32)
Calcium: 8.9 mg/dL (ref 8.9–10.3)
Creatinine, Ser: 0.71 mg/dL (ref 0.44–1.00)
GFR calc Af Amer: >60 mL/min (ref >60)
GFR calc non Af Amer: >60 mL/min (ref >60)
GLUCOSE: 91 mg/dL (ref 65–99)
POTASSIUM: 2.8 mmol/L — AB (ref 3.5–5.1)
SODIUM: 137 mmol/L (ref 135–145)
Total Bilirubin: 0.7 mg/dL (ref 0.3–1.2)
Total Protein: 7.1 g/dL (ref 6.5–8.1)

## 2017-04-28 LAB — RAPID HIV SCREEN (HIV 1/2 AB+AG)
HIV 1/2 ANTIBODIES: NONREACTIVE
HIV-1 P24 ANTIGEN - HIV24: NONREACTIVE

## 2017-04-28 MED ORDER — ULIPRISTAL ACETATE 30 MG PO TABS
30.0000 mg | ORAL_TABLET | Freq: Once | ORAL | Status: AC
  Administered 2017-04-28: 30 mg via ORAL
  Filled 2017-04-28: qty 1

## 2017-04-28 MED ORDER — PROMETHAZINE HCL 25 MG PO TABS: 25.0000 mg | ORAL_TABLET | Freq: Four times a day (QID) | ORAL | Status: DC | PRN

## 2017-04-28 MED ORDER — METRONIDAZOLE 500 MG PO TABS
2000.0000 mg | ORAL_TABLET | Freq: Once | ORAL | Status: AC
  Administered 2017-04-28: 2000 mg via ORAL
  Filled 2017-04-28: qty 4

## 2017-04-28 MED ORDER — LIDOCAINE HCL (PF) 1 % IJ SOLN
0.9000 mL | Freq: Once | INTRAMUSCULAR | Status: AC
  Administered 2017-04-28: 0.9 mL
  Filled 2017-04-28: qty 5

## 2017-04-28 MED ORDER — POTASSIUM CHLORIDE CRYS ER 20 MEQ PO TBCR
40.0000 meq | EXTENDED_RELEASE_TABLET | Freq: Once | ORAL | Status: AC
  Administered 2017-04-28: 40 meq via ORAL
  Filled 2017-04-28: qty 2

## 2017-04-28 MED ORDER — AZITHROMYCIN 500 MG PO TABS
1000.0000 mg | ORAL_TABLET | Freq: Once | ORAL | Status: AC
  Administered 2017-04-28: 1000 mg via ORAL
  Filled 2017-04-28: qty 2

## 2017-04-28 MED ORDER — CEFTRIAXONE SODIUM 250 MG IJ SOLR
250.0000 mg | Freq: Once | INTRAMUSCULAR | Status: AC
  Administered 2017-04-28: 250 mg via INTRAMUSCULAR
  Filled 2017-04-28: qty 250

## 2017-04-28 MED ORDER — FLUCONAZOLE 50 MG PO TABS
150.0000 mg | ORAL_TABLET | Freq: Once | ORAL | Status: AC
  Administered 2017-04-28: 150 mg via ORAL
  Filled 2017-04-28: qty 1

## 2017-04-28 NOTE — ED Notes (Signed)
[phone] [facility], pt needs to call when able to request shelter.

## 2017-04-28 NOTE — ED Notes (Signed)
Pt resting in bed, lights dimmed for patient comfort. Respirations even and unlabored. Will continue to monitor for further patient needs. VSS and WNL.

## 2017-04-28 NOTE — ED Notes (Signed)
Pt taken to SANE room at this time.

## 2017-04-28 NOTE — ED Notes (Signed)
Pt given phone to call [facility] and to speak with Sane Nurse per Marisue IvanLiz.
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
## 2017-04-28 NOTE — Progress Notes (Signed)
CSW engaged with Patient at her bedside to discuss homelessness concerns. Patient very lethargic and had to be awakened several times throughout CSW visit. Patient reports that she would like to go to a shelter at discharge but notes that she does not want to go to a shelter with men. Patient reports that prior to admission, [place] [facility] but notes that she   Per previous notes, Patient was to call  [facility] directly regarding admission to their women's shelter. CSW inquired as to whether or not Patient had made this phone call. Per Patient, she has not been able to reach anyone. CSW assisted Patient with contacting [facility]. Per director, Clearnce SorrelKatie Smith, the women's shelter is currently full and requests that the Patient contact them again on tomorrow in the event that a bed opens up. CSW also has contacted [place] womens [place] and is awaiting return phone .   Amy, Sane RN and Elnita Maxwellheryl, RN Case Manager updated. CSW continues to follow.    Enos FlingAshley Cord Wilczynski, MSW, LCSW [facility] Clinical Social Worker [phone]

## 2017-04-28 NOTE — ED Notes (Signed)
NAD noted at time of D/C. Pt denies questions or concerns. Pt ambulatory to the lobby at this time. Pt D/C into the care of a friend.
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
## 2017-04-28 NOTE — ED Notes (Signed)
Pt continues to rest in bed, respirations even and unlabored. Will continue to monitor for further patient needs at this time.

## 2017-04-28 NOTE — Discharge Instructions (Signed)
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 What may interact with this medicine? Do not take this medicine with any of the following medications: -cisapride -dofetilide -dronedarone -MAOIs like  Carbex, Eldepryl, Marplan, Nardil, Parnate -pimozide -quinidine, including dextromethorphan; quinidine -thioridazine -ziprasidone This medicine may also interact with the following medications: -certain medicines for depression, anxiety, or psychotic disturbances -certain medicines for anxiety or sleep -certain medicines for seizures like carbamazepine, phenobarbital, phenytoin -certain medicines for movement abnormalities as in Parkinson's disease, or for gastrointestinal problems -epinephrine -medicines for allergies or colds -muscle relaxants -narcotic medicines for pain -other medicines that prolong the QT interval (cause an abnormal heart rhythm) -tramadol -trimethobenzamide This list may not describe all possible interactions. Give your health care provider a list of all the medicines, herbs, non-prescription drugs, or dietary  supplements you use. Also tell them if you smoke, drink alcohol, or use illegal drugs. Some items may interact with your medicine. What should I watch for while using this medicine? Tell your doctor or health care professional if your symptoms do not start to get better in 1 to 2 days. You may get drowsy or dizzy. Do not drive, use machinery, or do anything that needs mental alertness until you know how this medicine affects you. To reduce the risk of dizzy or fainting spells, do not stand or sit up quickly, especially if you are an older patient. Alcohol may increase dizziness and drowsiness. Avoid alcoholic drinks. Your mouth may get dry. Chewing sugarless gum or sucking hard candy, and drinking plenty of water may help. Contact your doctor if the problem does not go away or is severe. This medicine may cause dry eyes and blurred vision. If you wear contact lenses you may feel some discomfort. Lubricating drops may help. See your eye doctor if the problem does not go away or is severe. This medicine can make you more sensitive to the sun. Keep out of the sun. If you cannot avoid being in the sun, wear protective clothing and use sunscreen. Do not use sun lamps or tanning beds/booths. If you are diabetic, check your blood-sugar levels regularly. What side effects may I notice from receiving this medicine? Side effects that you should report to your doctor or health care professional as soon as possible: -blurred vision -irregular heartbeat, palpitations or chest pain -muscle or facial twitches -pain or difficulty passing urine -seizures -skin rash -slowed or shallow breathing -unusual bleeding or bruising -yellowing of the eyes or skin Side effects that usually do not require medical attention (report to your doctor or health care professional if they continue or are bothersome): -headache -nightmares, agitation, nervousness, excitability, not able to sleep (these are more likely in  children) -stuffy nose This list may not describe all possible side effects. Call your doctor for medical advice about side effects. You may report side effects to FDA at 1-800-FDA-1088. Where should I keep my medicine? Keep out of the reach of children. Store at room temperature, between 20 and 25 degrees C (68 and 77 degrees F). Protect from light. Throw away any unused medicine after the expiration date. NOTE: This sheet is a summary. It may not cover all possible information. If you have questions about this medicine, talk to your doctor, pharmacist, or health care provider.  2017 Elsevier/Gold Standard (2013-06-27 15:04:46)   Ceftriaxone (Injection/Shot) Also known as:  Rocephin  Ceftriaxone injection What is this medicine? CEFTRIAXONE (sef try AX one) is a cephalosporin antibiotic. It is used to treat certain kinds of bacterial infections. It will not work for colds, flu, or other viral infections. This medicine may be used for other purposes; ask your health care provider or pharmacist if you have  questions. COMMON BRAND NAME(S): Rocephin What should I tell my health care provider before I take this medicine? They need to know if you have any of these conditions: -any chronic illness -bowel disease, like colitis -both kidney and liver disease -high bilirubin level in newborn patients -an unusual or allergic reaction to ceftriaxone, other cephalosporin or penicillin antibiotics, foods, dyes, or preservatives -pregnant or trying to get pregnant -breast-feeding How should I use this medicine? This medicine is injected into a muscle or infused it into a vein. It is usually given in a medical office or clinic. If you are to give this medicine you will be taught how to inject it. Follow instructions carefully. Use your doses at regular intervals. Do not take your medicine more often than directed. Do not skip doses or stop your medicine early even if you feel better. Do not stop taking  except on your doctor's advice. Talk to your pediatrician regarding the use of this medicine in children. Special care may be needed. Overdosage: If you think you have taken too much of this medicine contact a poison control center or emergency room at once. NOTE: This medicine is only for you. Do not share this medicine with others. What if I miss a dose? If you miss a dose, take it as soon as you can. If it is almost time for your next dose, take only that dose. Do not take double or extra doses. What may interact with this medicine? Do not take this medicine with any of the following medications: -intravenous calcium This medicine may also interact with the following medications: -birth control pills This list may not describe all possible interactions. Give your health care provider a list of all the medicines, herbs, non-prescription drugs, or dietary supplements you use. Also tell them if you smoke, drink alcohol, or use illegal drugs. Some items may interact with your medicine. What should I watch for while using this medicine? Tell your doctor or health care professional if your symptoms do not improve or if they get worse. Do not treat diarrhea with over the counter products. Contact your doctor if you have diarrhea that lasts more than 2 days or if it is severe and watery. If you are being treated for a sexually transmitted disease, avoid sexual contact until you have finished your treatment. Having sex can infect your sexual partner. Calcium may bind to this medicine and cause lung or kidney problems. Avoid calcium products while taking this medicine and for 48 hours after taking the last dose of this medicine. What side effects may I notice from receiving this medicine? Side effects that you should report to your doctor or health care professional as soon as possible: -allergic reactions like skin rash, itching or hives, swelling of the face, lips, or tongue -breathing  problems -fever, chills -irregular heartbeat -pain when passing urine -seizures -stomach pain, cramps -unusual bleeding, bruising -unusually weak or tired Side effects that usually do not require medical attention (report to your doctor or health care professional if they continue or are bothersome): -diarrhea -dizzy, drowsy -headache -nausea, vomiting -pain, swelling, irritation where injected -stomach upset -sweating This list may not describe all possible side effects. Call your doctor for medical advice about side effects. You may report side effects to FDA at 1-800-FDA-1088. Where should I keep my medicine? Keep out of the reach of children. Store at room temperature below 25 degrees C (77 degrees F). Protect from light. Throw away any unused vials after the expiration  date. NOTE: This sheet is a summary. It may not cover all possible information. If you have questions about this medicine, talk to your doctor, pharmacist, or health care provider.  2017 Elsevier/Gold Standard (2014-05-14 09:14:54)     Cobicistat; Elvitegravir; Emtricitabine; Tenofovir Alafenamide oral tablets   What is this medicine? COBICISTAT; ELVITEGRAVIR; EMTRICITABINE; TENOFOVIR ALAFENAMIDE (koe BIS i stat; el vye TEG ra veer; em tri SIT uh bean; te NOE fo veer) is three antiretroviral medicines and a medication booster in one tablet. It is used to treat HIV. This medicine is not a cure for HIV. It will not stop the spread of HIV to others. This medicine may be used for other purposes; ask your health care provider or pharmacist if you have questions. COMMON BRAND NAME(S): Genvoya  What should I tell my health care provider before I take this medicine? They need to know if you have any of these conditions: -kidney disease -liver disease -an unusual or allergic reaction to cobicistat, elvitegravir, emtricitabine, tenofovir, other medicines, foods, dyes, or preservatives -pregnant or trying to get  pregnant -breast-feeding  How should I use this medicine? Take this medicine by mouth with a glass of water. Follow the directions on the prescription label. Take this medicine with food. Take your medicine at regular intervals. Do not take your medicine more often than directed. For your anti-HIV therapy to work as well as possible, take each dose exactly as prescribed. Do not skip doses or stop your medicine even if you feel better. Skipping doses may make the HIV virus resistant to this medicine and other medicines. Do not stop taking except on your doctor's advice. Talk to your pediatrician regarding the use of this medicine in children. While this drug may be prescribed for selected conditions, precautions do apply. Overdosage: If you think you have taken too much of this medicine contact a poison control center or emergency room at once. NOTE: This medicine is only for you. Do not share this medicine with others.  What if I miss a dose? If you miss a dose, take it as soon as you can. If it is almost time for your next dose, take only that dose. Do not take double or extra doses.  What may interact with this medicine? Do not take this medicine with any of the following medications: -adefovir -alfuzosin -certain medicines for seizures like carbamazepine, phenobarbital, phenytoin -cisapride -lumacaftor; ivacaftor -lurasidone -medicines for cholesterol like lovastatin, simvastatin -medicines for headaches like dihydroergotamine, ergotamine, methylergonovine -midazolam -other antiviral medicines for HIV or AIDS -pimozide -rifampin -sildenafil -St. John's wort -triazolam This medicine may also interact with the following medications: -antacids -atorvastatin -bosentan -buprenorphine; naloxone -certain antibiotics like clarithromycin, telithromycin, rifabutin, rifapentine -certain medications for anxiety or sleep like buspirone, clorazepate, diazepam, estazolam, flurazepam,  zolpidem -certain medicines for blood pressure or heart disease like amlodipine, diltiazem, felodipine, metoprolol, nicardipine, nifedipine, timolol, verapamil -certain medicines for depression, anxiety, or psychiatric disturbances -certain medicines for erectile dysfunction like avanafil, sildenafil, tadalafil, vardenafil -certain medicines for fungal infection like itraconazole, ketoconazole, voriconazole -colchicine -cyclosporine -dexamethasone -female hormones, like estrogens and progestins and birth control pills -fluticasone -medicines for infection like acyclovir, cidofovir, valacyclovir, ganciclovir, valganciclovir -medicines for irregular heart beat like amiodarone, bepridil, digoxin, disopyramide, dofetilide, flecainide, lidocaine, mexiletine, propafenone, quinidine -metformin -oxcarbazepine -phenothiazines like perphenazine, risperidone, thioridazine -salmeterol -sirolimus -tacrolimus -warfarin This list may not describe all possible interactions. Give your health care provider a list of all the medicines, herbs, non-prescription drugs, or dietary supplements you use. Also tell  them if you smoke, drink alcohol, or use illegal drugs. Some items may interact with your medicine.  What should I watch for while using this medicine? Visit your doctor or health care professional for regular check ups. Discuss any new symptoms with your doctor. You will need to have important blood work done while on this medicine. HIV is spread to others through sexual or blood contact. Talk to your doctor about how to stop the spread of HIV. If you have hepatitis B, talk to your doctor if you plan to stop this medicine. The symptoms of hepatitis B may get worse if you stop this medicine. Birth control pills may not work properly while you are taking this medicine. Talk to your doctor about using an extra method of birth control. Women who can still have children must use a reliable form of barrier  contraception, like a condom.  What side effects may I notice from receiving this medicine? Side effects that you should report to your doctor or health care professional as soon as possible: -allergic reactions like skin rash, itching or hives, swelling of the face, lips, or tongue -breathing problems -fast, irregular heartbeat -muscle pain or weakness -signs and symptoms of kidney injury like trouble passing urine or change in the amount of urine -signs and symptoms of liver injury like dark yellow or brown urine; general ill feeling or flu-like symptoms; light-colored stools; loss of appetite; right upper belly pain; unusually weak or tired; yellowing of the eyes or skin Side effects that usually do not require medical attention (report to your doctor or health care professional if they continue or are bothersome): -diarrhea -headache -nausea -tiredness This list may not describe all possible side effects. Call your doctor for medical advice about side effects. You may report side effects to FDA at 1-800-FDA-1088.  Where should I keep my medicine? Keep out of the reach of children. Store at room temperature below 30 degrees C (86 degrees F). Throw away any unused medicine after the expiration date. NOTE: This sheet is a summary. It may not cover all possible information. If you have questions about this medicine, talk to your doctor, pharmacist, or health care provider.  2018 Elsevier/Gold Standard (2016-08-10 12:54:04)
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
## 2017-04-28 NOTE — ED Notes (Signed)
No change in patient condition. Pt continues to rest in bed, VSS and WNL. Respirations even and unlabored. Will continue to monitor for further patient needs.
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
## 2017-04-28 NOTE — ED Notes (Signed)
Lights turned on to wake patient up. No change in patient condition, resting in bed, respirations even and unlabored. Skin warm, dry, and intact.

## 2017-04-28 NOTE — ED Notes (Signed)
Pt resting in bed, playing on phone. Denies any needs at this time. Amy, SANE nurse at bedside at this time. Will continue to monitor for further patient needs.
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
## 2017-04-28 NOTE — ED Notes (Signed)
Pt sitting up in bed eating peanut butter and crackers, noted to be more alert and oriented at this time, states SANE nurse is coming to see her. Will continue to monitor for further patient needs at this time.
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
## 2017-04-28 NOTE — ED Provider Notes (Signed)
The patient is pending discharge, however the SANE nurse just indicated the patient has a yeast infection and is requesting Diflucan.   Shannon Patel, Shannon Seiple, MD 04/28/17 1642
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
## 2017-04-28 NOTE — ED Notes (Signed)
No change in patient condition. Pt resting in bed with eyes closed. Respirations even and unlabored. Awaiting lab results prior to being seen by SANE nurse, CSW was previously in room and saw patient. Will continue to monitor for further patient needs.

## 2017-04-28 NOTE — ED Notes (Signed)
This RN to bedside, pt is noted to awaken somewhat more easily, pupils noted to be sluggish but reactive. Will continue to monitor for further patient needs. Pt ambulated with Marisue IvanLiz, RN with no difficulty.
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
## 2017-04-29 LAB — HEPATITIS C ANTIBODY

## 2017-04-29 LAB — RPR: RPR Ser Ql: NONREACTIVE

## 2017-04-29 LAB — HEPATITIS B SURFACE ANTIGEN: Hepatitis B Surface Ag: NEGATIVE

## 2017-04-29 NOTE — Consult Note (Signed)
During speculum exam, patient noted to have thick, white, curd-like discharge. She is complaining of vaginal burning and mild itching. Loraine GripMegan Jones, RN, notified of findings, via phone, and asked to convey findings to physician for treatment order. Physician unavailable, via phone, at the time. Megan encouraged to have him call me if he has any questions.

## 2017-04-29 NOTE — SANE Note (Signed)
Called to [facility] for SANE consult. After thorough explanation of the [facility] kit process and STI prophylaxis, patient states she would like to proceed with kit collection and all medications, including HIV nPEP. Patient reports that she is homeless and does not have an address to provide for Genvoya to be mailed to her.  [facility] notified of need for 28 doses of Genvoya. They requested written prescription. Prescription obtained from Dr. Roderic Palau and sent to [place].  Appropriate labs ordered.

## 2017-04-29 NOTE — Consult Note (Signed)
Talked with Elnita Maxwellheryl, RN Case Manager and Morrie SheldonAshley, LCSW about potential placement for the patient. Morrie Sheldonshley is trying to locate placement and will advise patient and ED nurse of outcome.

## 2017-05-03 LAB — POCT PREGNANCY, URINE: PREG TEST UR: NEGATIVE

## 2017-12-08 ENCOUNTER — Ambulatory Visit: Payer: Self-pay

## 2018-03-07 DIAGNOSIS — Z91410 Personal history of adult physical and sexual abuse: Secondary | ICD-10-CM | POA: Insufficient documentation

## 2018-03-07 DIAGNOSIS — Z653 Problems related to other legal circumstances: Secondary | ICD-10-CM | POA: Insufficient documentation

## 2018-03-07 DIAGNOSIS — E663 Overweight: Secondary | ICD-10-CM | POA: Insufficient documentation

## 2018-03-07 LAB — HM HIV SCREENING LAB: HM HIV Screening: NEGATIVE

## 2018-03-07 LAB — HM PAP SMEAR

## 2018-05-31 ENCOUNTER — Encounter: Payer: Self-pay | Admitting: Emergency Medicine

## 2018-05-31 ENCOUNTER — Other Ambulatory Visit: Payer: Self-pay

## 2018-05-31 ENCOUNTER — Emergency Department
Admission: EM | Admit: 2018-05-31 | Discharge: 2018-05-31 | Disposition: A | Discharge status: Home / Self Care | Payer: Self-pay | Attending: Emergency Medicine | Admitting: Emergency Medicine

## 2018-05-31 DIAGNOSIS — Z79899 Other long term (current) drug therapy: Secondary | ICD-10-CM | POA: Insufficient documentation

## 2018-05-31 DIAGNOSIS — E876 Hypokalemia: Secondary | ICD-10-CM | POA: Insufficient documentation

## 2018-05-31 DIAGNOSIS — F1910 Other psychoactive substance abuse, uncomplicated: Secondary | ICD-10-CM | POA: Insufficient documentation

## 2018-05-31 DIAGNOSIS — F17210 Nicotine dependence, cigarettes, uncomplicated: Secondary | ICD-10-CM | POA: Insufficient documentation

## 2018-05-31 LAB — URINE DRUG SCREEN, QUALITATIVE (ARMC ONLY)
Amphetamines, Ur Screen: POSITIVE — AB
Barbiturates, Ur Screen: NOT DETECTED
Benzodiazepine, Ur Scrn: NOT DETECTED
COCAINE METABOLITE, UR ~~LOC~~: NOT DETECTED
Cannabinoid 50 Ng, Ur ~~LOC~~: NOT DETECTED
MDMA (Ecstasy)Ur Screen: NOT DETECTED
METHADONE SCREEN, URINE: NOT DETECTED
Opiate, Ur Screen: NOT DETECTED
Phencyclidine (PCP) Ur S: NOT DETECTED
Tricyclic, Ur Screen: NOT DETECTED

## 2018-05-31 LAB — CBC
HEMATOCRIT: 39.6 % (ref 35.0–47.0)
HEMOGLOBIN: 13.7 g/dL (ref 12.0–16.0)
MCH: 30.3 pg (ref 26.0–34.0)
MCHC: 34.6 g/dL (ref 32.0–36.0)
MCV: 87.7 fL (ref 80.0–100.0)
Platelets: 338 10*3/uL (ref 150–440)
RBC: 4.52 MIL/uL (ref 3.80–5.20)
RDW: 12.6 % (ref 11.5–14.5)
WBC: 5.8 10*3/uL (ref 3.6–11.0)

## 2018-05-31 LAB — COMPREHENSIVE METABOLIC PANEL
ALK PHOS: 56 U/L (ref 38–126)
ALT: 9 U/L (ref 0–44)
ANION GAP: 7 (ref 5–15)
AST: 19 U/L (ref 15–41)
Albumin: 4.2 g/dL (ref 3.5–5.0)
BUN: 7 mg/dL (ref 6–20)
CALCIUM: 9.2 mg/dL (ref 8.9–10.3)
CO2: 29 mmol/L (ref 22–32)
Chloride: 105 mmol/L (ref 98–111)
Creatinine, Ser: 0.6 mg/dL (ref 0.44–1.00)
GFR calc Af Amer: >60 mL/min (ref >60)
GFR calc non Af Amer: >60 mL/min (ref >60)
GLUCOSE: 120 mg/dL — AB (ref 70–99)
POTASSIUM: 2.7 mmol/L — AB (ref 3.5–5.1)
SODIUM: 141 mmol/L (ref 135–145)
Total Bilirubin: 0.5 mg/dL (ref 0.3–1.2)
Total Protein: 7.5 g/dL (ref 6.5–8.1)

## 2018-05-31 LAB — POCT PREGNANCY, URINE: Preg Test, Ur: NEGATIVE

## 2018-05-31 LAB — ETHANOL: Alcohol, Ethyl (B): <10 mg/dL (ref <10)

## 2018-05-31 LAB — ACETAMINOPHEN LEVEL

## 2018-05-31 LAB — SALICYLATE LEVEL: Salicylate Lvl: <7 mg/dL (ref 2.8–30.0)

## 2018-05-31 MED ORDER — POTASSIUM CHLORIDE CRYS ER 20 MEQ PO TBCR
40.0000 meq | EXTENDED_RELEASE_TABLET | Freq: Once | ORAL | Status: AC
  Administered 2018-05-31: 40 meq via ORAL
  Filled 2018-05-31: qty 2

## 2018-05-31 NOTE — ED Notes (Signed)

## 2018-05-31 NOTE — ED Notes (Signed)
Pt given lunch tray.

## 2018-05-31 NOTE — ED Notes (Signed)
Belongings placed in personal belongings bag:  Black shoes Pink leggings white shirt White stone earrings Black and brown hair bow Set of keys with 2 cards Pink shirt Black bra naval ring with clear stone.

## 2018-05-31 NOTE — Discharge Instructions (Addendum)
Please seek medical attention and help for any thoughts about wanting to harm yourself, harm others, any concerning change in behavior, severe depression, inappropriate drug use or any other new or concerning symptoms. ° °

## 2018-05-31 NOTE — ED Provider Notes (Signed)
[facility] Emergency Department Provider Note  ____________________________________________   I have reviewed the triage vital signs and the nursing notes.   HISTORY  Chief Complaint Mental Health Problem   History limited by: Not Limited   HPI Shannon Patel is a 37 y.o. female who presents to the emergency department today because of concerns for mental breakdown.  The patient states that she has been under a lot of stress recently.  She denies any SI or HI but states that she has been hearing voices.  She states that these voices have been present since Christmas time.  She denies having seen anyone for this in the past.  Denies being put on any medication for mental health issues.  Denies any medical complaints.  States that she has recently used meth.   Per medical record review patient has a history of bipolar disorder  Past Medical History:  Diagnosis Date  . Bipolar 1 disorder [facility])     Patient Active Problem List   Diagnosis Date Noted  . 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 Alcohol use disorder, moderate, in sustained remission (HCC) 12/14/2016  . unspecified schizophrenia spectrum  disorder 12/14/2016  . Tobacco use disorder 07/30/2016    History reviewed. No pertinent surgical history.  Prior to Admission medications   Medication Sig Start Date End Date Taking? Authorizing Provider  albuterol (PROVENTIL HFA;VENTOLIN HFA) 108 (90 Base) MCG/ACT inhaler Inhale 2 puffs into the lungs every 6 (six) hours as needed. 10/23/15   [provider]  famotidine (PEPCID) 20 MG tablet Take 20 mg by mouth daily. 10/23/15   [provider]  risperiDONE (RISPERDAL) 1 MG tablet Take 1 tablet (1 mg total) by mouth 2 (two) times daily. 12/16/16   Jimmy Footman, MD  traZODone (DESYREL) 100 MG tablet Take 1 tablet (100 mg total) by mouth at bedtime. 12/16/16   Jimmy Footman, MD    Allergies Patient  has no known allergies.  No family history on file.  Social History Social History   Tobacco Use  . Smoking status: Current Every Day Smoker    Packs/day: 0.50    Years: 10.00    Pack years: 5.00    Types: Cigarettes  . Smokeless tobacco: Current User  Substance Use Topics  . Alcohol use: Yes    Alcohol/week: 3.0 oz    Types: 5 Glasses of wine per week    Comment: No use in 30 days  . Drug use: Yes    Types: Amphetamines    Comment: Snorted drugs 3 days ago unsure of what it was    Review of Systems Constitutional: No fever/chills Eyes: No visual changes. ENT: No sore throat. Cardiovascular: Denies chest pain. Respiratory: Denies shortness of breath. Gastrointestinal: No abdominal pain.  No nausea, no vomiting.  No diarrhea.   Genitourinary: Negative for dysuria. Musculoskeletal: Negative for back pain. Skin: Negative for rash. Neurological: Negative for headaches, focal weakness or numbness.  ____________________________________________   PHYSICAL EXAM:  VITAL SIGNS: ED Triage Vitals  Enc Vitals Group     BP --      Pulse --      Resp --      Temp --      Temp src --      SpO2 --      Weight 05/31/18 0820 135 lb (61.2 kg)     Height 05/31/18 0820 5\' 3"  (1.6 m)     Head Circumference --      Peak  Flow --    Constitutional: Alert and oriented.  Eyes: Conjunctivae are normal.  ENT      Head: Normocephalic and atraumatic.      Nose: No congestion/rhinnorhea.      Mouth/Throat: Mucous membranes are moist.      Neck: No stridor. Hematological/Lymphatic/Immunilogical: No cervical lymphadenopathy. Cardiovascular: Normal rate, regular rhythm.  No murmurs, rubs, or gallops. Respiratory: Normal respiratory effort without tachypnea nor retractions. Breath sounds are clear and equal bilaterally. No wheezes/rales/rhonchi. Gastrointestinal: Soft and non tender. No rebound. No guarding.  Genitourinary: Deferred Musculoskeletal: Normal range of motion in all  extremities. No lower extremity edema. Neurologic:  Normal speech and language. No gross focal neurologic deficits are appreciated.  Skin:  Skin is warm, dry and intact. No rash noted. Psychiatric: Endorses hallucinations. Does not appear to be responding to internal stimuli.  ____________________________________________    LABS (pertinent positives/negatives)  CMP na 141, k 2.7, glu 120, cr 0.60 Ethanol, salicylate, acetaminophen below threshold CBC wbc 5.8, hgb 13.7, plt 338 Upreg negative UDS positive amphetamine  ____________________________________________   EKG  None  ____________________________________________    RADIOLOGY  None  ____________________________________________   PROCEDURES  Procedures  ____________________________________________   INITIAL IMPRESSION / ASSESSMENT AND PLAN / ED COURSE  Pertinent labs & imaging results that were available during my care of the patient were reviewed by me and considered in my medical decision making (see chart for details).   Presents to the emergency department today because of concerns for nervous breakdown.  Patient's work-up did show mild hypokalemia and she was given potassium here.  Did offer to have psychiatry evaluate the patient however she chose to leave prior to psych evaluation.  I do think this is reasonable.  She is neither SI or HI.  Will give [facility] and [facility] follow-up information.  ____________________________________________   FINAL CLINICAL IMPRESSION(S) / ED DIAGNOSES  Final diagnoses:  Substance abuse (HCC)     Note: This dictation was prepared with Dragon dictation. Any transcriptional errors that result from this process are unintentional     Phineas SemenGoodman, Valbona Slabach, MD 05/31/18 1517

## 2018-05-31 NOTE — ED Notes (Signed)
Patient refuses blood draw in triage 

## 2018-05-31 NOTE — ED Notes (Signed)
BEHAVIORAL HEALTH ROUNDING Patient sleeping: Yes.   Patient alert and oriented: eyes closed  Appears to be asleep Behavior appropriate: Yes.  ; If no, describe:  Nutrition and fluids offered: Yes  Toileting and hygiene offered: sleeping Sitter present: q 15 minute observations and security monitoring Law enforcement present: yes   

## 2018-05-31 NOTE — ED Notes (Signed)
Patient refused blood work and v-signs at this time will ask again notified nurse Rhea .

## 2018-05-31 NOTE — ED Triage Notes (Signed)
Arrives stating "I am going through a mental break down".  States has multiple personal stressor "for a while".  Denies SI/ HI.  States "I am overwhelmed".

## 2018-12-15 ENCOUNTER — Encounter: Payer: Self-pay | Admitting: Emergency Medicine

## 2018-12-15 ENCOUNTER — Emergency Department: Payer: Self-pay

## 2018-12-15 ENCOUNTER — Emergency Department
Admission: EM | Admit: 2018-12-15 | Discharge: 2018-12-15 | Disposition: A | Discharge status: Home / Self Care | Payer: Self-pay | Attending: Emergency Medicine | Admitting: Emergency Medicine

## 2018-12-15 ENCOUNTER — Other Ambulatory Visit: Payer: Self-pay

## 2018-12-15 DIAGNOSIS — Y9389 Activity, other specified: Secondary | ICD-10-CM | POA: Insufficient documentation

## 2018-12-15 DIAGNOSIS — W231XXA Caught, crushed, jammed, or pinched between stationary objects, initial encounter: Secondary | ICD-10-CM | POA: Insufficient documentation

## 2018-12-15 DIAGNOSIS — Y92008 Other place in unspecified non-institutional (private) residence as the place of occurrence of the external cause: Secondary | ICD-10-CM | POA: Insufficient documentation

## 2018-12-15 DIAGNOSIS — Z79899 Other long term (current) drug therapy: Secondary | ICD-10-CM | POA: Insufficient documentation

## 2018-12-15 DIAGNOSIS — F17210 Nicotine dependence, cigarettes, uncomplicated: Secondary | ICD-10-CM | POA: Insufficient documentation

## 2018-12-15 DIAGNOSIS — S62606A Fracture of unspecified phalanx of right little finger, initial encounter for closed fracture: Secondary | ICD-10-CM | POA: Insufficient documentation

## 2018-12-15 DIAGNOSIS — Y999 Unspecified external cause status: Secondary | ICD-10-CM | POA: Insufficient documentation

## 2018-12-15 MED ORDER — HYDROCODONE-ACETAMINOPHEN 5-325 MG PO TABS
1.0000 | ORAL_TABLET | Freq: Once | ORAL | Status: AC
  Administered 2018-12-15: 1 via ORAL
  Filled 2018-12-15: qty 1

## 2018-12-15 MED ORDER — HYDROCODONE-ACETAMINOPHEN 5-325 MG PO TABS: 1.0000 | ORAL_TABLET | Freq: Four times a day (QID) | ORAL | 0 refills | Status: DC | PRN

## 2018-12-15 NOTE — ED Provider Notes (Signed)
[facility] Emergency Department Provider Note  ____________________________________________   First MD Initiated Contact with Patient 12/15/18 2135     (approximate)  I have reviewed the triage vital signs and the nursing notes.   HISTORY  Chief Complaint Finger Injury    HPI Shannon Patel is a 38 y.o. female presents emergency department complaining of right fifth finger pain.  States she jammed her finger between her suitcase and the wall trying to get the suitcase out of a closet last night.  She states that is been very painful.  She denies any numbness tingling or other injuries.    Past Medical History:  Diagnosis Date  . Bipolar 1 disorder [facility])     Patient Active Problem List   Diagnosis Date Noted  . 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 Alcohol use disorder, moderate, in sustained remission (HCC) 12/14/2016  . unspecified schizophrenia spectrum  disorder 12/14/2016  . Tobacco use disorder 07/30/2016    History reviewed. No pertinent surgical history.  Prior to Admission medications   Medication Sig Start Date End Date Taking? Authorizing Provider  albuterol (PROVENTIL HFA;VENTOLIN HFA) 108 (90 Base) MCG/ACT inhaler Inhale 2 puffs into the lungs every 6 (six) hours as needed. 10/23/15   [provider]  famotidine (PEPCID) 20 MG tablet Take 20 mg by mouth daily. 10/23/15   [provider]  risperiDONE (RISPERDAL) 1 MG tablet Take 1 tablet (1 mg total) by mouth 2 (two) times daily. 12/16/16   Jimmy Footman, MD  traZODone (DESYREL) 100 MG tablet Take 1 tablet (100 mg total) by mouth at bedtime. 12/16/16   Jimmy Footman, MD    Allergies Patient has no known allergies.  History reviewed. No pertinent family history.  Social History Social History   Tobacco Use  . Smoking status: Current Every Day Smoker    Packs/day: 0.50    Years: 10.00    Pack years: 5.00    Types:  Cigarettes  . Smokeless tobacco: Current User  Substance Use Topics  . Alcohol use: Yes    Alcohol/week: 5.0 standard drinks    Types: 5 Glasses of wine per week    Comment: No use in 30 days  . Drug use: Yes    Types: Amphetamines    Comment: Snorted drugs 3 days ago unsure of what it was    Review of Systems  Constitutional: No fever/chills Eyes: No visual changes. ENT: No sore throat. Respiratory: Denies cough Genitourinary: Negative for dysuria. Musculoskeletal: Negative for back pain.  Positive for right fifth finger pain Skin: Negative for rash.    ____________________________________________   PHYSICAL EXAM:  VITAL SIGNS: ED Triage Vitals  Enc Vitals Group     BP 12/15/18 2044 107/63     Pulse Rate 12/15/18 2044 88     Resp 12/15/18 2044 16     Temp 12/15/18 2044 98.6 F (37 C)     Temp src --      SpO2 12/15/18 2044 100 %     Weight 12/15/18 2046 134 lb (60.8 kg)     Height 12/15/18 2046 5\' 3"  (1.6 m)     Head Circumference --      Peak Flow --      Pain Score 12/15/18 2046 7     Pain Loc --      Pain Edu? --      Excl. in GC? --     Constitutional: Alert and oriented. Well appearing and in no  acute distress. Eyes: Conjunctivae are normal.  Head: Atraumatic. Nose: No congestion/rhinnorhea. Mouth/Throat: Mucous membranes are moist.   Neck:  supple no lymphadenopathy noted Cardiovascular: Normal rate, regular rhythm. Respiratory: Normal respiratory effort.  No retractions, GU: deferred Musculoskeletal: FROM all extremities, warm and well perfused, right fifth finger is bruised and swollen tender at the distal aspect.  Neurovascular is intact Neurologic:  Normal speech and language.  Skin:  Skin is warm, dry and intact. No rash noted. Psychiatric: Mood and affect are normal. Speech and behavior are normal.  ____________________________________________   LABS (all labs ordered are listed, but only abnormal results are displayed)  Labs Reviewed -  No data to display ____________________________________________   ____________________________________________  RADIOLOGY  X-ray of the right fifth finger shows a fracture at the distal phalanx  ____________________________________________   PROCEDURES  Procedure(s) performed: Finger splint was applied by the tech   Procedures    ____________________________________________   INITIAL IMPRESSION / ASSESSMENT AND PLAN / ED COURSE  Pertinent labs & imaging results that were available during my care of the patient were reviewed by me and considered in my medical decision making (see chart for details).   Patient is 38 year old female presents emergency department complaining of right fifth finger pain.  Physical exam shows some bruising and swelling with tenderness of the right fifth finger distal phalanx  X-ray of the right fifth finger is positive for fracture  The finger was placed in a finger splint.  Patient was encouraged to follow-up with orthopedics.  She states she understands and will comply.  She is given a Vicodin while here in the ED.  Prescription for Vicodin sent to the pharmacy of her choice.  She was discharged in stable condition     As part of my medical decision making, I reviewed the following data within the electronic MEDICAL RECORD NUMBER Nursing notes reviewed and incorporated, Old chart reviewed, Radiograph reviewed x-ray of the right fifth finger shows a distal phalanx fracture, Notes from prior ED visits and [place] Controlled Substance Database  ____________________________________________   FINAL CLINICAL IMPRESSION(S) / ED DIAGNOSES  Final diagnoses:  None      NEW MEDICATIONS STARTED DURING THIS VISIT:  New Prescriptions   No medications on file     Note:  This document was prepared using Dragon voice recognition software and may include unintentional dictation errors.    Faythe GheeFisher, Susan W, PA-C 12/15/18 2142    Dionne BucySiadecki, Sebastian,  MD 12/15/18 [phone]

## 2018-12-15 NOTE — ED Notes (Signed)
Patient discharged to home per MD order. Patient in stable condition, and deemed medically cleared by ED provider for discharge. Discharge instructions reviewed with patient/family using "Teach Back"; verbalized understanding of medication education and administration, and information about follow-up care. Denies further concerns. ° °

## 2018-12-15 NOTE — ED Triage Notes (Signed)
Finger injury right 5th digit last night

## 2018-12-15 NOTE — Discharge Instructions (Signed)
[facility]up with [facility].  Please call for an appointment.  Keep the finger splint on until evaluated by orthopedics.  Ice and elevate the finger to decrease pain and swelling.  Pain medication sparingly.  Narcotics can cause addiction problems.  Please use this medication sparingly.  He could also use Tylenol and ibuprofen.

## 2018-12-15 NOTE — ED Notes (Signed)
Pt was pulling suitcase last night and right 5th finger got crushed between suitcase and wall. Redness and bruising noted to area.

## 2018-12-18 ENCOUNTER — Emergency Department
Admission: EM | Admit: 2018-12-18 | Discharge: 2018-12-18 | Disposition: A | Discharge status: Home / Self Care | Payer: Medicaid Other | Attending: Emergency Medicine | Admitting: Emergency Medicine

## 2018-12-18 ENCOUNTER — Other Ambulatory Visit: Payer: Self-pay

## 2018-12-18 ENCOUNTER — Encounter: Payer: Self-pay | Admitting: Emergency Medicine

## 2018-12-18 DIAGNOSIS — Y929 Unspecified place or not applicable: Secondary | ICD-10-CM | POA: Insufficient documentation

## 2018-12-18 DIAGNOSIS — F17210 Nicotine dependence, cigarettes, uncomplicated: Secondary | ICD-10-CM | POA: Insufficient documentation

## 2018-12-18 DIAGNOSIS — Y999 Unspecified external cause status: Secondary | ICD-10-CM | POA: Insufficient documentation

## 2018-12-18 DIAGNOSIS — Z79899 Other long term (current) drug therapy: Secondary | ICD-10-CM | POA: Insufficient documentation

## 2018-12-18 DIAGNOSIS — S62636D Displaced fracture of distal phalanx of right little finger, subsequent encounter for fracture with routine healing: Secondary | ICD-10-CM

## 2018-12-18 DIAGNOSIS — S62636A Displaced fracture of distal phalanx of right little finger, initial encounter for closed fracture: Secondary | ICD-10-CM | POA: Insufficient documentation

## 2018-12-18 DIAGNOSIS — Y939 Activity, unspecified: Secondary | ICD-10-CM | POA: Insufficient documentation

## 2018-12-18 DIAGNOSIS — X58XXXA Exposure to other specified factors, initial encounter: Secondary | ICD-10-CM | POA: Insufficient documentation

## 2018-12-18 MED ORDER — KETOROLAC TROMETHAMINE 10 MG PO TABS: 10.0000 mg | ORAL_TABLET | Freq: Four times a day (QID) | ORAL | 0 refills | Status: DC | PRN

## 2018-12-18 MED ORDER — OXYCODONE-ACETAMINOPHEN 5-325 MG PO TABS
1.0000 | ORAL_TABLET | Freq: Once | ORAL | Status: AC
  Administered 2018-12-18: 1 via ORAL
  Filled 2018-12-18: qty 1

## 2018-12-18 MED ORDER — KETOROLAC TROMETHAMINE 30 MG/ML IJ SOLN
30.0000 mg | Freq: Once | INTRAMUSCULAR | Status: AC
  Administered 2018-12-18: 30 mg via INTRAMUSCULAR
  Filled 2018-12-18: qty 1

## 2018-12-18 NOTE — ED Notes (Signed)
Pt dx with fx finger 3 days ago. Here for pain control. States the vicodin is not working.

## 2018-12-18 NOTE — ED Provider Notes (Signed)
[facility] Emergency Department Provider Note  ____________________________________________  Time seen: Approximately 3:40 PM  I have reviewed the triage vital signs and the nursing notes.   HISTORY  Chief Complaint Hand Pain    HPI Shannon Patel is a 38 y.o. female that presents to emergency department for reevaluation of right little finger distal phalanx fracture for 3 days.  Patient states that she was diagnosed with a fracture in the emergency department 3 days ago.  She states that she has not been able to follow-up with orthopedics since.  She was given a prescription for Vicodin but has had to double up on the Vicodin due to increased pain.  She is also been taking Tylenol and Motrin.  She has not removed the finger splint that was placed in the emergency department.  No new injury.   Past Medical History:  Diagnosis Date  . Bipolar 1 disorder [facility])     Patient Active Problem List   Diagnosis Date Noted  . 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 Alcohol use disorder, moderate, in sustained remission (HCC) 12/14/2016  . unspecified schizophrenia spectrum  disorder 12/14/2016  . Tobacco use disorder 07/30/2016    No past surgical history on file.  Prior to Admission medications   Medication Sig Start Date End Date Taking? Authorizing Provider  albuterol (PROVENTIL HFA;VENTOLIN HFA) 108 (90 Base) MCG/ACT inhaler Inhale 2 puffs into the lungs every 6 (six) hours as needed. 10/23/15   [provider]  famotidine (PEPCID) 20 MG tablet Take 20 mg by mouth daily. 10/23/15   [provider]  HYDROcodone-acetaminophen (NORCO/VICODIN) 5-325 MG tablet Take 1 tablet by mouth every 6 (six) hours as needed for moderate pain. 12/15/18   Sherrie Mustache Roselyn Bering, PA-C  ketorolac (TORADOL) 10 MG tablet Take 1 tablet (10 mg total) by mouth every 6 (six) hours as needed. 12/18/18   Enid Derry, PA-C  risperiDONE (RISPERDAL) 1 MG tablet Take  1 tablet (1 mg total) by mouth 2 (two) times daily. 12/16/16   Jimmy Footman, MD  traZODone (DESYREL) 100 MG tablet Take 1 tablet (100 mg total) by mouth at bedtime. 12/16/16   Jimmy Footman, MD    Allergies Patient has no known allergies.  No family history on file.  Social History Social History   Tobacco Use  . Smoking status: Current Every Day Smoker    Packs/day: 0.50    Years: 10.00    Pack years: 5.00    Types: Cigarettes  . Smokeless tobacco: Current User  Substance Use Topics  . Alcohol use: Yes    Alcohol/week: 5.0 standard drinks    Types: 5 Glasses of wine per week    Comment: No use in 30 days  . Drug use: Yes    Types: Amphetamines    Comment: Snorted drugs 3 days ago unsure of what it was     Review of Systems  Respiratory: No SOB. Gastrointestinal: No nausea, no vomiting.  Musculoskeletal: Positive for finger pain. Skin: Negative for rash, abrasions, lacerations, ecchymosis. Neurological: Negative for numbness or tingling   ____________________________________________   PHYSICAL EXAM:  VITAL SIGNS: ED Triage Vitals  Enc Vitals Group     BP 12/18/18 1352 121/66     Pulse Rate 12/18/18 1352 83     Resp 12/18/18 1352 16     Temp 12/18/18 1352 98.3 F (36.8 C)     Temp Source 12/18/18 1352 Oral     SpO2 12/18/18 1352 99 %  Weight 12/18/18 1344 133 lb 15.9 oz (60.8 kg)     Height 12/18/18 1344 5\' 3"  (1.6 m)     Head Circumference --      Peak Flow --      Pain Score --      Pain Loc --      Pain Edu? --      Excl. in GC? --      Constitutional: Alert and oriented. Well appearing and in no acute distress. Eyes: Conjunctivae are normal. PERRL. EOMI. Head: Atraumatic. ENT:      Ears:      Nose: No congestion/rhinnorhea.      Mouth/Throat: Mucous membranes are moist.  Neck: No stridor.  Cardiovascular: Normal rate, regular rhythm.  Good peripheral circulation.  Cap refill less than 3 seconds. Respiratory: Normal  respiratory effort without tachypnea or retractions. Lungs CTAB. Good air entry to the bases with no decreased or absent breath sounds. Musculoskeletal: Full range of motion to all extremities. No gross deformities appreciated. Neurologic:  Normal speech and language. No gross focal neurologic deficits are appreciated.  Skin:  Skin is warm, dry and intact.  Erythema to distal right little finger. Psychiatric: Mood and affect are normal. Speech and behavior are normal. Patient exhibits appropriate insight and judgement.   ____________________________________________   LABS (all labs ordered are listed, but only abnormal results are displayed)  Labs Reviewed - No data to display ____________________________________________  EKG   ____________________________________________  RADIOLOGY  No results found.  ____________________________________________    PROCEDURES  Procedure(s) performed:    Procedures    Medications  ketorolac (TORADOL) 30 MG/ML injection 30 mg (has no administration in time range)  oxyCODONE-acetaminophen (PERCOCET/ROXICET) 5-325 MG per tablet 1 tablet (has no administration in time range)     ____________________________________________   INITIAL IMPRESSION / ASSESSMENT AND PLAN / ED COURSE  Pertinent labs & imaging results that were available during my care of the patient were reviewed by me and considered in my medical decision making (see chart for details).  Review of the [place] CSRS was performed in accordance of the [facility] prior to dispensing any controlled drugs.     Patient's diagnosis is consistent with distal phalanx fracture.  Vital signs and exam are reassuring.  Finger splint was removed and replaced in the emergency department.  She was given a dose of Percocet and Toradol in the emergency department for pain.  Patient will be discharged home with prescriptions for Toradol. Patient is to follow up with orthopedics as directed. Patient is  given ED precautions to return to the ED for any worsening or new symptoms.     ____________________________________________  FINAL CLINICAL IMPRESSION(S) / ED DIAGNOSES  Final diagnoses:  Displaced fracture of distal phalanx of right little finger, subsequent encounter for fracture with routine healing      NEW MEDICATIONS STARTED DURING THIS VISIT:  ED Discharge Orders         Ordered    ketorolac (TORADOL) 10 MG tablet  Every 6 hours PRN     12/18/18 1545              This chart was dictated using voice recognition software/Dragon. Despite best efforts to proofread, errors can occur which can change the meaning. Any change was purely unintentional.    Enid Derry, PA-C 12/18/18 Windell Hummingbird, MD 12/22/18 2218

## 2018-12-18 NOTE — ED Triage Notes (Signed)
Presents with pain to right 5 th finger  States she was seen 3 days ago for same and states she needs it rechecked

## 2019-08-15 DIAGNOSIS — Z653 Problems related to other legal circumstances: Secondary | ICD-10-CM

## 2019-08-15 DIAGNOSIS — Z91410 Personal history of adult physical and sexual abuse: Secondary | ICD-10-CM

## 2019-08-15 DIAGNOSIS — E663 Overweight: Secondary | ICD-10-CM

## 2019-08-15 DIAGNOSIS — Z62810 Personal history of physical and sexual abuse in childhood: Secondary | ICD-10-CM

## 2019-08-16 ENCOUNTER — Ambulatory Visit: Payer: Self-pay

## 2019-09-13 ENCOUNTER — Ambulatory Visit: Payer: Self-pay | Admitting: Advanced Practice Midwife

## 2019-09-13 ENCOUNTER — Other Ambulatory Visit: Payer: Self-pay

## 2019-09-13 DIAGNOSIS — F4310 Post-traumatic stress disorder, unspecified: Secondary | ICD-10-CM | POA: Insufficient documentation

## 2019-09-13 DIAGNOSIS — M797 Fibromyalgia: Secondary | ICD-10-CM

## 2019-09-13 DIAGNOSIS — F29 Unspecified psychosis not due to a substance or known physiological condition: Secondary | ICD-10-CM

## 2019-09-13 DIAGNOSIS — Z113 Encounter for screening for infections with a predominantly sexual mode of transmission: Secondary | ICD-10-CM

## 2019-09-13 DIAGNOSIS — F319 Bipolar disorder, unspecified: Secondary | ICD-10-CM

## 2019-09-13 LAB — WET PREP FOR TRICH, YEAST, CLUE
Trichomonas Exam: NEGATIVE
Yeast Exam: NEGATIVE

## 2019-09-13 MED ORDER — METRONIDAZOLE 500 MG PO TABS: 500.0000 mg | ORAL_TABLET | Freq: Two times a day (BID) | ORAL | 0 refills | Status: AC
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
## 2019-09-13 NOTE — Progress Notes (Signed)
Client counseled regarding recommendation for Tdap - vaccine declined. Per [facility], third Twinrix vaccine needed. 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 Condoms given. Rich Number, RN

## 2019-09-13 NOTE — Progress Notes (Signed)
Wet prep reveiwed-+BV treated per standing order Debera Lat, RN  Primary care list given Debera Lat, RN

## 2019-09-19 LAB — HM HEPATITIS C SCREENING LAB: HM Hepatitis Screen: NEGATIVE

## 2019-09-19 LAB — HM HIV SCREENING LAB: HM HIV Screening: NEGATIVE

## 2019-09-19 LAB — HEPATITIS B SURFACE ANTIGEN

## 2019-10-12 NOTE — Addendum Note (Signed)
Addended by: Donnal Moat on: 10/12/2019 11:31 AM   Modules accepted: Orders

## 2019-10-18 ENCOUNTER — Encounter: Payer: Self-pay | Admitting: Emergency Medicine

## 2019-10-18 ENCOUNTER — Other Ambulatory Visit: Payer: Self-pay

## 2019-10-18 ENCOUNTER — Emergency Department
Admission: EM | Admit: 2019-10-18 | Discharge: 2019-10-18 | Disposition: A | Discharge status: Home / Self Care | Payer: Medicaid Other | Attending: Student in an Organized Health Care Education/Training Program | Admitting: Student in an Organized Health Care Education/Training Program

## 2019-10-18 DIAGNOSIS — L03211 Cellulitis of face: Secondary | ICD-10-CM | POA: Insufficient documentation

## 2019-10-18 DIAGNOSIS — L03116 Cellulitis of left lower limb: Secondary | ICD-10-CM

## 2019-10-18 DIAGNOSIS — L03311 Cellulitis of abdominal wall: Secondary | ICD-10-CM

## 2019-10-18 DIAGNOSIS — F1510 Other stimulant abuse, uncomplicated: Secondary | ICD-10-CM | POA: Insufficient documentation

## 2019-10-18 DIAGNOSIS — F17210 Nicotine dependence, cigarettes, uncomplicated: Secondary | ICD-10-CM | POA: Insufficient documentation

## 2019-10-18 DIAGNOSIS — F17228 Nicotine dependence, chewing tobacco, with other nicotine-induced disorders: Secondary | ICD-10-CM | POA: Insufficient documentation

## 2019-10-18 MED ORDER — SULFAMETHOXAZOLE-TRIMETHOPRIM 800-160 MG PO TABS: 1.0000 | ORAL_TABLET | Freq: Two times a day (BID) | ORAL | 0 refills | Status: DC

## 2019-10-18 MED ORDER — SULFAMETHOXAZOLE-TRIMETHOPRIM 800-160 MG PO TABS
1.0000 | ORAL_TABLET | Freq: Once | ORAL | Status: AC
  Administered 2019-10-18: 1 via ORAL
  Filled 2019-10-18: qty 1

## 2019-10-18 NOTE — ED Triage Notes (Signed)
Pt reports has a stye or abscess by her right eye and it is painful.
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
## 2019-10-25 ENCOUNTER — Emergency Department: Payer: Self-pay

## 2019-10-25 ENCOUNTER — Encounter: Payer: Self-pay | Admitting: Intensive Care

## 2019-10-25 ENCOUNTER — Other Ambulatory Visit: Payer: Self-pay

## 2019-10-25 ENCOUNTER — Inpatient Hospital Stay
Admission: EM | Admit: 2019-10-25 | Discharge: 2019-10-26 | DRG: 866 | Disposition: A | Discharge status: Home / Self Care | Payer: Self-pay | Attending: Family Medicine | Admitting: Family Medicine

## 2019-10-25 DIAGNOSIS — Y92009 Unspecified place in unspecified non-institutional (private) residence as the place of occurrence of the external cause: Secondary | ICD-10-CM

## 2019-10-25 DIAGNOSIS — F319 Bipolar disorder, unspecified: Secondary | ICD-10-CM | POA: Diagnosis present

## 2019-10-25 DIAGNOSIS — M25552 Pain in left hip: Secondary | ICD-10-CM | POA: Diagnosis present

## 2019-10-25 DIAGNOSIS — M545 Low back pain: Secondary | ICD-10-CM | POA: Diagnosis present

## 2019-10-25 DIAGNOSIS — R7401 Elevation of levels of liver transaminase levels: Secondary | ICD-10-CM | POA: Diagnosis present

## 2019-10-25 DIAGNOSIS — L271 Localized skin eruption due to drugs and medicaments taken internally: Secondary | ICD-10-CM | POA: Diagnosis present

## 2019-10-25 DIAGNOSIS — I959 Hypotension, unspecified: Secondary | ICD-10-CM | POA: Diagnosis present

## 2019-10-25 DIAGNOSIS — L0100 Impetigo, unspecified: Secondary | ICD-10-CM | POA: Diagnosis present

## 2019-10-25 DIAGNOSIS — T368X5A Adverse effect of other systemic antibiotics, initial encounter: Secondary | ICD-10-CM | POA: Diagnosis present

## 2019-10-25 DIAGNOSIS — F17210 Nicotine dependence, cigarettes, uncomplicated: Secondary | ICD-10-CM | POA: Diagnosis present

## 2019-10-25 DIAGNOSIS — R739 Hyperglycemia, unspecified: Secondary | ICD-10-CM | POA: Diagnosis present

## 2019-10-25 DIAGNOSIS — A419 Sepsis, unspecified organism: Secondary | ICD-10-CM

## 2019-10-25 DIAGNOSIS — Z8249 Family history of ischemic heart disease and other diseases of the circulatory system: Secondary | ICD-10-CM

## 2019-10-25 DIAGNOSIS — B349 Viral infection, unspecified: Principal | ICD-10-CM | POA: Diagnosis present

## 2019-10-25 DIAGNOSIS — D72810 Lymphocytopenia: Secondary | ICD-10-CM

## 2019-10-25 DIAGNOSIS — Z716 Tobacco abuse counseling: Secondary | ICD-10-CM

## 2019-10-25 DIAGNOSIS — R509 Fever, unspecified: Secondary | ICD-10-CM

## 2019-10-25 DIAGNOSIS — D649 Anemia, unspecified: Secondary | ICD-10-CM | POA: Diagnosis present

## 2019-10-25 DIAGNOSIS — R21 Rash and other nonspecific skin eruption: Secondary | ICD-10-CM

## 2019-10-25 DIAGNOSIS — E871 Hypo-osmolality and hyponatremia: Secondary | ICD-10-CM | POA: Diagnosis present

## 2019-10-25 DIAGNOSIS — Z20828 Contact with and (suspected) exposure to other viral communicable diseases: Secondary | ICD-10-CM | POA: Diagnosis present

## 2019-10-25 LAB — URINE DRUG SCREEN, QUALITATIVE (ARMC ONLY)
Amphetamines, Ur Screen: NOT DETECTED
Barbiturates, Ur Screen: NOT DETECTED
Benzodiazepine, Ur Scrn: NOT DETECTED
Cannabinoid 50 Ng, Ur ~~LOC~~: NOT DETECTED
Cocaine Metabolite,Ur ~~LOC~~: NOT DETECTED
MDMA (Ecstasy)Ur Screen: NOT DETECTED
Methadone Scn, Ur: NOT DETECTED
Opiate, Ur Screen: NOT DETECTED
Phencyclidine (PCP) Ur S: NOT DETECTED
Tricyclic, Ur Screen: NOT DETECTED

## 2019-10-25 LAB — COMPREHENSIVE METABOLIC PANEL
ALT: 52 U/L — ABNORMAL HIGH (ref 0–44)
AST: 84 U/L — ABNORMAL HIGH (ref 15–41)
Albumin: 3.6 g/dL (ref 3.5–5.0)
Alkaline Phosphatase: 53 U/L (ref 38–126)
Anion gap: 11 (ref 5–15)
BUN: 7 mg/dL (ref 6–20)
CO2: 25 mmol/L (ref 22–32)
Calcium: 8.1 mg/dL — ABNORMAL LOW (ref 8.9–10.3)
Chloride: 97 mmol/L — ABNORMAL LOW (ref 98–111)
Creatinine, Ser: 0.76 mg/dL (ref 0.44–1.00)
GFR calc Af Amer: >60 mL/min (ref >60)
GFR calc non Af Amer: >60 mL/min (ref >60)
Glucose, Bld: 114 mg/dL — ABNORMAL HIGH (ref 70–99)
Potassium: 3.7 mmol/L (ref 3.5–5.1)
Sodium: 133 mmol/L — ABNORMAL LOW (ref 135–145)
Total Bilirubin: 0.4 mg/dL (ref 0.3–1.2)
Total Protein: 7 g/dL (ref 6.5–8.1)

## 2019-10-25 LAB — SEDIMENTATION RATE: Sed Rate: 15 mm/hr (ref 0–20)

## 2019-10-25 LAB — URINALYSIS, COMPLETE (UACMP) WITH MICROSCOPIC
Bacteria, UA: NONE SEEN
Bilirubin Urine: NEGATIVE
Glucose, UA: NEGATIVE mg/dL
Hgb urine dipstick: NEGATIVE
Ketones, ur: 20 mg/dL — AB
Leukocytes,Ua: NEGATIVE
Nitrite: NEGATIVE
Protein, ur: 30 mg/dL — AB
Specific Gravity, Urine: 1.02 (ref 1.005–1.030)
pH: 5 (ref 5.0–8.0)

## 2019-10-25 LAB — HIV ANTIBODY (ROUTINE TESTING W REFLEX): HIV Screen 4th Generation wRfx: NONREACTIVE

## 2019-10-25 LAB — CBC WITH DIFFERENTIAL/PLATELET
Abs Immature Granulocytes: 0.01 10*3/uL (ref 0.00–0.07)
Basophils Absolute: 0 10*3/uL (ref 0.0–0.1)
Basophils Relative: 0 %
Eosinophils Absolute: 0.4 10*3/uL (ref 0.0–0.5)
Eosinophils Relative: 16 %
HCT: 39 % (ref 36.0–46.0)
Hemoglobin: 13.8 g/dL (ref 12.0–15.0)
Immature Granulocytes: 0 %
Lymphocytes Relative: 18 %
Lymphs Abs: 0.5 10*3/uL — ABNORMAL LOW (ref 0.7–4.0)
MCH: 28.8 pg (ref 26.0–34.0)
MCHC: 35.4 g/dL (ref 30.0–36.0)
MCV: 81.3 fL (ref 80.0–100.0)
Monocytes Absolute: 0.1 10*3/uL (ref 0.1–1.0)
Monocytes Relative: 4 %
Neutro Abs: 1.7 10*3/uL (ref 1.7–7.7)
Neutrophils Relative %: 62 %
Platelets: 194 10*3/uL (ref 150–400)
RBC: 4.8 MIL/uL (ref 3.87–5.11)
RDW: 11.8 % (ref 11.5–15.5)
Smear Review: NORMAL
WBC: 2.7 10*3/uL — ABNORMAL LOW (ref 4.0–10.5)
nRBC: 0 % (ref 0.0–0.2)

## 2019-10-25 LAB — POC SARS CORONAVIRUS 2 AG: SARS Coronavirus 2 Ag: NEGATIVE

## 2019-10-25 LAB — POCT PREGNANCY, URINE: Preg Test, Ur: NEGATIVE

## 2019-10-25 LAB — C-REACTIVE PROTEIN: CRP: 6.8 mg/dL — ABNORMAL HIGH (ref <1.0)

## 2019-10-25 LAB — SARS CORONAVIRUS 2 (TAT 6-24 HRS): SARS Coronavirus 2: NEGATIVE

## 2019-10-25 LAB — LACTIC ACID, PLASMA
Lactic Acid, Venous: 1 mmol/L (ref 0.5–1.9)
Lactic Acid, Venous: 1.1 mmol/L (ref 0.5–1.9)

## 2019-10-25 MED ORDER — VANCOMYCIN HCL IN DEXTROSE 1-5 GM/200ML-% IV SOLN
1000.0000 mg | Freq: Once | INTRAVENOUS | Status: AC
  Administered 2019-10-25: 1000 mg via INTRAVENOUS
  Filled 2019-10-25: qty 200

## 2019-10-25 MED ORDER — METRONIDAZOLE IN NACL 5-0.79 MG/ML-% IV SOLN
500.0000 mg | Freq: Once | INTRAVENOUS | Status: AC
  Administered 2019-10-25: 500 mg via INTRAVENOUS
  Filled 2019-10-25: qty 100

## 2019-10-25 MED ORDER — SODIUM CHLORIDE 0.9% FLUSH: 3.0000 mL | Freq: Two times a day (BID) | INTRAVENOUS | Status: DC

## 2019-10-25 MED ORDER — DIPHENHYDRAMINE HCL 50 MG/ML IJ SOLN
25.0000 mg | Freq: Once | INTRAMUSCULAR | Status: AC
  Administered 2019-10-25: 25 mg via INTRAVENOUS
  Filled 2019-10-25: qty 1

## 2019-10-25 MED ORDER — KETOROLAC TROMETHAMINE 30 MG/ML IJ SOLN
INTRAMUSCULAR | Status: AC
  Administered 2019-10-25: 30 mg via INTRAVENOUS
  Filled 2019-10-25: qty 1

## 2019-10-25 MED ORDER — ENOXAPARIN SODIUM 40 MG/0.4ML ~~LOC~~ SOLN
40.0000 mg | SUBCUTANEOUS | Status: DC
  Filled 2019-10-25 (×2): qty 0.4

## 2019-10-25 MED ORDER — SODIUM CHLORIDE 0.9 % IV BOLUS
1000.0000 mL | Freq: Once | INTRAVENOUS | Status: AC
  Administered 2019-10-25: 1000 mL via INTRAVENOUS

## 2019-10-25 MED ORDER — SODIUM CHLORIDE 0.9 % IV SOLN: INTRAVENOUS | Status: DC

## 2019-10-25 MED ORDER — NICOTINE 7 MG/24HR TD PT24
7.0000 mg | MEDICATED_PATCH | Freq: Every day | TRANSDERMAL | Status: DC
  Administered 2019-10-25: 7 mg via TRANSDERMAL
  Filled 2019-10-25 (×2): qty 1

## 2019-10-25 MED ORDER — VITAMIN C 500 MG/5ML PO SYRP
500.0000 mg | ORAL_SOLUTION | Freq: Every day | ORAL | Status: DC
  Administered 2019-10-26: 500 mg via ORAL
  Filled 2019-10-25 (×2): qty 5

## 2019-10-25 MED ORDER — ONDANSETRON HCL 4 MG/2ML IJ SOLN: 4.0000 mg | Freq: Four times a day (QID) | INTRAMUSCULAR | Status: DC | PRN

## 2019-10-25 MED ORDER — ACETAMINOPHEN 325 MG PO TABS
650.0000 mg | ORAL_TABLET | Freq: Once | ORAL | Status: AC | PRN
  Administered 2019-10-25: 650 mg via ORAL
  Filled 2019-10-25: qty 2

## 2019-10-25 MED ORDER — KETOROLAC TROMETHAMINE 30 MG/ML IJ SOLN: 30.0000 mg | Freq: Once | INTRAMUSCULAR | Status: AC

## 2019-10-25 MED ORDER — LACTATED RINGERS IV BOLUS (SEPSIS)
1000.0000 mL | Freq: Once | INTRAVENOUS | Status: AC
  Administered 2019-10-25: 1000 mL via INTRAVENOUS

## 2019-10-25 MED ORDER — SODIUM CHLORIDE 0.9 % IV SOLN
2.0000 g | Freq: Once | INTRAVENOUS | Status: AC
  Administered 2019-10-25: 2 g via INTRAVENOUS
  Filled 2019-10-25: qty 2

## 2019-10-25 MED ORDER — TRAMADOL HCL 50 MG PO TABS
50.0000 mg | ORAL_TABLET | Freq: Four times a day (QID) | ORAL | Status: DC | PRN
  Administered 2019-10-25: 50 mg via ORAL
  Filled 2019-10-25: qty 1

## 2019-10-25 MED ORDER — ACETAMINOPHEN 325 MG PO TABS: 650.0000 mg | ORAL_TABLET | Freq: Four times a day (QID) | ORAL | Status: DC | PRN

## 2019-10-25 MED ORDER — ZINC SULFATE 220 (50 ZN) MG PO CAPS
220.0000 mg | ORAL_CAPSULE | Freq: Every day | ORAL | Status: DC
  Administered 2019-10-26: 220 mg via ORAL
  Filled 2019-10-25: qty 1

## 2019-10-25 NOTE — ED Notes (Signed)
EDP at bedside  

## 2019-10-25 NOTE — ED Notes (Signed)
Pt complaining of hip pain. Pt states she needs something for pain immediately. This RN administered tramadol as ordered PRN for pain.

## 2019-10-25 NOTE — ED Notes (Signed)
Pt taken to xray via stretcher  

## 2019-10-25 NOTE — H&P (Addendum)
History and Physical    Shannon Patel CWC:[id number] DOB: 04-Jun-1981 DOA: 10/25/2019  PCP: No PCP  Patient coming from: home    Chief Complaint: fever, left hip pain  HPI: 38 y/o F w/ PMH of bipolar disorder, polysubstance abuse, smoker who presents w/ subjective fevers and left hip pain x 3 days. The pain is dull, intermittent w/ radiation down both legs. Laying on the hip makes the pain worse and nothing makes the pain better. The severity is currently 5/10. Pt denies any recent falls and/or trauma. Of note, pt c/o subjective fevers x 3 days but never checked her temperature at home. Pt denies any contact w/ anyone COVID19 positive or any sick contacts in general. Pt denies any recent illicit drug use either. Pt also c/o intermittent chills, sweating. Also, pt was recently taking bactrim for impetigo. Pt denies any dizziness/lightheadedness, chest pain, shortness of breath, cough, nausea, vomiting, abd pain, dysuria, urinary frequency, urinary urgency, diarrhea or constipation.     Review of Systems: As per HPI otherwise 10 point review of systems negative.  A 10 point ROS is otherwise neg except for above listed in HPI   Past Medical History:  Diagnosis Date  . Bipolar 1 disorder (HCC)     History reviewed. No pertinent surgical history.   reports that she has been smoking cigarettes. She has a 5.00 pack-year smoking history. She uses smokeless tobacco. She reports current alcohol use of about 5.0 standard drinks of alcohol per week. She reports current drug use. Drug: Amphetamines.  No Known Allergies  Family History  Problem Relation Age of Onset  . Cirrhosis Father   . Hypertension Maternal Grandmother   . Hypertension Maternal Grandfather   . Heart attack Maternal Grandfather   . Heart attack Paternal Grandfather      Prior to Admission medications   Not on File    Physical Exam: Vitals:   10/25/19 1412 10/25/19 1414 10/25/19 1500 10/25/19 1530  BP:  (!) 96/59     Pulse:   88 86  Resp:  17 (!) 32 16  Temp: 98.8 F (37.1 C)     TempSrc: Oral     SpO2:   100% 100%  Weight:      Height:        Constitutional: NAD, calm, comfortable Vitals:   10/25/19 1412 10/25/19 1414 10/25/19 1500 10/25/19 1530  BP:  (!) 96/59    Pulse:   88 86  Resp:  17 (!) 32 16  Temp: 98.8 F (37.1 C)     TempSrc: Oral     SpO2:   100% 100%  Weight:      Height:       Eyes: PERRL, lids and conjunctivae normal ENMT: Mucous membranes are dry. Posterior pharynx clear of any exudate or lesions. Neck: normal, supple, no masses, no thyromegaly Respiratory: clear to auscultation bilaterally, no wheezing, no crackles. Normal respiratory effort. No accessory muscle use.  Cardiovascular: Normal S1, S2. no rubs / gallops. No extremity edema. 2+ pedal pulses.   Abdomen: Soft, NT, ND. Bowel sounds positive.  Musculoskeletal: no clubbing / cyanosis.Good ROM, no contractures. Normal muscle tone. Normal ROM of left hip all directions Skin: lesions, ulcers. Acne like lesions on face. Neurologic: CN 2-12 grossly intact. Sensation intact, DTR normal. Strength 5/5 in all 4.  Psychiatric: Normal judgment and insight. Flat mood and affect.     Labs on Admission: I have personally reviewed following labs and imaging studies  CBC: Recent Labs  Lab 10/25/19 1146  WBC 2.7*  NEUTROABS 1.7  HGB 13.8  HCT 39.0  MCV 81.3  PLT 350   Basic Metabolic Panel: Recent Labs  Lab 10/25/19 1146  NA 133*  K 3.7  CL 97*  CO2 25  GLUCOSE 114*  BUN 7  CREATININE 0.76  CALCIUM 8.1*   GFR: Estimated Creatinine Clearance: 78.9 mL/min (by C-G formula based on SCr of 0.76 mg/dL). Liver Function Tests: Recent Labs  Lab 10/25/19 1146  AST 84*  ALT 52*  ALKPHOS 53  BILITOT 0.4  PROT 7.0  ALBUMIN 3.6   No results for input(s): LIPASE, AMYLASE in the last 168 hours. No results for input(s): AMMONIA in the last 168 hours. Coagulation Profile: No results for input(s): INR, PROTIME  in the last 168 hours. Cardiac Enzymes: No results for input(s): CKTOTAL, CKMB, CKMBINDEX, TROPONINI in the last 168 hours. BNP (last 3 results) No results for input(s): PROBNP in the last 8760 hours. HbA1C: No results for input(s): HGBA1C in the last 72 hours. CBG: No results for input(s): GLUCAP in the last 168 hours. Lipid Profile: No results for input(s): CHOL, HDL, LDLCALC, TRIG, CHOLHDL, LDLDIRECT in the last 72 hours. Thyroid Function Tests: No results for input(s): TSH, T4TOTAL, FREET4, T3FREE, THYROIDAB in the last 72 hours. Anemia Panel: No results for input(s): VITAMINB12, FOLATE, FERRITIN, TIBC, IRON, RETICCTPCT in the last 72 hours. Urine analysis:    Component Value Date/Time   COLORURINE YELLOW (A) 10/25/2019 1400   APPEARANCEUR HAZY (A) 10/25/2019 1400   APPEARANCEUR Cloudy 01/14/2014 2027   LABSPEC 1.020 10/25/2019 1400   LABSPEC 1.011 01/14/2014 2027   PHURINE 5.0 10/25/2019 1400   GLUCOSEU NEGATIVE 10/25/2019 1400   GLUCOSEU Negative 01/14/2014 2027   HGBUR NEGATIVE 10/25/2019 1400   BILIRUBINUR NEGATIVE 10/25/2019 1400   BILIRUBINUR Negative 01/14/2014 2027   KETONESUR 20 (A) 10/25/2019 1400   PROTEINUR 30 (A) 10/25/2019 1400   NITRITE NEGATIVE 10/25/2019 1400   LEUKOCYTESUR NEGATIVE 10/25/2019 1400   LEUKOCYTESUR Trace 01/14/2014 2027    Radiological Exams on Admission: DG Chest 2 View  Result Date: 10/25/2019 CLINICAL DATA:  Fever EXAM: CHEST - 2 VIEW COMPARISON:  January 05, 2015 FINDINGS: The lungs are clear. The heart size and pulmonary vascularity are normal. No adenopathy. No bone lesions. IMPRESSION: No edema or consolidation.  No evident adenopathy. Electronically Signed   By: Lowella Grip III M.D.   On: 10/25/2019 14:42   DG Hip Unilat W or Wo Pelvis 2-3 Views Left  Result Date: 10/25/2019 CLINICAL DATA:  Pain and fever EXAM: DG HIP (WITH OR WITHOUT PELVIS) 2-3V LEFT COMPARISON:  None. FINDINGS: Frontal pelvis as well as frontal and  lateral left hip images were obtained. No fracture or dislocation. Joint spaces appear normal. No erosive change. IMPRESSION: No fracture or dislocation.  No evident arthropathy. Electronically Signed   By: Lowella Grip III M.D.   On: 10/25/2019 14:42    EKG: Independently reviewed.   Assessment/Plan Active Problems:   Sepsis ([place])  Sepsis: etiology unclear. Possibly secondary to [place] which is pending. Tylenol prn for fevers. Blood cxs pending. Urine drug screen neg. UA is neg. Continue on IVFs. S/p flagyl & vanco given x 1 in ER. Will hold off on further abxs at this time. CXR & Hip XR are WNL. Will start vit C and zinc  Leukopenia: likely secondary to viral infection, possible COVID19. Will continue to monitor  Left hip pain: etiology unclear, possibly secondary viral illness. XR of  left hip is WNL. Tylenol and tramadol prn for pain   Transaminits: etiology unclear, COVID19 infection. Will continue to monitor  Hyponatremia: will continue on IVFs.   Hypotension: improved w/ IVFs. Keep MAP >65.  Will continue to monitor  Hyperglycemia: no hx of DM. Will continue to monitor  Bipolar disorder: unknown type or severity. No meds in home med list at this time  Hx of polysubstance abuse: urine drug screen neg  Smoker: smoking cessation counseling. Nicotine patch.     DVT prophylaxis: lovenox Code Status: full  Family Communication:  Disposition Plan:  Consults called: n/a Admission status: inpatient   Charise KillianJamiese M Emmalin Jaquess MD [facility] Pager   If 7PM-7AM, please contact night-coverage [email or web address] Password [email or web address]  10/25/2019, 5:51 PM
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
## 2019-10-25 NOTE — ED Notes (Signed)
Pt assisted to the bathroom by EDT.

## 2019-10-25 NOTE — ED Notes (Signed)
Pt ambulatory to toilet to urinate.  

## 2019-10-25 NOTE — ED Notes (Signed)
Pt ambulatory to toilet with steady gait noted.  

## 2019-10-25 NOTE — Progress Notes (Signed)
CODE SEPSIS - PHARMACY COMMUNICATION  **Broad Spectrum Antibiotics should be administered within 1 hour of Sepsis diagnosis**  Time Code Sepsis Called/Page Received: @1326   Antibiotics Ordered: cefepime/vancomycin/metronidazole  Time of 1st antibiotic administration: @1412  metronidazole   Additional action taken by pharmacy: N/A  If necessary, Name of Provider/Nurse Contacted: N/A    Rowland Lathe ,PharmD Clinical Pharmacist  10/25/2019  1:39 PM

## 2019-10-25 NOTE — Progress Notes (Signed)
Sepsis bundle complete. Lactic acid is normal. VSS. antibiotics received.

## 2019-10-25 NOTE — ED Notes (Signed)
Per MD issacs, 1 stick for blood culture draw in triage. 1 set of blood cultures sent to lab at this time

## 2019-10-25 NOTE — Consult Note (Signed)
PHARMACY -  BRIEF ANTIBIOTIC NOTE   Pharmacy has received consult(s) for Cefepime/Vancomycin from an ED provider.  The patient's profile has been reviewed for ht/wt/allergies/indication/available labs.    One time order(s) placed for Cefepime 2gx1 and Vancomycin 1g x1 dose.   Further antibiotics/pharmacy consults should be ordered by admitting physician if indicated.                       Thank you, Rowland Lathe 10/25/2019  1:40 PM

## 2019-10-25 NOTE — ED Notes (Signed)
Pt provided crackers and cola to drink.

## 2019-10-25 NOTE — ED Notes (Signed)
Pt has multiple complaints. States L hip pain. C/o vaginal itching. C/o abd pain. C/o nausea. Denies diarrhea. denies COVID contacts. Denies urinary symptoms. States hasn't eaten anything today. A&O.
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
## 2019-10-26 ENCOUNTER — Inpatient Hospital Stay: Payer: Self-pay

## 2019-10-26 ENCOUNTER — Encounter: Payer: Self-pay | Admitting: Internal Medicine

## 2019-10-26 DIAGNOSIS — B349 Viral infection, unspecified: Principal | ICD-10-CM

## 2019-10-26 LAB — CBC WITH DIFFERENTIAL/PLATELET
Abs Immature Granulocytes: 0.01 10*3/uL (ref 0.00–0.07)
Basophils Absolute: 0 10*3/uL (ref 0.0–0.1)
Basophils Relative: 0 %
Eosinophils Absolute: 0.4 10*3/uL (ref 0.0–0.5)
Eosinophils Relative: 13 %
HCT: 31.7 % — ABNORMAL LOW (ref 36.0–46.0)
Hemoglobin: 10.9 g/dL — ABNORMAL LOW (ref 12.0–15.0)
Immature Granulocytes: 0 %
Lymphocytes Relative: 53 %
Lymphs Abs: 1.5 10*3/uL (ref 0.7–4.0)
MCH: 28.8 pg (ref 26.0–34.0)
MCHC: 34.4 g/dL (ref 30.0–36.0)
MCV: 83.6 fL (ref 80.0–100.0)
Monocytes Absolute: 0.3 10*3/uL (ref 0.1–1.0)
Monocytes Relative: 10 %
Neutro Abs: 0.7 10*3/uL — ABNORMAL LOW (ref 1.7–7.7)
Neutrophils Relative %: 24 %
Platelets: 161 10*3/uL (ref 150–400)
RBC: 3.79 MIL/uL — ABNORMAL LOW (ref 3.87–5.11)
RDW: 11.9 % (ref 11.5–15.5)
Smear Review: NORMAL
WBC: 2.8 10*3/uL — ABNORMAL LOW (ref 4.0–10.5)
nRBC: 0 % (ref 0.0–0.2)

## 2019-10-26 LAB — COMPREHENSIVE METABOLIC PANEL
ALT: 64 U/L — ABNORMAL HIGH (ref 0–44)
AST: 92 U/L — ABNORMAL HIGH (ref 15–41)
Albumin: 2.9 g/dL — ABNORMAL LOW (ref 3.5–5.0)
Alkaline Phosphatase: 45 U/L (ref 38–126)
Anion gap: 7 (ref 5–15)
BUN: 6 mg/dL (ref 6–20)
CO2: 25 mmol/L (ref 22–32)
Calcium: 7.6 mg/dL — ABNORMAL LOW (ref 8.9–10.3)
Chloride: 104 mmol/L (ref 98–111)
Creatinine, Ser: 0.48 mg/dL (ref 0.44–1.00)
GFR calc Af Amer: >60 mL/min (ref >60)
GFR calc non Af Amer: >60 mL/min (ref >60)
Glucose, Bld: 110 mg/dL — ABNORMAL HIGH (ref 70–99)
Potassium: 3.5 mmol/L (ref 3.5–5.1)
Sodium: 136 mmol/L (ref 135–145)
Total Bilirubin: 0.4 mg/dL (ref 0.3–1.2)
Total Protein: 5.5 g/dL — ABNORMAL LOW (ref 6.5–8.1)

## 2019-10-26 MED ORDER — GADOBUTROL 1 MMOL/ML IV SOLN
6.0000 mL | Freq: Once | INTRAVENOUS | Status: AC | PRN
  Administered 2019-10-26: 6 mL via INTRAVENOUS
  Filled 2019-10-26: qty 6

## 2019-10-26 MED ORDER — KETOROLAC TROMETHAMINE 30 MG/ML IJ SOLN: 30.0000 mg | Freq: Three times a day (TID) | INTRAMUSCULAR | Status: DC | PRN

## 2019-10-26 MED ORDER — OXYCODONE HCL 5 MG PO TABS
5.0000 mg | ORAL_TABLET | Freq: Four times a day (QID) | ORAL | Status: DC | PRN
  Administered 2019-10-26: 5 mg via ORAL
  Filled 2019-10-26: qty 1

## 2019-10-26 MED ORDER — DIPHENHYDRAMINE HCL 25 MG PO CAPS
25.0000 mg | ORAL_CAPSULE | Freq: Once | ORAL | Status: AC
  Administered 2019-10-26: 25 mg via ORAL
  Filled 2019-10-26: qty 1

## 2019-10-26 MED ORDER — MORPHINE SULFATE (PF) 2 MG/ML IV SOLN
INTRAVENOUS | Status: AC
  Filled 2019-10-26: qty 1

## 2019-10-26 MED ORDER — ACETAMINOPHEN 325 MG PO TABS
650.0000 mg | ORAL_TABLET | Freq: Four times a day (QID) | ORAL | Status: DC | PRN
  Administered 2019-10-26: 650 mg via ORAL
  Filled 2019-10-26: qty 2

## 2019-10-26 MED ORDER — MORPHINE SULFATE (PF) 4 MG/ML IV SOLN
3.0000 mg | Freq: Once | INTRAVENOUS | Status: AC
  Administered 2019-10-26: 3 mg via INTRAVENOUS
  Filled 2019-10-26: qty 1

## 2019-10-26 MED ORDER — MORPHINE SULFATE (PF) 2 MG/ML IV SOLN
2.0000 mg | INTRAVENOUS | Status: DC | PRN
  Administered 2019-10-26 (×2): 2 mg via INTRAVENOUS
  Filled 2019-10-26: qty 1

## 2019-10-26 NOTE — Discharge Summary (Signed)
Physician Discharge Summary  Shannon Patel ZOX:[id number] DOB: 09-26-1981 DOA: 10/25/2019  PCP: Patient, No Pcp Per  Admit date: 10/25/2019 Discharge date: 10/26/2019  Admitted From: Home  Disposition:  Home   Recommendations for Outpatient Follow-up:  1. Obtain new PCP 2. Follow up LFTs in 4-6 weeks      Home Health: None  Equipment/Devices: None  Discharge Condition: Good  CODE STATUS: FULL Diet recommendation: Regular  Brief/Interim Summary: Shannon Patel is a 38 y.o. F with bipolar disorder not on medication, polysubstance abuse, recent skin abscesses and hx smoking who presented with fever, chills, sweats and hip pain for 3 days.  No prior trauma.  In the ER, X-ray left hip normal.  Temp 103F and blood pressure soft, so empiric antibiotics given and admitted for hip pain.        PRINCIPAL HOSPITAL DIAGNOSIS: Unspecified viral syndrome    Discharge Diagnoses:   Fever Hip and low back pain Patient admitted with left hip pain, also low back pain radiating into both legs.  No loss of bowel or bladder function, but fever noted.  Previous history of substance use, patient denied IVDU, but had had recent numerous soft tissue/skin infections, suspicious for MRSA.    Differential considered was lumbar ESA, osteomyelitis of femur or vertebrae.  Septic arthritis ruled out with exam.  Doubt streptoccal infection related rash, suspect this was allergic to Bactrim.  SJS/TEN ruled out.  COVID, flu, HIV, ruled out.  Disseminated gonococcal infection considered, but recent negative GC and rash resolved with diphenhydramine, and do not suspect actual hip joint involvement based on exam.  Acute HIV doubted, but RNA quant pending.  MRI lumbar spine and hip showed no evidence of septic arthritis or osteomyelitis of the hip, nor evidence of vertebral osteomyelitis or epidural spinal abscess.  GC sent.  Blood cultures negative.   -Follow up GC and HIV "as an outpatient   Rash Patient  had developed an urticarial, itching rash on the trunk and thighs after taking Bactrim for 1 week.  She has finished Bactrim, the rash is improving.  It got better overnight as well with diphenhydramine.  Transaminitis Unclear cause.  Hepatitis serologies negative last month.  HIV negative.  Denies alcohol use.  No RUQ tenderness to suggest cholangitis, cholecystitis. -Repeat LFTs in 1 month -Alcohol cessation recommended    Normocytic anemia Mild, stable, no clinical bleeding.            Discharge Instructions  Discharge Instructions    Discharge instructions   Complete by: As directed    From Dr. Maryfrances Bunnell: You were admitted for back pain and fever. I am unsure what the fever was from, but we have ruled out several serious things (you do not have COVID or the flu, you do not have a spine infection or a blood stream infection or pneumonia or bladder infection or joint infection in the hip).    You should rest and push fluids.  You should take ibuprofen for pain (take 400 mg/2 tabs up to three times per day).  You may alternate with acetaminophen, but use this sparingly (only use 500 mg twice daily)  Have your liver function tested in 1 month Arrange follow up with a primary care doctor  If the rash bothers you, take diphenhydramine/Benadryl 25 mg up to three times per day   Increase activity slowly   Complete by: As directed      Allergies as of 10/26/2019   No Known Allergies  Medication List    You have not been prescribed any medications.     No Known Allergies  Consultations:     Procedures/Studies: DG Chest 2 View  Result Date: 10/25/2019 CLINICAL DATA:  Fever EXAM: CHEST - 2 VIEW COMPARISON:  January 05, 2015 FINDINGS: The lungs are clear. The heart size and pulmonary vascularity are normal. No adenopathy. No bone lesions. IMPRESSION: No edema or consolidation.  No evident adenopathy. Electronically Signed   By: Bretta Bang III M.D.   On:  10/25/2019 14:42   MR Lumbar Spine W Wo Contrast  Result Date: 10/26/2019 CLINICAL DATA:  Low back pain, polysubstance abuse EXAM: MRI LUMBAR SPINE WITHOUT AND WITH CONTRAST TECHNIQUE: Multiplanar and multiecho pulse sequences of the lumbar spine were obtained without and with intravenous contrast. CONTRAST:  6mL GADAVIST GADOBUTROL 1 MMOL/ML IV SOLN COMPARISON:  None. FINDINGS: Segmentation:  Standard. Alignment:  No significant listhesis. Vertebrae: There is decreased T1 marrow signal, which may reflect hematopoietic marrow. There is no marrow edema or suspicious osseous lesion. Small degenerative Schmorl's nodes are present. Conus medullaris and cauda equina: Conus extends to the L1-L2 level. Conus and cauda equina appear normal. No abnormal intrathecal enhancement. Paraspinal and other soft tissues: Unremarkable. Disc levels: No disc edema or enhancement. Intervertebral disc heights are maintained. There are small central protrusions at L4-L5 and L5-S1. There is no canal or neural foraminal stenosis at any level. IMPRESSION: No evidence of discitis/osteomyelitis or abscess. Minor lower lumbar degenerative changes. Electronically Signed   By: Guadlupe Spanish M.D.   On: 10/26/2019 12:28   MR HIP LEFT WO CONTRAST  Result Date: 10/26/2019 CLINICAL DATA:  Fever and left hip pain in a patient with a history of polysubstance abuse. No known injury. EXAM: MR OF THE LEFT HIP WITHOUT CONTRAST TECHNIQUE: Multiplanar, multisequence MR imaging was performed. No intravenous contrast was administered. COMPARISON:  None. FINDINGS: Bones: Marrow signal is normal throughout without evidence of osteomyelitis, fracture, stress change or focal lesion. No avascular necrosis of the femoral heads. Articular cartilage and labrum Articular cartilage:  Normal. Labrum:  Intact. Joint or bursal effusion Joint effusion:  None. Bursae: Negative. Muscles and tendons Muscles and tendons:  Intact and normal in appearance. Other findings  Miscellaneous: Small bilateral external iliac and right groin are noted. IMPRESSION: Negative for septic joint or osteomyelitis. No acute or focal abnormality. Small bilateral external iliac and right groin lymph nodes are likely incidental rather than reactive. Electronically Signed   By: Drusilla Kanner M.D.   On: 10/26/2019 12:30   DG Hip Unilat W or Wo Pelvis 2-3 Views Left  Result Date: 10/25/2019 CLINICAL DATA:  Pain and fever EXAM: DG HIP (WITH OR WITHOUT PELVIS) 2-3V LEFT COMPARISON:  None. FINDINGS: Frontal pelvis as well as frontal and lateral left hip images were obtained. No fracture or dislocation. Joint spaces appear normal. No erosive change. IMPRESSION: No fracture or dislocation.  No evident arthropathy. Electronically Signed   By: Bretta Bang III M.D.   On: 10/25/2019 14:42      Subjective: She has some puffiness in her arms from her IV fluids, but no joint pains.  She has some nonspecific pains, "shooting", in her arms and legs bilaterally.  Normal ROM of the left hip.  No more fever.  No more rash.    Discharge Exam: Vitals:   10/25/19 2047 10/26/19 0827  BP: 100/67 113/77  Pulse: 81 77  Resp: (!) 22 18  Temp: 98.2 F (36.8 C)   SpO2:  97% 98%   Vitals:   10/25/19 1500 10/25/19 1530 10/25/19 2047 10/26/19 0827  BP:   100/67 113/77  Pulse: 88 86 81 77  Resp: (!) 32 16 (!) 22 18  Temp:   98.2 F (36.8 C)   TempSrc:   Oral   SpO2: 100% 100% 97% 98%  Weight:      Height:        General: Pt is alert, awake, not in acute distress Cardiovascular: RRR, nl S1-S2, no murmurs appreciated.   No LE edema.   Skin: No rash noted on arms, chest, abdomen, legs, or arms. Respiratory: Normal respiratory rate and rhythm.  CTAB without rales or wheezes. MSK: Left hip ROM painful with active ROM, complete passive ROM possible without pain.  Abdominal: Abdomen soft and non-tender.  No RUQ pain or rebound.No distension or HSM.   Neuro/Psych: Strength symmetric in upper and  lower extremities.  Judgment and insight appear normal.   The results of significant diagnostics from this hospitalization (including imaging, microbiology, ancillary and laboratory) are listed below for reference.     Microbiology: Recent Results (from the past 240 hour(s))  Blood culture (routine x 2)     Status: None (Preliminary result)   Collection Time: 10/25/19 11:46 AM   Specimen: BLOOD  Result Value Ref Range Status   Specimen Description BLOOD LEFT ANTECUBITAL  Final   Special Requests   Final    BOTTLES DRAWN AEROBIC AND ANAEROBIC Blood Culture adequate volume   Culture   Final    NO GROWTH < 24 HOURS Performed at [facility], [address] [place], [place] [address]    Report Status PENDING  Incomplete  Blood culture (routine x 2)     Status: None (Preliminary result)   Collection Time: 10/25/19  1:48 PM   Specimen: BLOOD  Result Value Ref Range Status   Specimen Description BLOOD RIGHT ANTECUBITAL  Final   Special Requests   Final    BOTTLES DRAWN AEROBIC AND ANAEROBIC Blood Culture adequate volume   Culture   Final    NO GROWTH < 24 HOURS Performed at [facility], [address], [place], [place] [address]    Report Status PENDING  Incomplete  SARS CORONAVIRUS 2 (TAT 6-24 HRS) Nasopharyngeal Nasopharyngeal Swab     Status: None   Collection Time: 10/25/19  3:31 PM   Specimen: Nasopharyngeal Swab  Result Value Ref Range Status   SARS Coronavirus 2 NEGATIVE NEGATIVE Final    Comment: (NOTE) SARS-CoV-2 target nucleic acids are NOT DETECTED. The SARS-CoV-2 RNA is generally detectable in upper and lower respiratory specimens during the acute phase of infection. Negative results do not preclude SARS-CoV-2 infection, do not rule out co-infections with other pathogens, and should not be used as the sole basis for treatment or other patient management decisions. Negative results must be combined with clinical observations, patient history, and  epidemiological information. The expected result is Negative. Fact Sheet for Patients: HairSlick.no Fact Sheet for Healthcare Providers: quierodirigir.com This test is not yet approved or cleared by the [place] FDA and  has been authorized for detection and/or diagnosis of SARS-CoV-2 by FDA under an Emergency Use Authorization (EUA). This EUA will remain  in effect (meaning this test can be used) for the duration of the COVID-19 declaration under Section 56 4(b)(1) of the Act, 21 U.S.C. section 360bbb-3(b)(1), unless the authorization is terminated or revoked sooner. Performed at [facility], [address] [place], [place]  [id number]      Labs: BNP (last 3 results) No results for input(s): BNP in the last 8760 hours. Basic Metabolic Panel: Recent Labs  Lab 10/25/19 1146 10/26/19 0557  NA 133* 136  K 3.7 3.5  CL 97* 104  CO2 25 25  GLUCOSE 114* 110*  BUN 7 6  CREATININE 0.76 0.48  CALCIUM 8.1* 7.6*   Liver Function Tests: Recent Labs  Lab 10/25/19 1146 10/26/19 0557  AST 84* 92*  ALT 52* 64*  ALKPHOS 53 45  BILITOT 0.4 0.4  PROT 7.0 5.5*  ALBUMIN 3.6 2.9*   No results for input(s): LIPASE, AMYLASE in the last 168 hours. No results for input(s): AMMONIA in the last 168 hours. CBC: Recent Labs  Lab 10/25/19 1146 10/26/19 0557  WBC 2.7* 2.8*  NEUTROABS 1.7 0.7*  HGB 13.8 10.9*  HCT 39.0 31.7*  MCV 81.3 83.6  PLT 194 161   Cardiac Enzymes: No results for input(s): CKTOTAL, CKMB, CKMBINDEX, TROPONINI in the last 168 hours. BNP: Invalid input(s): POCBNP CBG: No results for input(s): GLUCAP in the last 168 hours. D-Dimer No results for input(s): DDIMER in the last 72 hours. Hgb A1c No results for input(s): HGBA1C in the last 72 hours. Lipid Profile No results for input(s): CHOL, HDL, LDLCALC, TRIG, CHOLHDL, LDLDIRECT in the last 72 hours. Thyroid function studies No results for  input(s): TSH, T4TOTAL, T3FREE, THYROIDAB in the last 72 hours.  Invalid input(s): FREET3 Anemia work up No results for input(s): VITAMINB12, FOLATE, FERRITIN, TIBC, IRON, RETICCTPCT in the last 72 hours. Urinalysis    Component Value Date/Time   COLORURINE YELLOW (A) 10/25/2019 1400   APPEARANCEUR HAZY (A) 10/25/2019 1400   APPEARANCEUR Cloudy 01/14/2014 2027   LABSPEC 1.020 10/25/2019 1400   LABSPEC 1.011 01/14/2014 2027   PHURINE 5.0 10/25/2019 1400   GLUCOSEU NEGATIVE 10/25/2019 1400   GLUCOSEU Negative 01/14/2014 2027   HGBUR NEGATIVE 10/25/2019 1400   BILIRUBINUR NEGATIVE 10/25/2019 1400   BILIRUBINUR Negative 01/14/2014 2027   KETONESUR 20 (A) 10/25/2019 1400   PROTEINUR 30 (A) 10/25/2019 1400   NITRITE NEGATIVE 10/25/2019 1400   LEUKOCYTESUR NEGATIVE 10/25/2019 1400   LEUKOCYTESUR Trace 01/14/2014 2027   Sepsis Labs Invalid input(s): PROCALCITONIN,  WBC,  LACTICIDVEN Microbiology Recent Results (from the past 240 hour(s))  Blood culture (routine x 2)     Status: None (Preliminary result)   Collection Time: 10/25/19 11:46 AM   Specimen: BLOOD  Result Value Ref Range Status   Specimen Description BLOOD LEFT ANTECUBITAL  Final   Special Requests   Final    BOTTLES DRAWN AEROBIC AND ANAEROBIC Blood Culture adequate volume   Culture   Final    NO GROWTH < 24 HOURS Performed at [facility], [address] [place], [place] [id number]    Report Status PENDING  Incomplete  Blood culture (routine x 2)     Status: None (Preliminary result)   Collection Time: 10/25/19  1:48 PM   Specimen: BLOOD  Result Value Ref Range Status   Specimen Description BLOOD RIGHT ANTECUBITAL  Final   Special Requests   Final    BOTTLES DRAWN AEROBIC AND ANAEROBIC Blood Culture adequate volume   Culture   Final    NO GROWTH < 24 HOURS Performed at [facility], [address] [place], [place] [id number]    Report Status PENDING  Incomplete  SARS CORONAVIRUS 2 (TAT 6-24  HRS) Nasopharyngeal Nasopharyngeal Swab     Status: None  Collection Time: 10/25/19  3:31 PM   Specimen: Nasopharyngeal Swab  Result Value Ref Range Status   SARS Coronavirus 2 NEGATIVE NEGATIVE Final    Comment: (NOTE) SARS-CoV-2 target nucleic acids are NOT DETECTED. The SARS-CoV-2 RNA is generally detectable in upper and lower respiratory specimens during the acute phase of infection. Negative results do not preclude SARS-CoV-2 infection, do not rule out co-infections with other pathogens, and should not be used as the sole basis for treatment or other patient management decisions. Negative results must be combined with clinical observations, patient history, and epidemiological information. The expected result is Negative. Fact Sheet for Patients: SugarRoll.be Fact Sheet for Healthcare Providers: https://www.woods-mathews.com/ This test is not yet approved or cleared by the [place] FDA and  has been authorized for detection and/or diagnosis of SARS-CoV-2 by FDA under an Emergency Use Authorization (EUA). This EUA will remain  in effect (meaning this test can be used) for the duration of the COVID-19 declaration under Section 56 4(b)(1) of the Act, 21 U.S.C. section 360bbb-3(b)(1), unless the authorization is terminated or revoked sooner. Performed at [facility], [place] [address], [place], [place] [address]      Time coordinating discharge: 35 minutes      SIGNED:   Edwin Dada, MD  [facility] 10/26/2019, 3:18 PM

## 2019-10-26 NOTE — ED Notes (Signed)
Resting in bed, states excruciating pain in her left hip and "all over her body." NAD. Morphine given per order. Lungs clear, bed locked and low, call bell in reach.

## 2019-10-26 NOTE — ED Notes (Signed)
Pt repeatedly calling out for pain medicine. Pt advised that pain medicine can not be given off schedule. Pt given 25mg  of benadryl per MD orders at 0135. Pt given Tramadol at 2015. Pt refused tylenol stating, "That doesn't help at all."   Pt becoming irrate and cussing at this RN and Annie Main, Therapist, sports. Pt stating she will leave if we don't get her pain under control. Pt assured that we have been in contact with the doctor and are giving pain medicine as order to keep her as comfortable as possible. Pt stating, "She fucking can't believe the care she has received, no one has cared about her pain or got in under control." Pt also stating she wants to see the doctor. MD made aware.

## 2019-10-26 NOTE — ED Notes (Signed)
Patient transported to MRI 

## 2019-10-28 LAB — GC/CHLAMYDIA PROBE AMP
Chlamydia trachomatis, NAA: NEGATIVE
Neisseria Gonorrhoeae by PCR: NEGATIVE

## 2019-10-30 LAB — CULTURE, BLOOD (ROUTINE X 2)
Culture: NO GROWTH
Culture: NO GROWTH
Special Requests: ADEQUATE
Special Requests: ADEQUATE

## 2019-11-02 ENCOUNTER — Emergency Department: Payer: No Typology Code available for payment source

## 2019-11-02 ENCOUNTER — Encounter: Payer: Self-pay | Admitting: Emergency Medicine

## 2019-11-02 ENCOUNTER — Other Ambulatory Visit: Payer: Self-pay

## 2019-11-02 ENCOUNTER — Emergency Department
Admission: EM | Admit: 2019-11-02 | Discharge: 2019-11-02 | Disposition: A | Discharge status: Home / Self Care | Payer: No Typology Code available for payment source | Attending: Emergency Medicine | Admitting: Emergency Medicine

## 2019-11-02 DIAGNOSIS — S161XXA Strain of muscle, fascia and tendon at neck level, initial encounter: Secondary | ICD-10-CM | POA: Diagnosis not present

## 2019-11-02 DIAGNOSIS — Y999 Unspecified external cause status: Secondary | ICD-10-CM | POA: Diagnosis not present

## 2019-11-02 DIAGNOSIS — S0990XA Unspecified injury of head, initial encounter: Secondary | ICD-10-CM | POA: Diagnosis present

## 2019-11-02 DIAGNOSIS — Y9389 Activity, other specified: Secondary | ICD-10-CM | POA: Insufficient documentation

## 2019-11-02 DIAGNOSIS — F17210 Nicotine dependence, cigarettes, uncomplicated: Secondary | ICD-10-CM | POA: Insufficient documentation

## 2019-11-02 DIAGNOSIS — M7918 Myalgia, other site: Secondary | ICD-10-CM

## 2019-11-02 DIAGNOSIS — Y92410 Unspecified street and highway as the place of occurrence of the external cause: Secondary | ICD-10-CM | POA: Diagnosis not present

## 2019-11-02 DIAGNOSIS — S0003XA Contusion of scalp, initial encounter: Secondary | ICD-10-CM

## 2019-11-02 LAB — URINALYSIS, COMPLETE (UACMP) WITH MICROSCOPIC
Bacteria, UA: NONE SEEN
Bilirubin Urine: NEGATIVE
Glucose, UA: NEGATIVE mg/dL
Hgb urine dipstick: NEGATIVE
Ketones, ur: NEGATIVE mg/dL
Leukocytes,Ua: NEGATIVE
Nitrite: NEGATIVE
Protein, ur: NEGATIVE mg/dL
Specific Gravity, Urine: 1.004 — ABNORMAL LOW (ref 1.005–1.030)
pH: 8 (ref 5.0–8.0)

## 2019-11-02 LAB — POCT PREGNANCY, URINE: Preg Test, Ur: NEGATIVE

## 2019-11-02 MED ORDER — METHOCARBAMOL 500 MG PO TABS: 500.0000 mg | ORAL_TABLET | Freq: Four times a day (QID) | ORAL | 0 refills | Status: DC

## 2019-11-02 MED ORDER — HYDROCODONE-ACETAMINOPHEN 5-325 MG PO TABS
1.0000 | ORAL_TABLET | Freq: Once | ORAL | Status: AC
  Administered 2019-11-02: 1 via ORAL
  Filled 2019-11-02: qty 1

## 2019-11-02 MED ORDER — HYDROCODONE-ACETAMINOPHEN 5-325 MG PO TABS: 1.0000 | ORAL_TABLET | ORAL | 0 refills | Status: DC | PRN

## 2019-11-02 NOTE — ED Notes (Signed)

## 2019-11-02 NOTE — Discharge Instructions (Addendum)
Follow-up with your primary care provider if any continued problems or concerns.  Begin taking medication only as prescribed.  The Norco may cause drowsiness and increase your risk for falling.  Also the methocarbamol is for muscle spasms.  You may use ice or heat to your muscles as needed for discomfort.  It is normal for you to be sore and stiff for the next 4 to 5 days even with medication.
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
## 2019-11-11 ENCOUNTER — Other Ambulatory Visit: Payer: Self-pay

## 2019-11-11 DIAGNOSIS — L02416 Cutaneous abscess of left lower limb: Secondary | ICD-10-CM | POA: Insufficient documentation

## 2019-11-11 DIAGNOSIS — F17210 Nicotine dependence, cigarettes, uncomplicated: Secondary | ICD-10-CM | POA: Insufficient documentation

## 2019-11-11 DIAGNOSIS — Z79899 Other long term (current) drug therapy: Secondary | ICD-10-CM | POA: Insufficient documentation

## 2019-11-11 DIAGNOSIS — F17220 Nicotine dependence, chewing tobacco, uncomplicated: Secondary | ICD-10-CM | POA: Insufficient documentation

## 2019-11-11 DIAGNOSIS — L03116 Cellulitis of left lower limb: Secondary | ICD-10-CM | POA: Insufficient documentation

## 2019-11-11 LAB — CBC WITH DIFFERENTIAL/PLATELET
Abs Immature Granulocytes: 0.04 10*3/uL (ref 0.00–0.07)
Basophils Absolute: 0.1 10*3/uL (ref 0.0–0.1)
Basophils Relative: 1 %
Eosinophils Absolute: 0.2 10*3/uL (ref 0.0–0.5)
Eosinophils Relative: 1 %
HCT: 38.6 % (ref 36.0–46.0)
Hemoglobin: 12.4 g/dL (ref 12.0–15.0)
Immature Granulocytes: 0 %
Lymphocytes Relative: 28 %
Lymphs Abs: 3.3 10*3/uL (ref 0.7–4.0)
MCH: 27.7 pg (ref 26.0–34.0)
MCHC: 32.1 g/dL (ref 30.0–36.0)
MCV: 86.4 fL (ref 80.0–100.0)
Monocytes Absolute: 1 10*3/uL (ref 0.1–1.0)
Monocytes Relative: 8 %
Neutro Abs: 7.3 10*3/uL (ref 1.7–7.7)
Neutrophils Relative %: 62 %
Platelets: 396 10*3/uL (ref 150–400)
RBC: 4.47 MIL/uL (ref 3.87–5.11)
RDW: 11.9 % (ref 11.5–15.5)
WBC: 11.8 10*3/uL — ABNORMAL HIGH (ref 4.0–10.5)
nRBC: 0 % (ref 0.0–0.2)

## 2019-11-11 LAB — BASIC METABOLIC PANEL
Anion gap: 11 (ref 5–15)
BUN: 10 mg/dL (ref 6–20)
CO2: 29 mmol/L (ref 22–32)
Calcium: 8.8 mg/dL — ABNORMAL LOW (ref 8.9–10.3)
Chloride: 101 mmol/L (ref 98–111)
Creatinine, Ser: 0.47 mg/dL (ref 0.44–1.00)
GFR calc Af Amer: >60 mL/min (ref >60)
GFR calc non Af Amer: >60 mL/min (ref >60)
Glucose, Bld: 101 mg/dL — ABNORMAL HIGH (ref 70–99)
Potassium: 3.4 mmol/L — ABNORMAL LOW (ref 3.5–5.1)
Sodium: 141 mmol/L (ref 135–145)

## 2019-11-11 NOTE — ED Triage Notes (Signed)
Patient reports right knee pain for 3 days.  Patient with redness and swelling noted, reports small area "popped" earlier today.  Patient also reports swelling in right upper leg.

## 2019-11-12 ENCOUNTER — Emergency Department
Admission: EM | Admit: 2019-11-12 | Discharge: 2019-11-12 | Disposition: A | Discharge status: Home / Self Care | Payer: Medicaid Other | Attending: Emergency Medicine | Admitting: Emergency Medicine

## 2019-11-12 DIAGNOSIS — L03116 Cellulitis of left lower limb: Secondary | ICD-10-CM

## 2019-11-12 DIAGNOSIS — L0291 Cutaneous abscess, unspecified: Secondary | ICD-10-CM

## 2019-11-12 MED ORDER — CLINDAMYCIN HCL 300 MG PO CAPS: 300.0000 mg | ORAL_CAPSULE | Freq: Three times a day (TID) | ORAL | 0 refills | Status: AC

## 2019-11-12 MED ORDER — IBUPROFEN 800 MG PO TABS: 800.0000 mg | ORAL_TABLET | Freq: Three times a day (TID) | ORAL | 0 refills | Status: DC | PRN

## 2019-11-12 MED ORDER — CLINDAMYCIN PHOSPHATE 600 MG/50ML IV SOLN
600.0000 mg | Freq: Once | INTRAVENOUS | Status: AC
  Administered 2019-11-12: 600 mg via INTRAVENOUS
  Filled 2019-11-12: qty 50

## 2019-11-12 MED ORDER — KETOROLAC TROMETHAMINE 30 MG/ML IJ SOLN
15.0000 mg | Freq: Once | INTRAMUSCULAR | Status: AC
  Administered 2019-11-12: 15 mg via INTRAVENOUS
  Filled 2019-11-12: qty 1

## 2019-11-12 MED ORDER — LIDOCAINE HCL (PF) 1 % IJ SOLN
5.0000 mL | Freq: Once | INTRAMUSCULAR | Status: AC
  Administered 2019-11-12: 5 mL
  Filled 2019-11-12: qty 5

## 2019-11-12 NOTE — Discharge Instructions (Addendum)
Take the full course of antibiotics as prescribed.  Return to the emergency room if the redness is expanding, if the infection looks worse, if you have fever, worsening pain, nausea or vomiting.
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
## 2019-11-29 ENCOUNTER — Other Ambulatory Visit: Payer: Self-pay

## 2019-11-29 ENCOUNTER — Emergency Department
Admission: EM | Admit: 2019-11-29 | Discharge: 2019-11-29 | Disposition: A | Discharge status: Home / Self Care | Payer: Medicaid Other | Attending: Emergency Medicine | Admitting: Emergency Medicine

## 2019-11-29 ENCOUNTER — Encounter: Payer: Self-pay | Admitting: Emergency Medicine

## 2019-11-29 DIAGNOSIS — L739 Follicular disorder, unspecified: Secondary | ICD-10-CM | POA: Insufficient documentation

## 2019-11-29 DIAGNOSIS — F1510 Other stimulant abuse, uncomplicated: Secondary | ICD-10-CM | POA: Insufficient documentation

## 2019-11-29 DIAGNOSIS — F17228 Nicotine dependence, chewing tobacco, with other nicotine-induced disorders: Secondary | ICD-10-CM | POA: Insufficient documentation

## 2019-11-29 DIAGNOSIS — F17210 Nicotine dependence, cigarettes, uncomplicated: Secondary | ICD-10-CM | POA: Insufficient documentation

## 2019-11-29 MED ORDER — DIPHENHYDRAMINE HCL 25 MG PO CAPS
25.0000 mg | ORAL_CAPSULE | Freq: Once | ORAL | Status: AC
  Administered 2019-11-29: 23:00:00 25 mg via ORAL
  Filled 2019-11-29: qty 1

## 2019-11-29 MED ORDER — BACITRACIN-NEOMYCIN-POLYMYXIN 400-5-5000 EX OINT: TOPICAL_OINTMENT | Freq: Every day | CUTANEOUS | Status: DC

## 2019-11-29 MED ORDER — MUPIROCIN 2 % EX OINT: TOPICAL_OINTMENT | CUTANEOUS | 0 refills | Status: DC

## 2019-11-29 NOTE — Discharge Instructions (Signed)
Use a new razor. Follow up with the primary care provider for symptoms that change or worsen. Return to the ER if worse and unable to schedule an appointment.
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
## 2019-11-29 NOTE — ED Triage Notes (Signed)
Patient to ER for c/o small abscess to left lower leg.

## 2019-11-29 NOTE — ED Notes (Signed)
Pt to ED for abscess on left leg that appeared this morning. States she had an abscess on left leg that had to be drained in the ED 2 weeks ago as well. C/o left leg being sore today. Redness noted to area on left leg, no heat noted.  C/o lower back pain "in my kidneys" that started within the last week. States pain is worse in the mornings with urination. ' Pt in NAD at this time

## 2020-02-22 ENCOUNTER — Other Ambulatory Visit: Payer: Self-pay

## 2020-02-22 ENCOUNTER — Emergency Department
Admission: EM | Admit: 2020-02-22 | Discharge: 2020-02-22 | Disposition: A | Discharge status: Home / Self Care | Payer: Self-pay | Attending: Emergency Medicine | Admitting: Emergency Medicine

## 2020-02-22 ENCOUNTER — Emergency Department: Payer: Self-pay

## 2020-02-22 DIAGNOSIS — A419 Sepsis, unspecified organism: Secondary | ICD-10-CM | POA: Diagnosis present

## 2020-02-22 DIAGNOSIS — G43909 Migraine, unspecified, not intractable, without status migrainosus: Secondary | ICD-10-CM | POA: Diagnosis present

## 2020-02-22 DIAGNOSIS — R55 Syncope and collapse: Secondary | ICD-10-CM | POA: Insufficient documentation

## 2020-02-22 DIAGNOSIS — Z91410 Personal history of adult physical and sexual abuse: Secondary | ICD-10-CM | POA: Diagnosis present

## 2020-02-22 DIAGNOSIS — F29 Unspecified psychosis not due to a substance or known physiological condition: Secondary | ICD-10-CM | POA: Diagnosis present

## 2020-02-22 DIAGNOSIS — F1021 Alcohol dependence, in remission: Secondary | ICD-10-CM

## 2020-02-22 DIAGNOSIS — F17200 Nicotine dependence, unspecified, uncomplicated: Secondary | ICD-10-CM | POA: Diagnosis present

## 2020-02-22 DIAGNOSIS — F17210 Nicotine dependence, cigarettes, uncomplicated: Secondary | ICD-10-CM | POA: Insufficient documentation

## 2020-02-22 DIAGNOSIS — F1590 Other stimulant use, unspecified, uncomplicated: Secondary | ICD-10-CM | POA: Diagnosis present

## 2020-02-22 DIAGNOSIS — M797 Fibromyalgia: Secondary | ICD-10-CM | POA: Diagnosis present

## 2020-02-22 DIAGNOSIS — Z653 Problems related to other legal circumstances: Secondary | ICD-10-CM

## 2020-02-22 DIAGNOSIS — F209 Schizophrenia, unspecified: Secondary | ICD-10-CM | POA: Insufficient documentation

## 2020-02-22 DIAGNOSIS — E663 Overweight: Secondary | ICD-10-CM | POA: Diagnosis present

## 2020-02-22 DIAGNOSIS — N898 Other specified noninflammatory disorders of vagina: Secondary | ICD-10-CM | POA: Insufficient documentation

## 2020-02-22 DIAGNOSIS — Z62810 Personal history of physical and sexual abuse in childhood: Secondary | ICD-10-CM

## 2020-02-22 DIAGNOSIS — F1910 Other psychoactive substance abuse, uncomplicated: Secondary | ICD-10-CM | POA: Insufficient documentation

## 2020-02-22 DIAGNOSIS — F319 Bipolar disorder, unspecified: Secondary | ICD-10-CM | POA: Diagnosis present

## 2020-02-22 DIAGNOSIS — Z79899 Other long term (current) drug therapy: Secondary | ICD-10-CM | POA: Insufficient documentation

## 2020-02-22 DIAGNOSIS — F4310 Post-traumatic stress disorder, unspecified: Secondary | ICD-10-CM | POA: Diagnosis present

## 2020-02-22 DIAGNOSIS — R519 Headache, unspecified: Secondary | ICD-10-CM | POA: Insufficient documentation

## 2020-02-22 DIAGNOSIS — F32A Depression, unspecified: Secondary | ICD-10-CM | POA: Diagnosis present

## 2020-02-22 DIAGNOSIS — F419 Anxiety disorder, unspecified: Secondary | ICD-10-CM | POA: Diagnosis present

## 2020-02-22 DIAGNOSIS — R1030 Lower abdominal pain, unspecified: Secondary | ICD-10-CM | POA: Insufficient documentation

## 2020-02-22 DIAGNOSIS — F329 Major depressive disorder, single episode, unspecified: Secondary | ICD-10-CM | POA: Diagnosis present

## 2020-02-22 LAB — URINE DRUG SCREEN, QUALITATIVE (ARMC ONLY)
Amphetamines, Ur Screen: NOT DETECTED
Barbiturates, Ur Screen: NOT DETECTED
Benzodiazepine, Ur Scrn: NOT DETECTED
Cannabinoid 50 Ng, Ur ~~LOC~~: NOT DETECTED
Cocaine Metabolite,Ur ~~LOC~~: NOT DETECTED
MDMA (Ecstasy)Ur Screen: NOT DETECTED
Methadone Scn, Ur: NOT DETECTED
Opiate, Ur Screen: NOT DETECTED
Phencyclidine (PCP) Ur S: NOT DETECTED
Tricyclic, Ur Screen: NOT DETECTED

## 2020-02-22 LAB — WET PREP, GENITAL
Clue Cells Wet Prep HPF POC: NONE SEEN
Sperm: NONE SEEN
Trich, Wet Prep: NONE SEEN
Yeast Wet Prep HPF POC: NONE SEEN

## 2020-02-22 LAB — COMPREHENSIVE METABOLIC PANEL
ALT: 12 U/L (ref 0–44)
AST: 18 U/L (ref 15–41)
Albumin: 4.1 g/dL (ref 3.5–5.0)
Alkaline Phosphatase: 53 U/L (ref 38–126)
Anion gap: 8 (ref 5–15)
BUN: <5 mg/dL — ABNORMAL LOW (ref 6–20)
CO2: 27 mmol/L (ref 22–32)
Calcium: 9.3 mg/dL (ref 8.9–10.3)
Chloride: 103 mmol/L (ref 98–111)
Creatinine, Ser: 0.65 mg/dL (ref 0.44–1.00)
GFR calc Af Amer: >60 mL/min (ref >60)
GFR calc non Af Amer: >60 mL/min (ref >60)
Glucose, Bld: 97 mg/dL (ref 70–99)
Potassium: 3.7 mmol/L (ref 3.5–5.1)
Sodium: 138 mmol/L (ref 135–145)
Total Bilirubin: 0.6 mg/dL (ref 0.3–1.2)
Total Protein: 7.4 g/dL (ref 6.5–8.1)

## 2020-02-22 LAB — SALICYLATE LEVEL: Salicylate Lvl: <7 mg/dL — ABNORMAL LOW (ref 7.0–30.0)

## 2020-02-22 LAB — CBC
HCT: 41.6 % (ref 36.0–46.0)
Hemoglobin: 13.8 g/dL (ref 12.0–15.0)
MCH: 29.4 pg (ref 26.0–34.0)
MCHC: 33.2 g/dL (ref 30.0–36.0)
MCV: 88.5 fL (ref 80.0–100.0)
Platelets: 357 10*3/uL (ref 150–400)
RBC: 4.7 MIL/uL (ref 3.87–5.11)
RDW: 11.8 % (ref 11.5–15.5)
WBC: 7.2 10*3/uL (ref 4.0–10.5)
nRBC: 0 % (ref 0.0–0.2)

## 2020-02-22 LAB — ETHANOL: Alcohol, Ethyl (B): <10 mg/dL (ref <10)

## 2020-02-22 LAB — CHLAMYDIA/NGC RT PCR (ARMC ONLY)
Chlamydia Tr: NOT DETECTED
N gonorrhoeae: NOT DETECTED

## 2020-02-22 LAB — ACETAMINOPHEN LEVEL: Acetaminophen (Tylenol), Serum: <10 ug/mL — ABNORMAL LOW (ref 10–30)

## 2020-02-22 LAB — T4, FREE: Free T4: 0.89 ng/dL (ref 0.61–1.12)

## 2020-02-22 LAB — TSH: TSH: 1.538 u[IU]/mL (ref 0.350–4.500)

## 2020-02-22 MED ORDER — ACETAMINOPHEN 500 MG PO TABS
1000.0000 mg | ORAL_TABLET | Freq: Once | ORAL | Status: AC
  Administered 2020-02-22: 1000 mg via ORAL
  Filled 2020-02-22: qty 2

## 2020-02-22 MED ORDER — RISPERIDONE 1 MG PO TABS: 0.5000 mg | ORAL_TABLET | Freq: Two times a day (BID) | ORAL | 1 refills | Status: DC

## 2020-02-22 MED ORDER — IOHEXOL 350 MG/ML SOLN
75.0000 mL | Freq: Once | INTRAVENOUS | Status: AC | PRN
  Administered 2020-02-22: 19:00:00 75 mL via INTRAVENOUS

## 2020-02-22 NOTE — ED Notes (Signed)
Went outside if called.

## 2020-02-22 NOTE — Consult Note (Signed)
Sofie Rower notified of request for SANE consult and circumstances as previously charted.

## 2020-02-22 NOTE — ED Notes (Signed)
Urine preg negative. Unsure why glucometer not linking results to chart.

## 2020-02-22 NOTE — ED Notes (Signed)
Secretary notified request to see SANE nurse made by pt.

## 2020-02-22 NOTE — ED Notes (Signed)
Gave report to SANE nurse.

## 2020-02-22 NOTE — ED Notes (Signed)
Pt left room to see this nurse at nurse's station. Did not use call bell. Pt anxious to leave. Stated watching chart for psych to post their eval note. Pt back to room.
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
## 2020-02-22 NOTE — ED Notes (Signed)
Confirmed with chart RN Carollee Herter that pt does not currently need to be dressed out. Pt given dinner tray and drink.

## 2020-02-22 NOTE — ED Notes (Signed)
Psych staff to bedside.  

## 2020-02-22 NOTE — ED Notes (Signed)
Called CT to notify verbally of preg test as glucometer did not link result to chart as it normally does. Preg NEGATIVE.

## 2020-02-22 NOTE — ED Notes (Signed)
Psych staff verbally updating EDP Williams.

## 2020-02-22 NOTE — ED Notes (Signed)
Pt up to bedside toilet providing urine sample.  

## 2020-02-22 NOTE — ED Provider Notes (Signed)
Patient has been cleared by psychiatry for discharge.   Emily Filbert, MD 02/22/20 [phone]

## 2020-02-22 NOTE — ED Notes (Signed)
Pt anxious to leave. Explained delay. Called psych staff. Reassured pt she is next to be seen per psych.
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
## 2020-02-22 NOTE — Consult Note (Signed)
Called on coming SANE, Sofie Rower, awaiting return call.

## 2020-02-22 NOTE — ED Notes (Signed)
Pelvic cart to bedside. Pt up to bedside toilet.
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
## 2020-02-22 NOTE — ED Notes (Signed)
Pt off unit
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
## 2020-02-22 NOTE — ED Notes (Signed)
SANE  NURSE  CALLED  PER  DR  Fuller Plan MD
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
## 2020-02-24 LAB — POCT PREGNANCY, URINE: Preg Test, Ur: NEGATIVE

## 2020-05-13 ENCOUNTER — Encounter: Payer: Self-pay | Admitting: Emergency Medicine

## 2020-05-13 ENCOUNTER — Other Ambulatory Visit: Payer: Self-pay

## 2020-05-13 ENCOUNTER — Emergency Department
Admission: EM | Admit: 2020-05-13 | Discharge: 2020-05-13 | Disposition: A | Discharge status: Home / Self Care | Payer: Medicaid Other | Attending: Emergency Medicine | Admitting: Emergency Medicine

## 2020-05-13 DIAGNOSIS — F17210 Nicotine dependence, cigarettes, uncomplicated: Secondary | ICD-10-CM | POA: Insufficient documentation

## 2020-05-13 DIAGNOSIS — L03115 Cellulitis of right lower limb: Secondary | ICD-10-CM | POA: Insufficient documentation

## 2020-05-13 MED ORDER — SULFAMETHOXAZOLE-TRIMETHOPRIM 800-160 MG PO TABS
1.0000 | ORAL_TABLET | Freq: Once | ORAL | Status: AC
  Administered 2020-05-13: 1 via ORAL
  Filled 2020-05-13: qty 1

## 2020-05-13 MED ORDER — LIDOCAINE HCL (PF) 1 % IJ SOLN: 5.0000 mL | Freq: Once | INTRAMUSCULAR | Status: DC

## 2020-05-13 MED ORDER — SULFAMETHOXAZOLE-TRIMETHOPRIM 800-160 MG PO TABS: 1.0000 | ORAL_TABLET | Freq: Two times a day (BID) | ORAL | 0 refills | Status: DC

## 2020-05-13 MED ORDER — TRAMADOL HCL 50 MG PO TABS
50.0000 mg | ORAL_TABLET | Freq: Once | ORAL | Status: AC
  Administered 2020-05-13: 50 mg via ORAL
  Filled 2020-05-13: qty 1

## 2020-05-13 NOTE — ED Triage Notes (Signed)
Patient presents to the ED with an abscess to the back of her right leg.  Patient states, "I think it's MRSA".  Patient states area is draining.  Patient reports similar infections in the past.  Patient reports pain has been increasing since she noticed the area on Wednesday.

## 2020-05-13 NOTE — ED Notes (Signed)
See triage note  Presents with redness and swelling to posterior right lower leg  Possible insect bite  Area started to drain couple of days ago

## 2020-05-13 NOTE — Discharge Instructions (Addendum)
Keep the wound clean, dry, and covered. Take all of the antibiotics until every pill is gone. Follow-up with [facility] or return if needed.
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
## 2021-04-22 IMAGING — MR MR HIP*L* W/O CM
5 series · 37 of 40 positions shown · non-contrast
Comparison: None.

CLINICAL DATA: Fever and left hip pain in a patient with a history
of polysubstance abuse. No known injury.

EXAM:
MR OF THE LEFT HIP WITHOUT CONTRAST
TECHNIQUE: Multiplanar, multisequence MR imaging was performed. No intravenous
contrast was administered.

[Series 8: T1 · coronal · left · 4.0mm · 0.85mm/px · 8 of 40 slices shown]
[im 1/40]
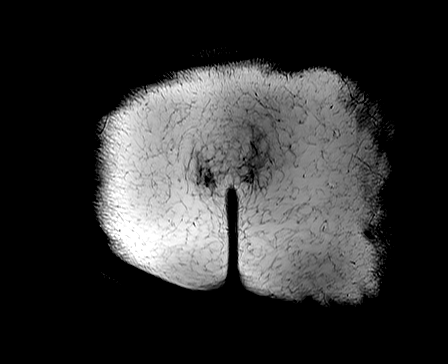
[im 5/40]
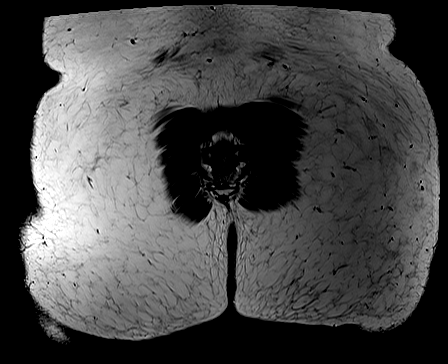
[im 10/40]
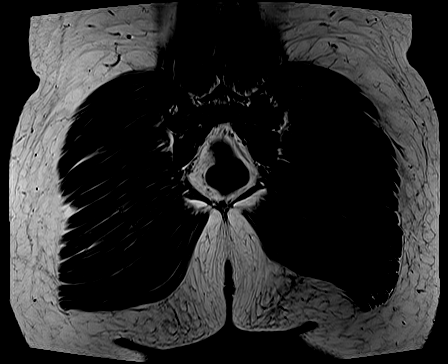
[im 15/40]
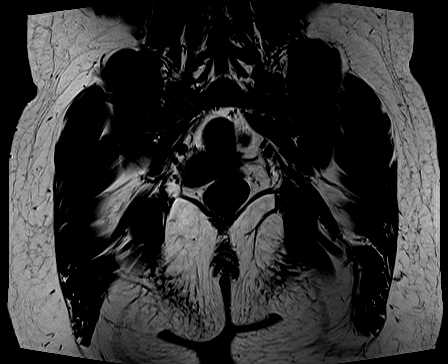
[im 25/40]
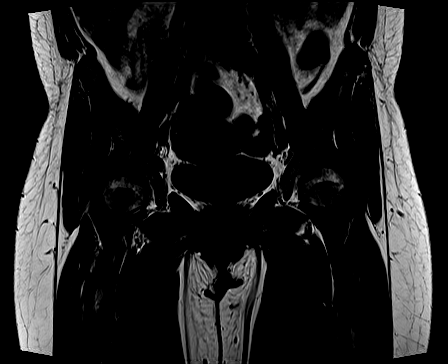
[im 30/40]
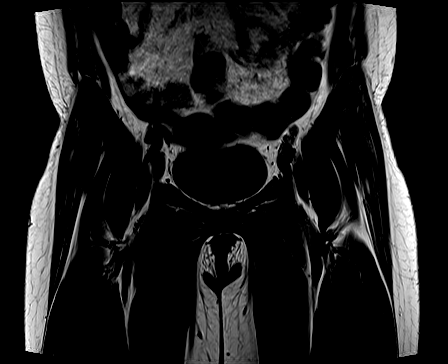
[im 35/40]
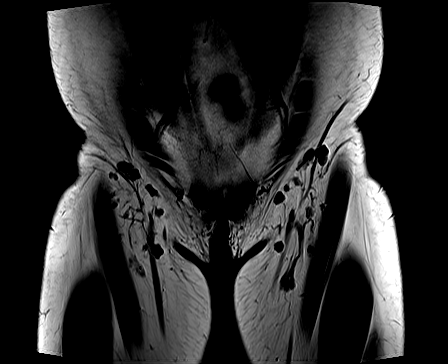
[im 40/40]
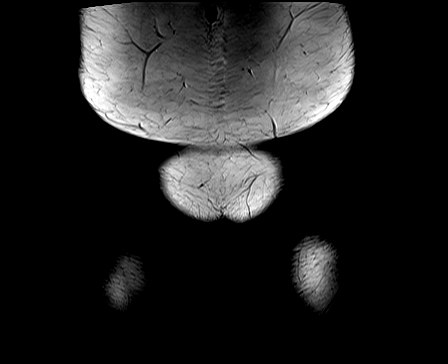

[Series 9: T2 fat-sat · coronal · left · 4.0mm · 1.03mm/px · 8 of 40 slices shown (1 of 2)]
[im 1/40]
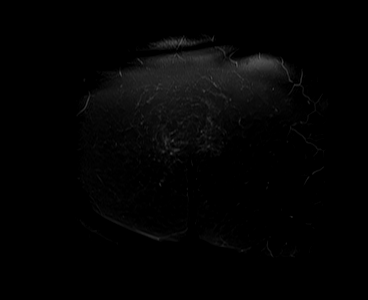
[im 5/40]
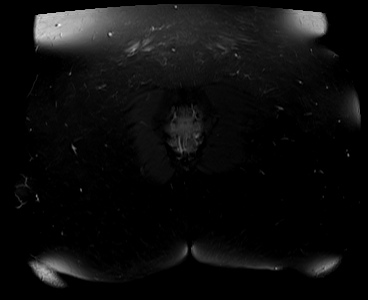
[im 14/40]
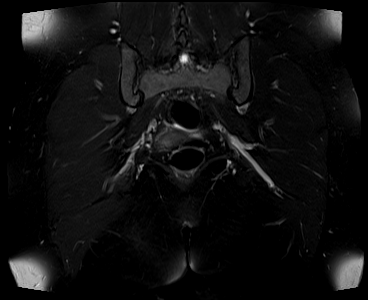
[im 18/40]
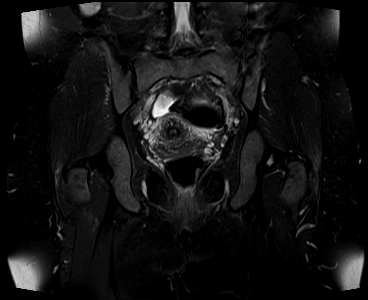
[im 22/40]
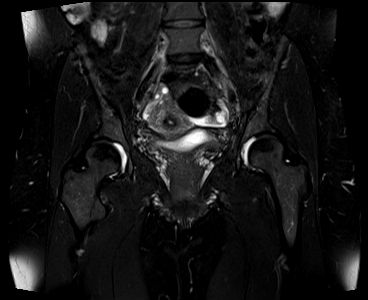
[im 27/40]
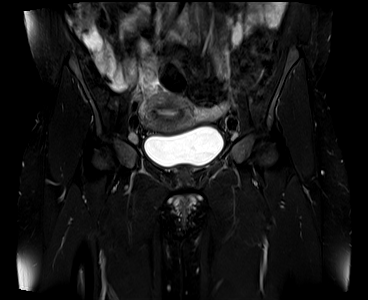
[im 35/40]
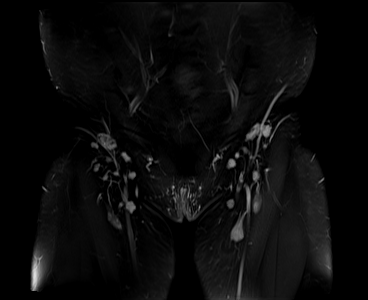
[im 40/40]
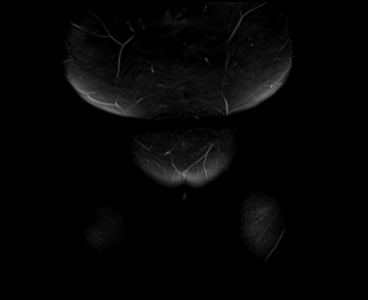

[Series 10: T2 fat-sat · axial · left · 4.0mm · 0.35mm/px · z∈[-93,+52]mm · 7 of 30 slices shown (2 of 2)]
[im 1/30]
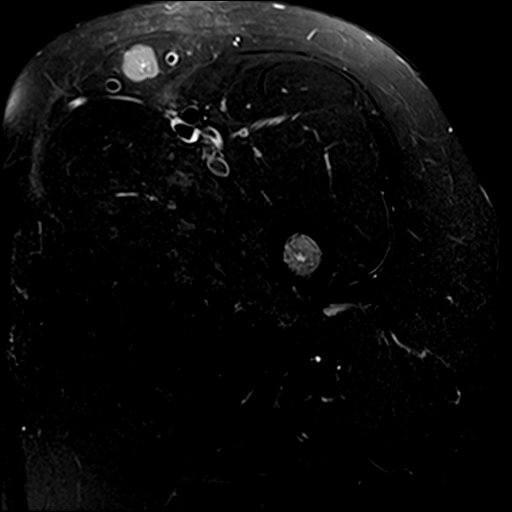
[im 5/30]
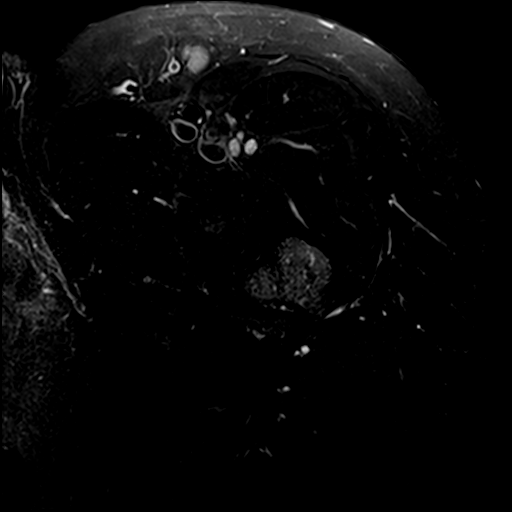
[im 10/30]
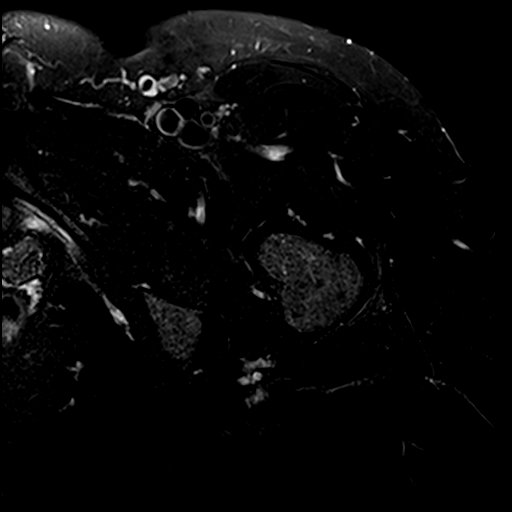
[im 15/30]
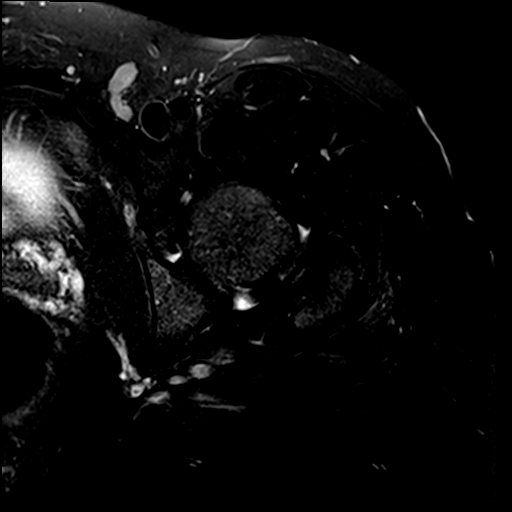
[im 20/30]
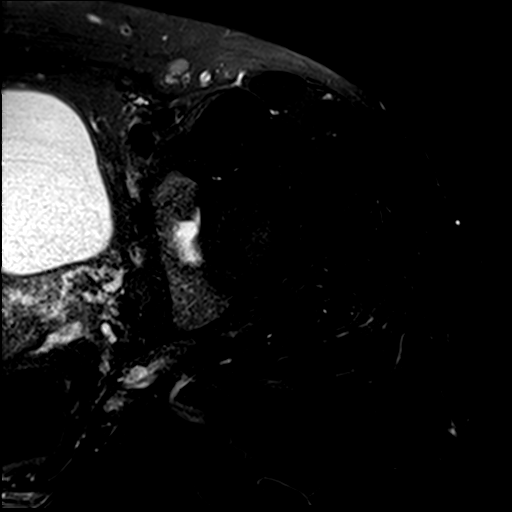
[im 25/30]
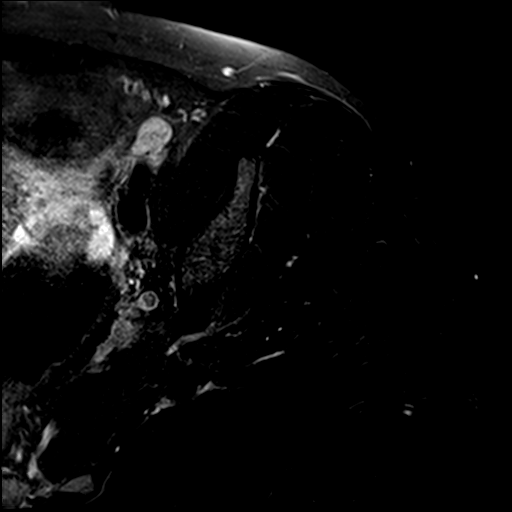
[im 30/30]
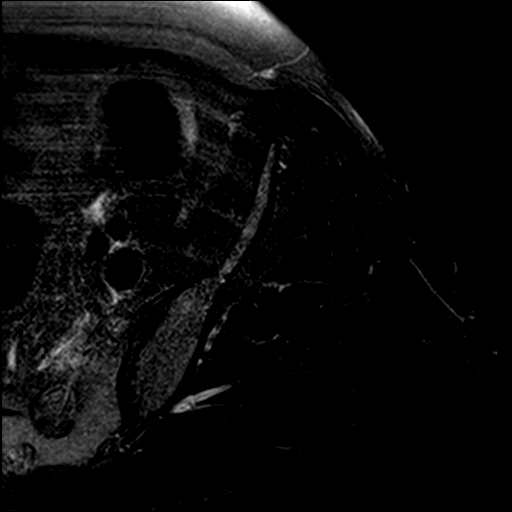

[Series 11: PD fat-sat · sagittal · left · 4.0mm · 0.70mm/px · 7 of 29 slices shown (1 of 2)]
[im 1/29]
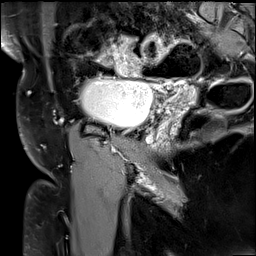
[im 5/29]
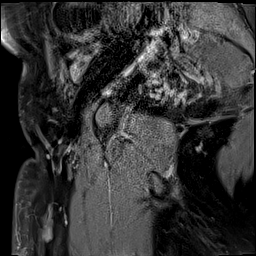
[im 10/29]
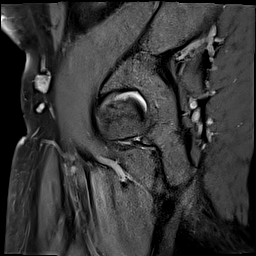
[im 15/29]
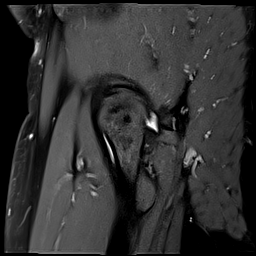
[im 19/29]
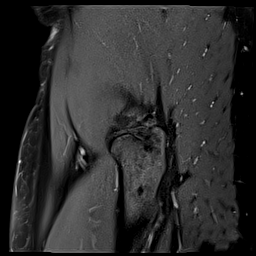
[im 24/29]
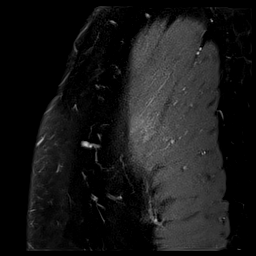
[im 29/29]
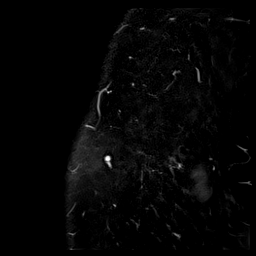

[Series 12: PD fat-sat · coronal · left · 4.0mm · 0.70mm/px · 7 of 29 slices shown (2 of 2)]
[im 1/29]
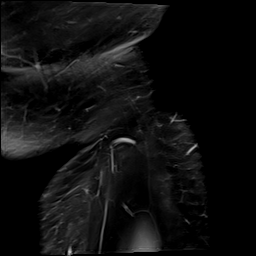
[im 5/29]
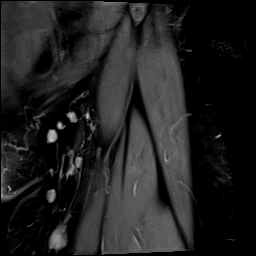
[im 10/29]
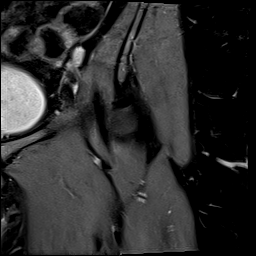
[im 15/29]
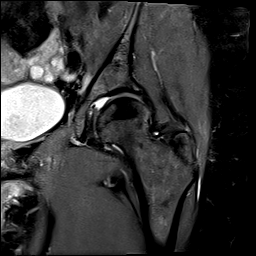
[im 19/29]
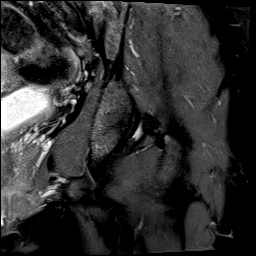
[im 24/29]
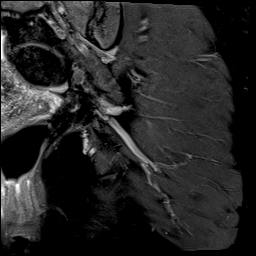
[im 29/29]
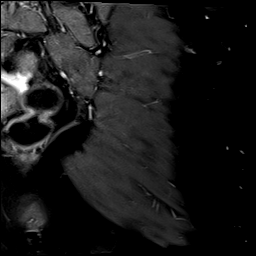

[37 of 40 positions shown; findings below may reference images not displayed]

FINDINGS: Bones: Marrow signal is normal throughout without evidence of
osteomyelitis, fracture, stress change or focal lesion. No avascular
necrosis of the femoral heads.

Articular cartilage and labrum

Articular cartilage:  Normal.

Labrum:  Intact.

Joint or bursal effusion

Joint effusion:  None.

Bursae: Negative.

Muscles and tendons

Muscles and tendons:  Intact and normal in appearance.

Other findings

Miscellaneous: Small bilateral external iliac and right groin are
noted.
IMPRESSION: Negative for septic joint or osteomyelitis. No acute or focal
abnormality.

Small bilateral external iliac and right groin lymph nodes are
likely incidental rather than reactive.

## 2022-01-13 ENCOUNTER — Encounter: Payer: Self-pay | Admitting: Emergency Medicine

## 2022-01-13 ENCOUNTER — Emergency Department
Admission: EM | Admit: 2022-01-13 | Discharge: 2022-01-14 | Disposition: A | Discharge status: Home / Self Care | Payer: Self-pay | Attending: Emergency Medicine | Admitting: Emergency Medicine

## 2022-01-13 ENCOUNTER — Emergency Department: Payer: Medicaid Other

## 2022-01-13 DIAGNOSIS — F29 Unspecified psychosis not due to a substance or known physiological condition: Secondary | ICD-10-CM | POA: Diagnosis present

## 2022-01-13 DIAGNOSIS — M797 Fibromyalgia: Secondary | ICD-10-CM | POA: Diagnosis present

## 2022-01-13 DIAGNOSIS — R079 Chest pain, unspecified: Secondary | ICD-10-CM | POA: Insufficient documentation

## 2022-01-13 DIAGNOSIS — F319 Bipolar disorder, unspecified: Secondary | ICD-10-CM | POA: Diagnosis present

## 2022-01-13 DIAGNOSIS — F32A Depression, unspecified: Secondary | ICD-10-CM | POA: Diagnosis present

## 2022-01-13 DIAGNOSIS — Z5900 Homelessness unspecified: Secondary | ICD-10-CM | POA: Insufficient documentation

## 2022-01-13 DIAGNOSIS — R300 Dysuria: Secondary | ICD-10-CM | POA: Insufficient documentation

## 2022-01-13 DIAGNOSIS — Z62810 Personal history of physical and sexual abuse in childhood: Secondary | ICD-10-CM

## 2022-01-13 DIAGNOSIS — G43909 Migraine, unspecified, not intractable, without status migrainosus: Secondary | ICD-10-CM | POA: Diagnosis present

## 2022-01-13 DIAGNOSIS — Z653 Problems related to other legal circumstances: Secondary | ICD-10-CM

## 2022-01-13 DIAGNOSIS — F17200 Nicotine dependence, unspecified, uncomplicated: Secondary | ICD-10-CM | POA: Diagnosis present

## 2022-01-13 DIAGNOSIS — F4310 Post-traumatic stress disorder, unspecified: Secondary | ICD-10-CM | POA: Diagnosis present

## 2022-01-13 DIAGNOSIS — Z91410 Personal history of adult physical and sexual abuse: Secondary | ICD-10-CM | POA: Diagnosis present

## 2022-01-13 DIAGNOSIS — J029 Acute pharyngitis, unspecified: Secondary | ICD-10-CM | POA: Insufficient documentation

## 2022-01-13 DIAGNOSIS — R0981 Nasal congestion: Secondary | ICD-10-CM | POA: Insufficient documentation
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
## 2022-01-14 DIAGNOSIS — F319 Bipolar disorder, unspecified: Secondary | ICD-10-CM

## 2022-01-14 LAB — SALICYLATE LEVEL: Salicylate Lvl: <7 mg/dL — ABNORMAL LOW (ref 7.0–30.0)

## 2022-01-14 LAB — CBC WITH DIFFERENTIAL/PLATELET
Abs Immature Granulocytes: 0.02 10*3/uL (ref 0.00–0.07)
Basophils Absolute: 0.1 10*3/uL (ref 0.0–0.1)
Basophils Relative: 1 %
Eosinophils Absolute: 0.4 10*3/uL (ref 0.0–0.5)
Eosinophils Relative: 5 %
HCT: 44 % (ref 36.0–46.0)
Hemoglobin: 14.4 g/dL (ref 12.0–15.0)
Immature Granulocytes: 0 %
Lymphocytes Relative: 33 %
Lymphs Abs: 2.8 10*3/uL (ref 0.7–4.0)
MCH: 28.1 pg (ref 26.0–34.0)
MCHC: 32.7 g/dL (ref 30.0–36.0)
MCV: 85.8 fL (ref 80.0–100.0)
Monocytes Absolute: 0.6 10*3/uL (ref 0.1–1.0)
Monocytes Relative: 8 %
Neutro Abs: 4.6 10*3/uL (ref 1.7–7.7)
Neutrophils Relative %: 53 %
Platelets: 467 10*3/uL — ABNORMAL HIGH (ref 150–400)
RBC: 5.13 MIL/uL — ABNORMAL HIGH (ref 3.87–5.11)
RDW: 12.5 % (ref 11.5–15.5)
WBC: 8.6 10*3/uL (ref 4.0–10.5)
nRBC: 0 % (ref 0.0–0.2)

## 2022-01-14 LAB — COMPREHENSIVE METABOLIC PANEL
ALT: 21 U/L (ref 0–44)
AST: 23 U/L (ref 15–41)
Albumin: 4.1 g/dL (ref 3.5–5.0)
Alkaline Phosphatase: 75 U/L (ref 38–126)
Anion gap: 8 (ref 5–15)
BUN: 8 mg/dL (ref 6–20)
CO2: 27 mmol/L (ref 22–32)
Calcium: 9.3 mg/dL (ref 8.9–10.3)
Chloride: 104 mmol/L (ref 98–111)
Creatinine, Ser: 0.46 mg/dL (ref 0.44–1.00)
GFR, Estimated: >60 mL/min (ref >60)
Glucose, Bld: 101 mg/dL — ABNORMAL HIGH (ref 70–99)
Potassium: 3.2 mmol/L — ABNORMAL LOW (ref 3.5–5.1)
Sodium: 139 mmol/L (ref 135–145)
Total Bilirubin: 0.6 mg/dL (ref 0.3–1.2)
Total Protein: 8.1 g/dL (ref 6.5–8.1)

## 2022-01-14 LAB — ETHANOL: Alcohol, Ethyl (B): <10 mg/dL (ref <10)

## 2022-01-14 LAB — TROPONIN I (HIGH SENSITIVITY): Troponin I (High Sensitivity): 3 ng/L (ref <18)

## 2022-01-14 LAB — ACETAMINOPHEN LEVEL: Acetaminophen (Tylenol), Serum: <10 ug/mL — ABNORMAL LOW (ref 10–30)

## 2022-01-14 MED ORDER — ACETAMINOPHEN 500 MG PO TABS: 1000.0000 mg | ORAL_TABLET | Freq: Once | ORAL | Status: DC
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
## 2022-01-14 NOTE — ED Notes (Signed)
Patient refused writer to go over discharge paperwork or to take discharge paperwork with her when she left.  Patient escorted to door while cursing at staff.  ?
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
## 2023-02-03 ENCOUNTER — Ambulatory Visit: Payer: Medicaid Other

## 2023-03-04 ENCOUNTER — Ambulatory Visit: Payer: Medicaid Other | Admitting: Advanced Practice Midwife

## 2023-03-04 ENCOUNTER — Encounter: Payer: Self-pay | Admitting: Advanced Practice Midwife

## 2023-03-04 DIAGNOSIS — Z113 Encounter for screening for infections with a predominantly sexual mode of transmission: Secondary | ICD-10-CM | POA: Diagnosis not present

## 2023-03-04 LAB — HEPATITIS B SURFACE ANTIGEN

## 2023-03-04 LAB — WET PREP FOR TRICH, YEAST, CLUE
Trichomonas Exam: NEGATIVE
Yeast Exam: NEGATIVE

## 2023-03-04 LAB — HM HEPATITIS C SCREENING LAB: HM Hepatitis Screen: NEGATIVE

## 2023-03-04 LAB — HM HIV SCREENING LAB: HM HIV Screening: NEGATIVE

## 2023-03-04 NOTE — Progress Notes (Signed)
Wet prep reviewed - negative results. ROI for STI testing results / treatment records signed and faxed to [facility] Department with fax confirmation received. Jossie Ng, RN
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
## 2023-03-08 LAB — GONOCOCCUS CULTURE

## 2023-04-17 ENCOUNTER — Encounter: Payer: Self-pay | Admitting: Emergency Medicine

## 2023-04-17 ENCOUNTER — Emergency Department: Payer: Medicaid Other

## 2023-04-17 DIAGNOSIS — F1490 Cocaine use, unspecified, uncomplicated: Secondary | ICD-10-CM | POA: Insufficient documentation

## 2023-04-17 DIAGNOSIS — R0789 Other chest pain: Secondary | ICD-10-CM | POA: Diagnosis not present

## 2023-04-17 LAB — CBC
HCT: 40.1 % (ref 36.0–46.0)
Hemoglobin: 12.7 g/dL (ref 12.0–15.0)
MCH: 25.6 pg — ABNORMAL LOW (ref 26.0–34.0)
MCHC: 31.7 g/dL (ref 30.0–36.0)
MCV: 80.8 fL (ref 80.0–100.0)
Platelets: 481 10*3/uL — ABNORMAL HIGH (ref 150–400)
RBC: 4.96 MIL/uL (ref 3.87–5.11)
RDW: 14.5 % (ref 11.5–15.5)
WBC: 7.3 10*3/uL (ref 4.0–10.5)
nRBC: 0 % (ref 0.0–0.2)
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
## 2023-04-18 ENCOUNTER — Emergency Department
Admission: EM | Admit: 2023-04-18 | Discharge: 2023-04-18 | Disposition: A | Discharge status: Home / Self Care | Payer: Medicaid Other | Attending: Emergency Medicine | Admitting: Emergency Medicine

## 2023-04-18 DIAGNOSIS — R0789 Other chest pain: Secondary | ICD-10-CM

## 2023-04-18 DIAGNOSIS — F1490 Cocaine use, unspecified, uncomplicated: Secondary | ICD-10-CM

## 2023-04-18 LAB — POC URINE PREG, ED: Preg Test, Ur: NEGATIVE

## 2023-04-18 LAB — URINE DRUG SCREEN, QUALITATIVE (ARMC ONLY)
Amphetamines, Ur Screen: POSITIVE — AB
Barbiturates, Ur Screen: NOT DETECTED
Benzodiazepine, Ur Scrn: NOT DETECTED
Cannabinoid 50 Ng, Ur ~~LOC~~: NOT DETECTED
Cocaine Metabolite,Ur ~~LOC~~: POSITIVE — AB
MDMA (Ecstasy)Ur Screen: NOT DETECTED
Methadone Scn, Ur: NOT DETECTED
Opiate, Ur Screen: NOT DETECTED
Phencyclidine (PCP) Ur S: NOT DETECTED
Tricyclic, Ur Screen: NOT DETECTED

## 2023-04-18 LAB — URINALYSIS, ROUTINE W REFLEX MICROSCOPIC
Bacteria, UA: NONE SEEN
Bilirubin Urine: NEGATIVE
Glucose, UA: NEGATIVE mg/dL
Hgb urine dipstick: NEGATIVE
Ketones, ur: 5 mg/dL — AB
Nitrite: NEGATIVE
Protein, ur: 30 mg/dL — AB
Specific Gravity, Urine: 1.029 (ref 1.005–1.030)
pH: 5 (ref 5.0–8.0)

## 2023-04-18 LAB — BASIC METABOLIC PANEL
Anion gap: 9 (ref 5–15)
BUN: 9 mg/dL (ref 6–20)
CO2: 22 mmol/L (ref 22–32)
Calcium: 8.5 mg/dL — ABNORMAL LOW (ref 8.9–10.3)
Chloride: 106 mmol/L (ref 98–111)
Creatinine, Ser: 0.64 mg/dL (ref 0.44–1.00)
GFR, Estimated: >60 mL/min (ref >60)
Glucose, Bld: 112 mg/dL — ABNORMAL HIGH (ref 70–99)
Potassium: 3.1 mmol/L — ABNORMAL LOW (ref 3.5–5.1)
Sodium: 137 mmol/L (ref 135–145)

## 2023-04-18 LAB — TROPONIN I (HIGH SENSITIVITY)
Troponin I (High Sensitivity): <2 ng/L (ref <18)
Troponin I (High Sensitivity): <2 ng/L (ref <18)

## 2023-04-18 MED ORDER — LACTATED RINGERS IV BOLUS
1000.0000 mL | Freq: Once | INTRAVENOUS | Status: AC
  Administered 2023-04-18: 1000 mL via INTRAVENOUS

## 2023-04-18 MED ORDER — POTASSIUM CHLORIDE CRYS ER 20 MEQ PO TBCR
40.0000 meq | EXTENDED_RELEASE_TABLET | Freq: Once | ORAL | Status: AC
  Administered 2023-04-18: 40 meq via ORAL
  Filled 2023-04-18: qty 2

## 2023-04-18 MED ORDER — LORAZEPAM 2 MG/ML IJ SOLN
1.0000 mg | Freq: Once | INTRAMUSCULAR | Status: AC
  Administered 2023-04-18: 1 mg via INTRAVENOUS
  Filled 2023-04-18: qty 1
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
## 2023-04-18 NOTE — Discharge Instructions (Signed)
Stop using crack. Return with worsening chest pain.
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
## 2023-06-29 ENCOUNTER — Ambulatory Visit: Payer: MEDICAID

## 2023-07-07 ENCOUNTER — Emergency Department
Admission: EM | Admit: 2023-07-07 | Discharge: 2023-07-08 | Disposition: A | Discharge status: Home / Self Care | Payer: MEDICAID | Attending: Emergency Medicine | Admitting: Emergency Medicine

## 2023-07-07 ENCOUNTER — Other Ambulatory Visit: Payer: Self-pay

## 2023-07-07 ENCOUNTER — Encounter: Payer: Self-pay | Admitting: *Deleted

## 2023-07-07 DIAGNOSIS — F1590 Other stimulant use, unspecified, uncomplicated: Secondary | ICD-10-CM | POA: Diagnosis present

## 2023-07-07 DIAGNOSIS — F1910 Other psychoactive substance abuse, uncomplicated: Secondary | ICD-10-CM | POA: Diagnosis not present

## 2023-07-07 DIAGNOSIS — F209 Schizophrenia, unspecified: Secondary | ICD-10-CM | POA: Diagnosis not present

## 2023-07-07 DIAGNOSIS — R4689 Other symptoms and signs involving appearance and behavior: Secondary | ICD-10-CM | POA: Diagnosis present

## 2023-07-07 DIAGNOSIS — F17210 Nicotine dependence, cigarettes, uncomplicated: Secondary | ICD-10-CM | POA: Diagnosis not present

## 2023-07-07 DIAGNOSIS — F1490 Cocaine use, unspecified, uncomplicated: Secondary | ICD-10-CM | POA: Diagnosis not present

## 2023-07-07 DIAGNOSIS — F1520 Other stimulant dependence, uncomplicated: Secondary | ICD-10-CM | POA: Insufficient documentation

## 2023-07-07 DIAGNOSIS — F22 Delusional disorders: Secondary | ICD-10-CM | POA: Diagnosis not present

## 2023-07-07 DIAGNOSIS — F600 Paranoid personality disorder: Secondary | ICD-10-CM | POA: Insufficient documentation

## 2023-07-07 DIAGNOSIS — R45851 Suicidal ideations: Secondary | ICD-10-CM

## 2023-07-07 DIAGNOSIS — F1410 Cocaine abuse, uncomplicated: Secondary | ICD-10-CM | POA: Insufficient documentation

## 2023-07-07 DIAGNOSIS — F29 Unspecified psychosis not due to a substance or known physiological condition: Secondary | ICD-10-CM | POA: Diagnosis present

## 2023-07-07 DIAGNOSIS — Z91410 Personal history of adult physical and sexual abuse: Secondary | ICD-10-CM | POA: Diagnosis present

## 2023-07-07 LAB — COMPREHENSIVE METABOLIC PANEL
ALT: 13 U/L (ref 0–44)
AST: 17 U/L (ref 15–41)
Albumin: 4.3 g/dL (ref 3.5–5.0)
Alkaline Phosphatase: 76 U/L (ref 38–126)
Anion gap: 10 (ref 5–15)
BUN: 10 mg/dL (ref 6–20)
CO2: 24 mmol/L (ref 22–32)
Calcium: 9.2 mg/dL (ref 8.9–10.3)
Chloride: 106 mmol/L (ref 98–111)
Creatinine, Ser: 0.74 mg/dL (ref 0.44–1.00)
GFR, Estimated: >60 mL/min (ref >60)
Glucose, Bld: 106 mg/dL — ABNORMAL HIGH (ref 70–99)
Potassium: 3.5 mmol/L (ref 3.5–5.1)
Sodium: 140 mmol/L (ref 135–145)
Total Bilirubin: 0.5 mg/dL (ref 0.3–1.2)
Total Protein: 7.9 g/dL (ref 6.5–8.1)

## 2023-07-07 LAB — CBC
HCT: 38 % (ref 36.0–46.0)
Hemoglobin: 12.1 g/dL (ref 12.0–15.0)
MCH: 25.3 pg — ABNORMAL LOW (ref 26.0–34.0)
MCHC: 31.8 g/dL (ref 30.0–36.0)
MCV: 79.5 fL — ABNORMAL LOW (ref 80.0–100.0)
Platelets: 467 10*3/uL — ABNORMAL HIGH (ref 150–400)
RBC: 4.78 MIL/uL (ref 3.87–5.11)
RDW: 14.3 % (ref 11.5–15.5)
WBC: 9.2 10*3/uL (ref 4.0–10.5)
nRBC: 0 % (ref 0.0–0.2)

## 2023-07-07 LAB — URINE DRUG SCREEN, QUALITATIVE (ARMC ONLY)
Amphetamines, Ur Screen: POSITIVE — AB
Barbiturates, Ur Screen: NOT DETECTED
Benzodiazepine, Ur Scrn: NOT DETECTED
Cannabinoid 50 Ng, Ur ~~LOC~~: NOT DETECTED
Cocaine Metabolite,Ur ~~LOC~~: POSITIVE — AB
MDMA (Ecstasy)Ur Screen: NOT DETECTED
Methadone Scn, Ur: NOT DETECTED
Opiate, Ur Screen: NOT DETECTED
Phencyclidine (PCP) Ur S: NOT DETECTED
Tricyclic, Ur Screen: NOT DETECTED

## 2023-07-07 LAB — ETHANOL: Alcohol, Ethyl (B): <10 mg/dL (ref <10)

## 2023-07-07 LAB — ACETAMINOPHEN LEVEL: Acetaminophen (Tylenol), Serum: <10 ug/mL — ABNORMAL LOW (ref 10–30)

## 2023-07-07 LAB — SALICYLATE LEVEL: Salicylate Lvl: <7 mg/dL — ABNORMAL LOW (ref 7.0–30.0)

## 2023-07-07 MED ORDER — LORAZEPAM 1 MG PO TABS
1.0000 mg | ORAL_TABLET | Freq: Once | ORAL | Status: AC
  Administered 2023-07-07: 1 mg via ORAL
  Filled 2023-07-07: qty 1

## 2023-07-07 MED ORDER — RISPERIDONE 1 MG PO TABS
1.0000 mg | ORAL_TABLET | Freq: Two times a day (BID) | ORAL | Status: DC
  Administered 2023-07-07 – 2023-07-08 (×2): 1 mg via ORAL
  Filled 2023-07-07 (×2): qty 1

## 2023-07-07 NOTE — ED Notes (Signed)
POC was negative 
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
## 2023-07-07 NOTE — ED Notes (Signed)
Blue tee shirt Jeans Wallace Cullens sneakers [employer] pocketbook Media planner [employer]
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
## 2023-07-07 NOTE — ED Notes (Signed)
Psych NP recommends reassess in the AM.

## 2023-07-07 NOTE — ED Notes (Signed)
Poct pregnancy Negative 

## 2023-07-07 NOTE — ED Provider Notes (Signed)
[facility] Provider Note    Event Date/Time   First MD Initiated Contact with Patient 07/07/23 1703     (approximate)   History   Psychiatric Evaluation   HPI  Shannon Patel is a 42 y.o. female  with h/o polysubstance abuse here with hallucinations, SI. Pt reports that she has been "hearing things" and "voices" over the past 1.5 months, and has been worried about her daughter. She has been using more drugs than usual and also reports worsening mood, thoughts people are after her, feeling unsafe, and having thoughts about harming herself. Reports she has had thoughts about killing herself. No alleviating factors. Does not see a psychiatrist regularly.       Physical Exam   Triage Vital Signs: ED Triage Vitals [07/07/23 1644]  Encounter Vitals Group     BP (!) 138/111     Systolic BP Percentile      Diastolic BP Percentile      Pulse Rate (!) 115     Resp 20     Temp 98.2 F (36.8 C)     Temp Source Oral     SpO2 99 %     Weight 155 lb (70.3 kg)     Height 5\' 3"  (1.6 m)     Head Circumference      Peak Flow      Pain Score 0     Pain Loc      Pain Education      Exclude from Growth Chart     Most recent vital signs: Vitals:   07/07/23 1644  BP: (!) 138/111  Pulse: (!) 115  Resp: 20  Temp: 98.2 F (36.8 C)  SpO2: 99%     General: Awake, no distress.  CV:  Good peripheral perfusion.  Resp:  Normal work of breathing. Lungs clear. Abd:  No distention. No tenderness. Other:  +AH, SI. Paranoia.   ED Results / Procedures / Treatments   Labs (all labs ordered are listed, but only abnormal results are displayed) Labs Reviewed  COMPREHENSIVE METABOLIC PANEL - Abnormal; Notable for the following components:      Result Value   Glucose, Bld 106 (*)    All other components within normal limits  SALICYLATE LEVEL - Abnormal; Notable for the following components:   Salicylate Lvl <7.0 (*)    All other components within normal limits   ACETAMINOPHEN LEVEL - Abnormal; Notable for the following components:   Acetaminophen (Tylenol), Serum <10 (*)    All other components within normal limits  CBC - Abnormal; Notable for the following components:   MCV 79.5 (*)    MCH 25.3 (*)    Platelets 467 (*)    All other components within normal limits  URINE DRUG SCREEN, QUALITATIVE ([facility] ONLY) - Abnormal; Notable for the following components:   Amphetamines, Ur Screen POSITIVE (*)    Cocaine Metabolite,Ur [place] POSITIVE (*)    All other components within normal limits  ETHANOL  POC URINE PREG, ED     EKG    RADIOLOGY    I also independently reviewed and agree with radiologist interpretations.   PROCEDURES:  Critical Care performed: No   MEDICATIONS ORDERED IN ED: Medications - No data to display   IMPRESSION / MDM / ASSESSMENT AND PLAN / ED COURSE  I reviewed the triage vital signs and the nursing notes.  Differential diagnosis includes, but is not limited to, polysubstance abuse, adjustment d/o, poor social situation, secondary gain, mania  Patient's presentation is most consistent with acute presentation with potential threat to life or bodily function.  The patient is on the cardiac monitor to evaluate for evidence of arrhythmia and/or significant heart rate changes  42 yo  F here with substance induced vs primary mood do, with paranoia, hallucinations and passive SI. Given her ongoing substance use and erratic history/behavior, c/f mania - IVC placed. UDS+ amphetamines and cocaine. CBC unremarkable. CMP normal. EtOH negative. Pt stable for psych disposition.    FINAL CLINICAL IMPRESSION(S) / ED DIAGNOSES   Final diagnoses:  Paranoia (HCC)  Polysubstance abuse (HCC)  Suicidal ideation     Rx / DC Orders   ED Discharge Orders     None        Note:  This document was prepared using Dragon voice recognition software and may include unintentional dictation errors.    Shaune Pollack, MD 07/07/23 [phone]
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
## 2023-07-07 NOTE — ED Notes (Signed)
TTS and psych assessment complete.

## 2023-07-07 NOTE — ED Notes (Signed)
  Received a phone call from patients ex husband Minelly Carol, who was requesting an update and wanting to speak with patient.  Spoke with patient and she stated she did not want to speak with him at this time but stated it was ok to give him an update on her current status.    Mr Bord stated that he spoke with her earlier today while at work and she was going to hang out with people that "do bad things and drugs".  He was concerned she may have done some illegal drugs.

## 2023-07-07 NOTE — ED Notes (Signed)
Snack fully consumed. Pt made aware no other food would be provided until breakfast in the morning on day shift.   Pt currently laying in recliner.

## 2023-07-07 NOTE — ED Notes (Signed)
VS obtained. Snack provided to pt.
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
## 2023-07-08 DIAGNOSIS — F1590 Other stimulant use, unspecified, uncomplicated: Secondary | ICD-10-CM

## 2023-07-08 NOTE — ED Provider Notes (Signed)
-----------------------------------------   10:56 AM on 07/08/2023 -----------------------------------------  I consulted and discussed case with NP Willeen Cass from psychiatry who has evaluated the patient.  She has rescinded the IVC and cleared her for discharge.  The patient is stable for discharge at this time.  Return precautions have been provided.   Dionne Bucy, MD 07/08/23 1056
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
## 2023-07-08 NOTE — ED Notes (Signed)
Pt. Moved to bhu #1

## 2023-07-08 NOTE — BH Assessment (Signed)
Information for Detox and SA Treatment were provided to the patient.   ______________________________________________ Detox & SA Treatment Facilities  [facility] [address], [place] [phone]  [facility] [address] [place], [place], [place] [phone]  [facility] [address]  [place], [place] [address] [phone]   [facility] [address]  [place], [place] [address] [phone]  [facility] [address],  [place], [place] [address] [phone]  [facility] [address],  [place], [place] [address] [phone]

## 2023-07-08 NOTE — ED Notes (Signed)
NP is reassessing Patient, Patient is calm and cooperative.
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
## 2023-07-08 NOTE — ED Notes (Signed)
Nurse talked with the Patient and she was teary eyed, states " I'm just tired of being used, and nurse ask if she had any support from family or friends and she states " not family, but friends, and she states she is staying away from her ex-husband , she denies Si/hi or avh. Patient is aware that she will be discharged today.

## 2023-12-13 ENCOUNTER — Ambulatory Visit: Payer: MEDICAID

## 2024-03-03 ENCOUNTER — Encounter: Payer: Self-pay | Admitting: Emergency Medicine

## 2024-03-03 ENCOUNTER — Emergency Department
Admission: EM | Admit: 2024-03-03 | Discharge: 2024-03-04 | Disposition: A | Discharge status: Home / Self Care | Payer: MEDICAID | Attending: Emergency Medicine | Admitting: Emergency Medicine

## 2024-03-03 ENCOUNTER — Other Ambulatory Visit: Payer: Self-pay

## 2024-03-03 DIAGNOSIS — F1490 Cocaine use, unspecified, uncomplicated: Secondary | ICD-10-CM | POA: Diagnosis present

## 2024-03-03 DIAGNOSIS — F22 Delusional disorders: Secondary | ICD-10-CM | POA: Diagnosis present

## 2024-03-03 DIAGNOSIS — Y900 Blood alcohol level of less than 20 mg/100 ml: Secondary | ICD-10-CM | POA: Diagnosis not present

## 2024-03-03 LAB — CBC
HCT: 36.8 % (ref 36.0–46.0)
Hemoglobin: 11.6 g/dL — ABNORMAL LOW (ref 12.0–15.0)
MCH: 25.2 pg — ABNORMAL LOW (ref 26.0–34.0)
MCHC: 31.5 g/dL (ref 30.0–36.0)
MCV: 79.8 fL — ABNORMAL LOW (ref 80.0–100.0)
Platelets: 418 10*3/uL — ABNORMAL HIGH (ref 150–400)
RBC: 4.61 MIL/uL (ref 3.87–5.11)
RDW: 16 % — ABNORMAL HIGH (ref 11.5–15.5)
WBC: 7.2 10*3/uL (ref 4.0–10.5)
nRBC: 0 % (ref 0.0–0.2)

## 2024-03-03 LAB — COMPREHENSIVE METABOLIC PANEL WITH GFR
ALT: 14 U/L (ref 0–44)
AST: 18 U/L (ref 15–41)
Albumin: 3.9 g/dL (ref 3.5–5.0)
Alkaline Phosphatase: 58 U/L (ref 38–126)
Anion gap: 6 (ref 5–15)
BUN: 8 mg/dL (ref 6–20)
CO2: 26 mmol/L (ref 22–32)
Calcium: 8.9 mg/dL (ref 8.9–10.3)
Chloride: 105 mmol/L (ref 98–111)
Creatinine, Ser: 0.69 mg/dL (ref 0.44–1.00)
GFR, Estimated: >60 mL/min (ref >60)
Glucose, Bld: 109 mg/dL — ABNORMAL HIGH (ref 70–99)
Potassium: 3.3 mmol/L — ABNORMAL LOW (ref 3.5–5.1)
Sodium: 137 mmol/L (ref 135–145)
Total Bilirubin: 0.2 mg/dL (ref 0.0–1.2)
Total Protein: 7.2 g/dL (ref 6.5–8.1)

## 2024-03-03 LAB — ETHANOL: Alcohol, Ethyl (B): <15 mg/dL (ref <15)

## 2024-03-03 MED ORDER — LORAZEPAM 2 MG PO TABS
2.0000 mg | ORAL_TABLET | Freq: Once | ORAL | Status: AC
  Administered 2024-03-04: 2 mg via ORAL
  Filled 2024-03-03: qty 1

## 2024-03-03 MED ORDER — DIAZEPAM 5 MG/ML IJ SOLN: 5.0000 mg | Freq: Once | INTRAMUSCULAR | Status: DC
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
## 2024-03-03 NOTE — ED Provider Notes (Incomplete)
 [facility] Provider Note    Event Date/Time   First MD Initiated Contact with Patient 03/03/24 2327     (approximate)   History   Psychiatric Evaluation   HPI  Shannon Patel is a 43 y.o. female   Past medical history of ***    Independent Historian contributed to assessment above: ***  External Medical Documents Reviewed: ***      Physical Exam   Triage Vital Signs: ED Triage Vitals  Encounter Vitals Group     BP 03/03/24 2305 (!) 149/91     Systolic BP Percentile --      Diastolic BP Percentile --      Pulse Rate 03/03/24 2305 (!) 133     Resp 03/03/24 2305 (!) 22     Temp 03/03/24 2305 98.9 F (37.2 C)     Temp Source 03/03/24 2305 Oral     SpO2 03/03/24 2305 100 %     Weight 03/03/24 2310 154 lb 15.7 oz (70.3 kg)     Height --      Head Circumference --      Peak Flow --      Pain Score 03/03/24 2310 0     Pain Loc --      Pain Education --      Exclude from Growth Chart --     Most recent vital signs: Vitals:   03/03/24 2305 03/03/24 2313  BP: (!) 149/91   Pulse: (!) 133 (!) 118  Resp: (!) 22   Temp: 98.9 F (37.2 C)   SpO2: 100%     General: Awake, no distress. *** CV:  Good peripheral perfusion. *** Resp:  Normal effort. *** Abd:  No distention. *** Other:  ***   ED Results / Procedures / Treatments   Labs (all labs ordered are listed, but only abnormal results are displayed) Labs Reviewed  CBC - Abnormal; Notable for the following components:      Result Value   Hemoglobin 11.6 (*)    MCV 79.8 (*)    MCH 25.2 (*)    RDW 16.0 (*)    Platelets 418 (*)    All other components within normal limits  COMPREHENSIVE METABOLIC PANEL WITH GFR  ETHANOL  SALICYLATE LEVEL  ACETAMINOPHEN  LEVEL  URINE DRUG SCREEN, QUALITATIVE ([facility] ONLY)  POC URINE PREG, ED     I ordered and reviewed the above labs they are notable for ***  EKG  ED ECG REPORT I, Buell Carmin, the attending physician, personally viewed and  interpreted this ECG.   Date: 03/03/2024  EKG Time: ***  Rate: ***  Rhythm: {ekg findings:315101}  Axis: ***  Intervals:{conduction defects:17367}  ST&T Change: ***    RADIOLOGY I independently reviewed and interpreted *** I also reviewed radiologist's formal read.   PROCEDURES:  Critical Care performed: {CriticalCareYesNo:19197::"Yes, see critical care procedure note(s)","No"}  Procedures   MEDICATIONS ORDERED IN ED: Medications - No data to display  External physician / consultants:  I spoke with *** regarding care plan for this patient.   IMPRESSION / MDM / ASSESSMENT AND PLAN / ED COURSE  I reviewed the triage vital signs and the nursing notes.                                Patient's presentation is most consistent with {EM COPA:27473}  Differential diagnosis includes, but is not limited to, ***   ***The  patient is on the cardiac monitor to evaluate for evidence of arrhythmia and/or significant heart rate changes.  MDM:  ***  I considered hospitalization for admission or observation ***        FINAL CLINICAL IMPRESSION(S) / ED DIAGNOSES   Final diagnoses:  None     Rx / DC Orders   ED Discharge Orders     None        Note:  This document was prepared using Dragon voice recognition software and may include unintentional dictation errors.
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
## 2024-03-03 NOTE — ED Notes (Signed)
 Pt's items placed into belongings bag: - white sandals, shirt, shorts, belly button ring, hair tie, red purse  Pt dressed into burgundy scrubs

## 2024-03-04 LAB — URINE DRUG SCREEN, QUALITATIVE (ARMC ONLY)
Amphetamines, Ur Screen: NOT DETECTED
Barbiturates, Ur Screen: NOT DETECTED
Benzodiazepine, Ur Scrn: NOT DETECTED
Cannabinoid 50 Ng, Ur ~~LOC~~: NOT DETECTED
Cocaine Metabolite,Ur ~~LOC~~: POSITIVE — AB
MDMA (Ecstasy)Ur Screen: NOT DETECTED
Methadone Scn, Ur: NOT DETECTED
Opiate, Ur Screen: NOT DETECTED
Phencyclidine (PCP) Ur S: NOT DETECTED
Tricyclic, Ur Screen: NOT DETECTED

## 2024-03-04 LAB — SALICYLATE LEVEL: Salicylate Lvl: <7 mg/dL — ABNORMAL LOW (ref 7.0–30.0)

## 2024-03-04 LAB — ACETAMINOPHEN LEVEL: Acetaminophen (Tylenol), Serum: <10 ug/mL — ABNORMAL LOW (ref 10–30)

## 2024-03-04 LAB — TROPONIN I (HIGH SENSITIVITY): Troponin I (High Sensitivity): 3 ng/L (ref <18)

## 2024-03-04 MED ORDER — IBUPROFEN 600 MG PO TABS
600.0000 mg | ORAL_TABLET | Freq: Once | ORAL | Status: AC
  Administered 2024-03-04: 600 mg via ORAL
  Filled 2024-03-04: qty 1

## 2024-03-04 MED ORDER — ACETAMINOPHEN 500 MG PO TABS
1000.0000 mg | ORAL_TABLET | Freq: Once | ORAL | Status: AC
  Administered 2024-03-04: 1000 mg via ORAL
  Filled 2024-03-04: qty 2

## 2024-03-04 NOTE — ED Notes (Signed)
 Pt. Informed this RN she wished to leave, will notify MD

## 2024-03-04 NOTE — ED Provider Notes (Addendum)
 Emergency Medicine Observation Re-evaluation Note  Physical Exam   BP (!) 149/91   Pulse (!) 118   Temp 98.9 F (37.2 C) (Oral)   Resp (!) 22   Wt 70.3 kg   LMP 02/18/2024   SpO2 100%   BMI 27.45 kg/m   Patient resting  ED Course / MDM   No reported events during my shift at the time of this note.   Pt is awaiting dispo from consultants  -- 2:30 AM She wants to leave now.  She was here voluntarily after smoking crack saying that she needed respite and meet with psychiatrist.  She exhibited some paranoia but is not overtly psychotic.  She denies HI or SI.  At this time I do not think that she poses a imminent threat to herself or others and so I do not think I can reasonably keep her as an IVC.  Thus, I will let her discharge according to her wishes.   Buell Carmin MD    Buell Carmin, MD 03/04/24 Alessandra Ancona    Buell Carmin, MD 03/04/24 0230

## 2024-03-20 ENCOUNTER — Ambulatory Visit: Payer: MEDICAID | Admitting: Family Medicine

## 2024-03-20 DIAGNOSIS — Z9189 Other specified personal risk factors, not elsewhere classified: Secondary | ICD-10-CM

## 2024-03-20 DIAGNOSIS — Z113 Encounter for screening for infections with a predominantly sexual mode of transmission: Secondary | ICD-10-CM

## 2024-03-20 DIAGNOSIS — B9689 Other specified bacterial agents as the cause of diseases classified elsewhere: Secondary | ICD-10-CM

## 2024-03-20 LAB — HM HEPATITIS C SCREENING LAB: HM Hepatitis Screen: NEGATIVE

## 2024-03-20 LAB — HM HIV SCREENING LAB: HM HIV Screening: NEGATIVE

## 2024-03-20 MED ORDER — METRONIDAZOLE 500 MG PO TABS: 500.0000 mg | ORAL_TABLET | Freq: Two times a day (BID) | ORAL | Status: AC

## 2024-03-20 NOTE — Progress Notes (Signed)
 Treated for BV per SO.Aaron AasRosiland Cooks, RN
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
## 2024-03-21 ENCOUNTER — Ambulatory Visit: Payer: Self-pay

## 2024-03-21 LAB — WET PREP FOR TRICH, YEAST, CLUE
Clue Cell Exam: POSITIVE — AB
Trichomonas Exam: NEGATIVE
Yeast Exam: NEGATIVE

## 2024-03-22 LAB — HBV ANTIGEN/ANTIBODY STATE LAB
Hep B Core Total Ab: NONREACTIVE
Hep B S Ab: REACTIVE
Hepatitis B Surface Antigen: NONREACTIVE

## 2024-03-29 ENCOUNTER — Encounter: Payer: Self-pay | Admitting: Family Medicine

## 2024-05-10 ENCOUNTER — Emergency Department
Admission: EM | Admit: 2024-05-10 | Discharge: 2024-05-10 | Disposition: A | Discharge status: Home / Self Care | Payer: MEDICAID | Attending: Emergency Medicine | Admitting: Emergency Medicine

## 2024-05-10 ENCOUNTER — Ambulatory Visit
Admission: EM | Admit: 2024-05-10 | Discharge: 2024-05-10 | Disposition: A | Discharge status: Home / Self Care | Payer: MEDICAID

## 2024-05-10 ENCOUNTER — Other Ambulatory Visit: Payer: Self-pay

## 2024-05-10 DIAGNOSIS — F1590 Other stimulant use, unspecified, uncomplicated: Secondary | ICD-10-CM | POA: Diagnosis present

## 2024-05-10 DIAGNOSIS — F17210 Nicotine dependence, cigarettes, uncomplicated: Secondary | ICD-10-CM | POA: Diagnosis not present

## 2024-05-10 DIAGNOSIS — F1990 Other psychoactive substance use, unspecified, uncomplicated: Secondary | ICD-10-CM

## 2024-05-10 DIAGNOSIS — R4689 Other symptoms and signs involving appearance and behavior: Secondary | ICD-10-CM

## 2024-05-10 LAB — CBC WITH DIFFERENTIAL/PLATELET
Abs Immature Granulocytes: 0.03 10*3/uL (ref 0.00–0.07)
Basophils Absolute: 0.1 10*3/uL (ref 0.0–0.1)
Basophils Relative: 1 %
Eosinophils Absolute: 0.5 10*3/uL (ref 0.0–0.5)
Eosinophils Relative: 6 %
HCT: 37.4 % (ref 36.0–46.0)
Hemoglobin: 12.2 g/dL (ref 12.0–15.0)
Immature Granulocytes: 0 %
Lymphocytes Relative: 28 %
Lymphs Abs: 2.2 10*3/uL (ref 0.7–4.0)
MCH: 25.9 pg — ABNORMAL LOW (ref 26.0–34.0)
MCHC: 32.6 g/dL (ref 30.0–36.0)
MCV: 79.4 fL — ABNORMAL LOW (ref 80.0–100.0)
Monocytes Absolute: 0.7 10*3/uL (ref 0.1–1.0)
Monocytes Relative: 9 %
Neutro Abs: 4.5 10*3/uL (ref 1.7–7.7)
Neutrophils Relative %: 56 %
Platelets: 478 10*3/uL — ABNORMAL HIGH (ref 150–400)
RBC: 4.71 MIL/uL (ref 3.87–5.11)
RDW: 14.3 % (ref 11.5–15.5)
WBC: 8 10*3/uL (ref 4.0–10.5)
nRBC: 0 % (ref 0.0–0.2)

## 2024-05-10 LAB — BASIC METABOLIC PANEL WITH GFR
Anion gap: 9 (ref 5–15)
BUN: 9 mg/dL (ref 6–20)
CO2: 26 mmol/L (ref 22–32)
Calcium: 8.9 mg/dL (ref 8.9–10.3)
Chloride: 104 mmol/L (ref 98–111)
Creatinine, Ser: 0.58 mg/dL (ref 0.44–1.00)
GFR, Estimated: >60 mL/min (ref >60)
Glucose, Bld: 92 mg/dL (ref 70–99)
Potassium: 3 mmol/L — ABNORMAL LOW (ref 3.5–5.1)
Sodium: 139 mmol/L (ref 135–145)

## 2024-05-10 LAB — URINALYSIS, ROUTINE W REFLEX MICROSCOPIC
Bilirubin Urine: NEGATIVE
Glucose, UA: NEGATIVE mg/dL
Ketones, ur: 5 mg/dL — AB
Leukocytes,Ua: NEGATIVE
Nitrite: NEGATIVE
Protein, ur: NEGATIVE mg/dL
RBC / HPF: >50 RBC/hpf (ref 0–5)
Specific Gravity, Urine: 1.017 (ref 1.005–1.030)
pH: 5 (ref 5.0–8.0)

## 2024-05-10 LAB — URINE DRUG SCREEN, QUALITATIVE (ARMC ONLY)
Amphetamines, Ur Screen: POSITIVE — AB
Barbiturates, Ur Screen: NOT DETECTED
Benzodiazepine, Ur Scrn: NOT DETECTED
Cannabinoid 50 Ng, Ur ~~LOC~~: NOT DETECTED
Cocaine Metabolite,Ur ~~LOC~~: NOT DETECTED
MDMA (Ecstasy)Ur Screen: NOT DETECTED
Methadone Scn, Ur: NOT DETECTED
Opiate, Ur Screen: NOT DETECTED
Phencyclidine (PCP) Ur S: NOT DETECTED
Tricyclic, Ur Screen: NOT DETECTED

## 2024-05-10 LAB — ETHANOL: Alcohol, Ethyl (B): <15 mg/dL (ref <15)

## 2024-05-10 LAB — SALICYLATE LEVEL: Salicylate Lvl: <7 mg/dL — ABNORMAL LOW (ref 7.0–30.0)

## 2024-05-10 LAB — POC URINE PREG, ED: Preg Test, Ur: NEGATIVE

## 2024-05-10 LAB — ACETAMINOPHEN LEVEL: Acetaminophen (Tylenol), Serum: <10 ug/mL — ABNORMAL LOW (ref 10–30)

## 2024-05-10 MED ORDER — ACETAMINOPHEN 325 MG PO TABS
650.0000 mg | ORAL_TABLET | Freq: Once | ORAL | Status: AC
  Administered 2024-05-10: 650 mg via ORAL
  Filled 2024-05-10: qty 2

## 2024-05-10 NOTE — ED Notes (Signed)
Pt LWBS before triage.

## 2024-05-10 NOTE — ED Notes (Signed)
 Patient discharged home in good condition, d/c papers provided and reviewed. Shelter and detox resources provided to patient. Patient ambulated to lobby accompanied by this RN and security. Directed to phone in lobby for calls. Pt states she does not have anywhere to go and is requesting to stay in ER until morning. Charge RN informed of situation. Patient instructed to speak with security regarding remaining in lobby until morning.

## 2024-05-10 NOTE — ED Triage Notes (Signed)
 Patient ambulatory to bed from EMS stretcher.  Complaining of a snake bite on her feet and back. She was sitting at the bustop when EMS arrived. EMS VS: 108/58, 96, 97% RA. Patient is unable to tell staff what the snake looked like. Patient states this happened 4 to 5 days ago. Right foot has some minimal swelling and redness. No redness, bites, or swelling to lower back. AOX4.

## 2024-05-10 NOTE — ED Notes (Addendum)
 Patient dressed out into psych/hospital appropriate attire.   Two ear rings One belly button ring Two Black sparkly slides/shoes. Green tank top. Clear hair tie. Cheatea print bag Underwear Orange bag.

## 2024-05-10 NOTE — ED Notes (Signed)
 Called Lab to run the labs on the blood already sent down.
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
## 2024-05-10 NOTE — BH Assessment (Signed)
 Telepsych consult requested via phone call and secure chat.

## 2024-05-10 NOTE — BH Assessment (Signed)
 Per psych MD Charlene HERO., Patient does NOT meet criteria for inpatient psychiatric admission and can be discharged with shelter/substance abuse resources.   Writer provided pt's nurse with shelter/detox/substance abuse treatment resources.
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
## 2024-05-10 NOTE — ED Notes (Signed)
 Patient received to room 6, calm and cooperative, requesting a snack.

## 2024-05-10 NOTE — ED Notes (Signed)
 Patient requesting benadryl  for sleep. Provider declines at this time. Patient is scheduled for telepsych at 2130 and meds can be addressed again after psych evaluation. Pt informed

## 2024-05-10 NOTE — ED Notes (Signed)
 Patient was dressed out by RN and two ED techs. Patient stated that the EDP was not caring for her pain or current situation. Then the patient said if she was discharged that she would go out into public and kill someone. Patient stated homicidal idiology to EDP.

## 2024-05-10 NOTE — ED Notes (Signed)
 Snack provided

## 2024-05-10 NOTE — ED Notes (Signed)
 Assumed care of pt. Report received from previous RN. Pt alert and oriented. Pt RR even and unlabored. Pt denies pain at this time. Pt calm and cooperative. Pt denies any needs at this time.

## 2024-05-19 NOTE — ED Provider Notes (Addendum)
 CHIEF COMPLAINT:  Chief Complaint  Patient presents with  . Medical Screening     HPI  Shannon Patel is a 43 y.o. female who presents to the Emergency Department complaining of needing medical clearance for [facility].  The patient endorses taking a Suboxone this morning.  She denies any IV drug use.  The patient is constantly falling asleep without physical stimulation whether it is just touching her on the shoulder but not requiring painful stimulus or noxious stimulus.  REVIEW OF SYSTEMS:  Unable to obtain as patient keeps on falling asleep  PAST MEDICAL HISTORY:   Past Medical History[1]  Past Surgical History[2]   MEDICATIONS:  Current Medications[3]  ALLERGIES:  Allergies[4]    FAMILY HISTORY:  Family History[5]  SOCIAL HISTORY: Short Social History[6]  PHYSICAL EXAM: VITAL SIGNS: BP 116/61   Pulse 63   Temp 35.9 C (96.6 F) (Oral)   Resp 14   Ht 160 cm (5' 3)   Wt 68 kg (149 lb 14.6 oz)   SpO2 98%   BMI 26.56 kg/m  General: Awake and alert, no acute distress lying in the bed Eyes: Normal appearance, no icterus noted, extraocular movements are intact, pupils are equal and reactive, 2 mm reactive bilaterally HENT: NCAT. Oropharynx clear, mucous membranes are moist. Neck: Supple, normal ROM. Cardiovascular: Regular rate and rhythm, normal S1 and S2. Pulmonology: Normal work of breathing, clear to auscultation bilaterally. Abdomen: Nontender and nondistended, no rebound or guarding GU: Deferred Back: No masses or obvious deformity. Musculoskeletal: No obvious deformity.  Normal range of motion of the joints Skin: Warm, dry, no rash. Lymphatic: No cervical lymphadenopathy Neurologic: GCS eyes: 3, verbal: 4, motor: 5 Psychiatric: Cooperative, denies suicidal or homicidal ideation.  LABS:  Labs Reviewed  COMPREHENSIVE METABOLIC PANEL - Abnormal; Notable for the following components:      Result Value   Potassium 3.4 (*)    BUN 7 (*)    Total  Protein 8.3 (*)    All other components within normal limits  URINALYSIS WITH MICROSCOPY - Abnormal; Notable for the following components:   Specific Gravity, UA 1.004 (*)    Bacteria, UA Rare (*)    All other components within normal limits  DRUG SCREEN, URINE - Abnormal; Notable for the following components:   Amphetamines Screen, Ur Positive (*)    Fentanyl Screen, Ur Positive (*)    All other components within normal limits   Narrative:    Urine Drugs of Abuse screening results are not confirmed and are for medical treatment only. This drug screen is not to be used for non-medical purposes (e.g. legal testing, employment testing).                                  CUTOFF LEVELS                 Amphetamine            <1000 ng/ml                  Barbiturate            <200 ng/ml                  Benzodiazepine         <200 ng/ml                  Buprenorphine          <  5 ng/ml                 Cannabinoid (THC)      <50 ng/ml                  Cocaine                <300 ng/ml                  Fentanyl               <1 ng/ml                 Methadone              <150 ng/ml                  Opiate                 <300 ng/ml                 Oxycodone               <100 ng/ml                 Phencyclidine          <25 ng/mL   ACETAMINOPHEN  LEVEL - Abnormal; Notable for the following components:   Acetaminophen  Level 2.9 (*)    All other components within normal limits  CBC W/ AUTO DIFF - Abnormal; Notable for the following components:   RBC 5.32 (*)    MCH 25.1 (*)    MCHC 31.7 (*)    RDW 15.9 (*)    Platelet 510 (*)    Hypochromasia Slight (*)    All other components within normal limits  ETHANOL - Normal   Narrative:    This assay is intended for acutely ill patients and is not for legal use. It determines ethanol concentration in serum or plasma (ie, it is not a BAC). Levels > 30 mg/dL are generally considered strong evidence of alcohol use. Low level cross-reactivity may be  seen with isopropyl alcohol skin prep. Results should be interpreted in conjunction with patient history and clinical presentation.  TSH - Normal  SALICYLATE LEVEL - Normal  POCT PREGNANCY, URINE ([id number]) - Normal  CBC W/ DIFFERENTIAL   Narrative:    The following orders were created for panel order CBC w/ Differential.                 Procedure                               Abnormality         Status                                    ---------                               -----------         ------                                    CBC w/ Differential[[id number]]         Abnormal  Final result                                               Please view results for these tests on the individual orders.    RADIOLOGY: No orders to display    EKG: Encounter Date: 05/19/24  ECG 12 Lead  Result Value   EKG Systolic BP 146   EKG Diastolic BP 76   EKG Ventricular Rate 62   EKG Atrial Rate 62   EKG P-R Interval 118   EKG QRS Duration 102   EKG Q-T Interval 446   EKG QTC Calculation 452   EKG Calculated P Axis 54   EKG Calculated R Axis 71   EKG Calculated T Axis 69   QTC Fredericia 451   Narrative   NORMAL SINUS RHYTHM NORMAL ECG WHEN COMPARED WITH ECG OF 14-Apr-2022 11:28, NO SIGNIFICANT CHANGE WAS FOUND Confirmed by Antonetta Handler ([id number]) on 05/19/2024 8:31:03 AM    ED COURSE/MDM:  Outside medical record review: Per chart review: Patient with most recent psychiatry evaluation at [facility] health 05/10/2024.  Patient that time was evaluated for bizarre thought process.  Thought to be associated with substance-induced mood psychosis.  The patient was ultimately not recommended for involuntary commitment.  As noted she previously has been diagnosed with bipolar disorder.  Previous medications of hydroxyzine  and olanzapine.    Outside result review: The patient with most recent outside hospital laboratories 11/03/2023 with a UDS that was negative and a hemoglobin A1c of 5.5%.   The TSH level at that time was normal at 1.55  Differential Diagnosis: Patient presents with sleepiness drowsiness requiring verbal stimulation but still really unable to get decent history.  I have considered opiate especially given that she endorses taking Suboxone but also has pinpoint pupils and is hypopneic.  ED Course: Patient was seen and examined meeting was put to the room.  We are to go ahead and give her some Narcan see if she responds.  Will plan on doing a to medical clearance.  With no suicidal or homicidal ideations no psychosis and within the patient requires involuntary commitment at this time.  ED Course as of 05/19/24 1451  Fri May 19, 2024  1049 No response to the 0.4 mg.  We did 2 mg of Narcan IV as well and again no significant response.  She awakens to verbal stimuli.  Could be because she took Suboxone its already a partial agonist.   1211: Fentanyl and amphetamine positive.  Or allow the patient to metabolize to freedom as she is not requiring any invasive oxygenation support.  Still awakens to voice.  251: Patient is been allowed to sober.  Opiate based off of fentanyl would have completely metabolized well within this timeframe.  She awakens has no suicidal or homicidal ideation.  Patient was ultimately discharged home in stable condition.  She can choose to try a detox program if she like.  Medications administered during this encounter: Medications  naloxone (NARCAN) injection 0.4 mg (0.4 mg Intravenous Given 05/19/24 0844)  naloxone (NARCAN) injection 2 mg (2 mg Intravenous Given 05/19/24 0952)    IMPRESSION:  1. Polysubstance abuse (CMS-HCC)     DISPOSITION: Discharge home, stable condition  FOLLOW-UP: [facility] [address] [place] [address] [place]  [address] [phone] Go to    [facility]  [address] [place]  [address] [phone] Go to     DISCHARGE  MEDICATIONS: New Prescriptions   No medications on file     Antonetta Sherlean Croak, MD 05/19/24 1214       [1] Past Medical History: Diagnosis Date  . Anxiety   . Back pain   . Depression   . Migraine   . Opiate abuse, continuous (CMS-HCC)   . Schizophrenia     . Tobacco use   [2] Past Surgical History: Procedure Laterality Date  . NO PAST SURGERIES    [3] No current facility-administered medications for this encounter.   Current Outpatient Medications  Medication Sig Dispense Refill  . albuterol  (PROVENTIL  HFA;VENTOLIN  HFA) 90 mcg/actuation inhaler 2 inhalations every 6 hours (Patient not taking: Reported on 06/24/2018) 1 Inhaler 0  . QUEtiapine (SEROQUEL) 200 MG tablet Take 1 tablet (200 mg total) by mouth nightly for 7 days. (Patient not taking: Reported on 09/12/2023) 7 tablet 0  [4] No Known Allergies [5] No family history on file. [6] Social History Tobacco Use  . Smoking status: Every Day    Current packs/day: 0.50    Types: Cigarettes    Passive exposure: Current  . Smokeless tobacco: Never  Vaping Use  . Vaping status: Never Used  Substance Use Topics  . Alcohol use: No    Alcohol/week: 0.0 standard drinks of alcohol  . Drug use: Yes    Types: Methamphetamines, Other    Comment: Gabapentin is only med in last 30 days per patient (09/12/23)   Antonetta Sherlean Croak, MD 05/19/24 1451
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
## 2024-05-23 NOTE — ED Provider Notes (Signed)
 [facility] Emergency Department Provider Note    ED Clinical Impression     Diagnosis ICD-10-CM Associated Orders  1. Psychosis, unspecified psychosis type     F29     2. Polysubstance abuse (CMS-HCC)  F19.10           Impression, Medical Decision Making, Progress Notes and Critical Care    Impression, Differential Diagnosis and Plan of Care  Patient is a 43 year old female with past medical history of anxiety, depression, opiate abuse, schizophrenia who presented to the ED due to safety concerns (currently homeless) as well as epigastric abdominal pain, chest pain, and shortness of breath, currently endorsing AH.  On initial presentation, patient is not in acute distress but has tangential thoughts, endorses recent drug use including meth, vitals only significant for a heart rate of 132 on initial presentation, during exam is 100.  Differential diagnosis includes but is not limited to acute anxiety attack, drug toxidrome, ACS, pneumonia, UTI, acute psychosis, electrolyte abnormalities, schizophrenia, suicidal ideation, pregnancy, and dehydration.    ED Course as of 05/23/24 1544  Tue May 23, 2024  0935 CBC showing no leukocytosis, hemoglobin within normal limits.  EKG showing no acute ischemic changes, normal sinus rhythm at a rate of 95.  Patient likely dehydrated, given 1 L IVF.  1038 CMP unremarkable, lipase within normal limits.  Troponin normal, ethanol level less than 10, she is not pregnant at this time.  UDS was significant for amphetamine, cocaine, and fentanyl.  1038 Patient is medically cleared and pending psychiatry evaluation, will likely need placement as well.  1100 Case management was consulted for housing placement in the event that psych clears her for discharge.   1543 UDS was significant for amphetamine, cocaine, and fentanyl.  Patient was evaluated by psychiatry and will be admitted by them for acute psychosis secondary to polysubstance abuse in the setting  of unstable housing.  Patient was in agreement with treatment plan, admission orders placed.  Denies SI/HI at this time, no VH.  Additional MDM Elements              Portions of this record have been created using Dragon dictation software. Dictation errors have been sought, but may not have been identified and corrected.  See chart and nursing documentation for additional ED course details.  ____________________________________________      History     Reason for Visit Abdominal Pain   HPI  Shannon Patel is a 43 y.o. female with past medical history of anxiety, depression, and polysubstance abuse who presents to the ED with multiple complaints.  She states that she does not feel safe and has been recently homeless after her husband was arrested.  She is complaining of epigastric abdominal pain that has been present for 3 months, states she believes her husband is the cause of her abdominal pain but unclear what he specifically did.  She is also endorsing chest pain and shortness of breath, does endorse recent Suboxone and methamphetamine use last night, she states that she takes drugs at night to stay awake because she does not want to get her in the streets by strangers.  She is denying SI/HI/VH at this time, but endorses auditory hallucinations of people she knows telling her that her life is in danger.  At this time, denies any other symptoms.  Outside Historian(s) (EMS, Significant Other, Family, Parent, Caregiver, Friend, Patent examiner, etc.)   Past Medical History[1]  Past Surgical History[2]   Current Facility-Administered Medications:  .  acetaminophen  (TYLENOL ) tablet 650 mg, 650 mg, Oral, Q6H PRN, Zhu, Jackie J, MD .  aluminum-magnesium  hydroxide-simethicone (MAALOX MAX) 80-80-8 mg/mL oral suspension, 30 mL, Oral, Q4H PRN, Zhu, Jackie J, MD .  OLANZapine zydis (ZYPREXA) disintegrating tablet 5 mg, 5 mg, Oral, Daily, Annah Therisa HERO, MD, 5 mg at 05/23/24 1207 .   polyethylene glycol (MIRALAX) packet 17 g, 17 g, Oral, Daily PRN, Zhu, Jackie J, MD  Current Outpatient Medications:  .  albuterol  (PROVENTIL  HFA;VENTOLIN  HFA) 90 mcg/actuation inhaler, 2 inhalations every 6 hours, Disp: 1 Inhaler, Rfl: 0 .  QUEtiapine (SEROQUEL) 200 MG tablet, Take 1 tablet (200 mg total) by mouth nightly for 7 days., Disp: 7 tablet, Rfl: 0  Allergies Patient has no known allergies.  Family History[3]  Short Social History[4]    Physical Exam   ED Triage Vitals  Enc Vitals Group     BP 05/23/24 0745 143/93     Pulse 05/23/24 0740 132     SpO2 Pulse 05/23/24 0745 110     Resp 05/23/24 0745 18     Temp 05/23/24 0745 36.6 C (97.9 F)     Temp Source 05/23/24 0745 Oral     SpO2 05/23/24 0740 100 %     Weight --      Height --      Head Circumference --      Peak Flow --      Pain Score --      Pain Loc --      Pain Education --      Exclude from Growth Chart --     Constitutional: Alert and oriented. Well appearing and in no distress.  Anxious on exam. Eyes: Conjunctivae are normal. ENT      Head: Normocephalic and atraumatic.      Nose: No congestion.      Mouth/Throat: Mucous membranes are dry.      Neck: No stridor. Hematological/Lymphatic/Immunilogical: No cervical lymphadenopathy. Cardiovascular: Tachycardia, regular rhythm. Normal and symmetric distal pulses are present in all extremities. Respiratory: Normal respiratory effort. Breath sounds are normal. Gastrointestinal: Soft and nontender. There is no CVA tenderness. Musculoskeletal: Normal range of motion in all extremities.      Right lower leg: No tenderness or edema.      Left lower leg: No tenderness or edema. Neurologic: Normal speech and language. No gross focal neurologic deficits are appreciated. Skin: Skin is warm, dry and intact. No rash noted. Psychiatric: Tangential thoughts on exam, rapidly speaking but will follow commands, endorses AH of people she knows telling her she is in  danger, denies SI/HI/VH.    Radiology   XR Chest 1 view Portable  Final Result    No acute airspace disease.           Procedures including Critical Care         [1] Past Medical History: Diagnosis Date  . Anxiety   . Back pain   . Depression   . Migraine   . Opiate abuse, continuous (CMS-HCC)   . Schizophrenia      . Tobacco use   [2] Past Surgical History: Procedure Laterality Date  . NO PAST SURGERIES    [3] History reviewed. No pertinent family history. [4] Social History Tobacco Use  . Smoking status: Every Day    Current packs/day: 0.50    Types: Cigarettes    Passive exposure: Current  . Smokeless tobacco: Never  Vaping Use  . Vaping status: Never Used  Substance Use  Topics  . Alcohol use: No    Alcohol/week: 0.0 standard drinks of alcohol  . Drug use: Yes    Types: Methamphetamines, Other    Comment: Gabapentin is only med in last 30 days per patient (09/12/23)   Marti Mitts, DO Resident 05/23/24 1544

## 2024-05-23 NOTE — ED Triage Notes (Signed)
-------------------------------------------------------------------------------   Attestation signed by Gari Ozell Salles, MD at 05/24/24 1340 Ozell Salles Gari, MD  -------------------------------------------------------------------------------    05/23/24 1340  Pair List Accept  Accept Accept  Patient accepted to unit: Crisis  Acceptance Reasons Admission estimated between 1500-1800

## 2024-05-24 NOTE — Consults (Signed)
 OCCUPATIONAL THERAPY Evaluation 05/24/2024 Patient Name: Shannon Patel  Medical Record Number: [id number]  Date of Birth: [birth year] Treatment Diagnosis: Decline in occupational participation in setting of mental health decline in context of psychosocial stressors and substance use. Shannon Patel presented 7/15 with reports of upper gastric pain, auditory hallucinations, delusions, paranoia, housing insecurity, and recent breakup with her spouse (early this month), in the context of methamphetamine intoxication. Her history is significant for substance use disorder, psychosis, adjustment disorder, low back pain, depression, paranoia, anxiety, PTSD, and prior psychiatric hospitalizations.  Occupational Therapy Session Duration OT Individual [mins]: 28 (15 min eval + 13 min treat)    ASSESSMENT Assessment - Patient presents: Shannon Patel presents side-lying on right in own bed, arousable to name, and receptive to moderate complexity OT evaluation and treatment session. Throughout session, pt presented as polite with good eye contact and disorganized thought process. She acknowledged reason for admit though she provided summary inconsistent with chart review (e.g. that her spouse is supportive). OTR/L provided education on the role of therapy in the recovery process, working with Shannon Patel to identify her priority goal areas. Shannon Patel verbalized understanding of education provided and identified family relationships, managing stress and anxiety, friendships, and grief processing as her priorities. Shannon Patel requires skilled OT services to address these areas as well as emotion regulation, building insight, communication/social skills, life skill training, community resources, self-care, and relapse prevention. Upon completion of evaluation, OTR/L initiated training on self-care. OT used socratic questioning approach to explore effective strategies for self-care while inpatient as well as in the community. OT engaged  Shannon Patel in exploring ways that addressing ADLs and overall wellness can promote self-efficacy, self esteem, and occupational engagement. Shannon Patel verbalized understanding, requiring guidance to focus on the topic due to disorganized thought process. She required verbal cuing that her t-shirt was on backward, and she self-determined to leave it as it was rather than adjust it. She continues to require skilled OT services to address plan of care.  Today's Interventions: Education - Patient, Self-Care and accountability  PLAN OF CARE Plan of Care Initiated: 05/24/24 Daily Frequency: 1-2x per day Weekly Frequency: 2-3x week while in acute setting (may require up/down grade depending on pt participation)  Planned Treatment Duration: 06/07/24    Post Acute Discharge Recommendations: Skilled OT services NOT indicated Additional Discharge Recommendations: Outpatient Mental Health Therapy, Community Support  OT DME Recommendations: None Sensory Equipment Recommendations : Primary school teacher, other (provided fidget per pt request; RN aware) Planned Interventions: Equities trader, [employer], Special educational needs teacher and social skills, Emotion regulation Skills, Land based strategies, Education - Patient, Life style adaptation training, Home exercise program, Functional cognition, Education - Family / caregiver, Relapse prevention, Vocation training   Patient/Family Goals:  to discharge   Short Term Goals:  By 7/30 Shannon Patel will engage in x4 self-reflective occupations, with 3 or fewer cues, to support insight, self-compassion, and engagement in meaningful roles/relationships.  Time Frame : 2 weeks  By 7/30 Shannon Patel will identify/return demonstrate 3 effective techniques to support emotional regulation with fewer than 5 verbal cues in order to promote adaptive coping and improve occupational engagement.  Time Frame : 2 weeks  By 7/30 Shannon Patel will return demonstrate/identify 2 health management skills  to use in their regular routine with fewer than 5 verbal cues in order to support overall psychosocial wellbeing.  Time Frame : 2 weeks        Planned Interventions: Equities trader, [employer], Special educational needs teacher and social skills, Emotion regulation Skills, Sensory based strategies, Education -  Patient, Life style adaptation training, Home exercise program, Functional cognition, Education - Family / caregiver, Relapse prevention, Vocation training  Prognosis: Fair  Positive Indicators:  (familiarity with mental health recovery) Barriers: Difficulty modulating behavior, Frequent hospitalizations, Hx of noncompliance with recommendations, Hx of substance abuse, Lack of motivation, Lack of support, Medical co-morbidities, Unstable living environment (limited insight; limited informal support system; psychosocial stressors)  SUBJECTIVE Patient / Caregiver reports: I thought I was leaving today, but it doesn't look like that's gonna happen Communication Preference: Verbal, Written, Visual      Current Medications[1]  Past Medical History[2]  Social History   Tobacco Use  . Smoking status: Every Day    Current packs/day: 0.50    Types: Cigarettes    Passive exposure: Current  . Smokeless tobacco: Never  Substance Use Topics  . Alcohol use: No    Alcohol/week: 0.0 standard drinks of alcohol   Past Surgical History[3] Family History[4]  Allergies:  Patient has no known allergies.   OBJECTIVE  General Medical Tests / Procedures: chart review Pain Comments: Denied. After session, pt overheard reporting left flank pain to RN.  Risk Factors Risk Factors: None  Precautions / Restrictions Precautions: Psych Safety precautions Weight Bearing Status: Non-applicable Required Braces or Orthoses: Non-applicable   OCCUPATIONAL PROFILE    Occupational Profile Summary: Shannon Patel is a 43 year old mother of 3 (in contact with 23 year old; 13 and 43 year old in foster care  and pt is not in contact with them), adult daughter (mother is involved and supportive), sister (brother), and spouse (pt reports good relationship; however, per chart review, they've been separated since the beginning of July). She reports that her daily routine has little structure, which she attributes to her housing insecurity. She enjoys listening to music, doing word searches, and using adult coloring pages.  ROLES  Child, Parent, Sibling, Spouse SHOPPING  Not Responsible to complete  ADLS  Needs assistance with ADLs (wearing t-shirt backward) CAREGIVING Not Responsible to complete (younger children in foster care)  HOMEMAKING Not Responsible to complete BILL PAYING and FINANCES  Needs assistance  MEAL PREP  Not Responsible to complete DRIVING and COMMUNITY MOBILITY  Needs assistance  MEDICATION MANAGEMENT  Needs assistance      Education Level GED Vocation Unemployed Living Environment Homeless, Other (Reports she plans to stay with her mom, mom's husband, and her brother at discharge.), Home Living: Able to independently navigate home Lives With Alone Leisure / Play not engaging routinely Rest and Sleep Impaired  CLIENT FACTORS   Spiritual Beliefs : Denied spiritual beliefs Cognition: A&O x person, place, grossly to situation. Did not assess orientation to date Sensory Functions: wears reading glasses; hearing [facility] for conversation in quiet environment; denies numbness/tingling at the periphery Body Structures: appears WFL  PARTICIPATION Active participation THOUGHT PROCESSES Disorganized  EYE CONTACT Good HALLUCINATION  Auditory  MOOD Anxious DELUSIONS Persecutory  SPEECH  Clear SAFETY JUDGEMENT Decreased awareness of need for assistance, Unable to identify warning signs of crisis/decompensation, Limited understanding of mental health diagnosis, Noncompliant with health management responsibilities     PERFORMANCE SKILLS   MOTOR SKILLS appears to be [facility] SOCIAL INTERACTION  SKILLS argumentative, impaired (strained interpersonal relationships)  PROCESS SKILLS impaired, impaired attention, impaired ability to transition, impaired decision making, impaired organization (impaired initiation) EMOTION REGULATION SKILLS Anxious, Decreased frustration tolerance, Irritable, impaired, Impulsive, Disinhibited     PERFORMANCE PATTERNS   Habits - Useful: familiarity with mental health recovery; support from her mother Habits - Impoverished: health  management; distress tolerance; emotion regulation; supportive interpersonal relationships Habits - Dominating: substance use; housing insecurity; psychosocial stressors Routines - Satisfying: listening to music Routines - Damaging: substance use; housing insecurity; psychosocial stressors   I attest that I have reviewed the above information. Signed: Darice Collie, OT Filed 05/24/2024        [1] Current Facility-Administered Medications  Medication Dose Route Frequency Provider Last Rate Last Admin  . acetaminophen  (TYLENOL ) tablet 650 mg  650 mg Oral Q6H PRN Zhu, Jackie J, MD   650 mg at 05/24/24 1042  . aluminum-magnesium  hydroxide-simethicone (MAALOX MAX) 80-80-8 mg/mL oral suspension  30 mL Oral Q4H PRN Zhu, Jackie J, MD      . baclofen (LIORESAL) tablet 10 mg  10 mg Oral Q6H PRN Zhu, Jackie J, MD   10 mg at 05/23/24 1821  . cloNIDine HCL (CATAPRES) tablet 0.1 mg  0.1 mg Oral Q6H PRN Zhu, Jackie J, MD   0.1 mg at 05/23/24 1821  . dicyclomine (BENTYL) tablet 20 mg  20 mg Oral Q6H PRN Zhu, Jackie J, MD   20 mg at 05/23/24 1821  . ibuprofen  (MOTRIN ) tablet 600 mg  600 mg Oral Q6H PRN Zhu, Jackie J, MD   600 mg at 05/23/24 1821  . loperamide  (IMODIUM ) capsule 2 mg  2 mg Oral Q1H PRN Zhu, Jackie J, MD   2 mg at 05/23/24 1821  . LORazepam  (ATIVAN ) tablet 2 mg  2 mg Oral Q4H PRN Zhu, Jackie J, MD      . OLANZapine zydis (ZYPREXA) disintegrating tablet 5 mg  5 mg Oral BID PRN Zhu, Jackie J, MD   5 mg at 05/24/24 1043  .  ondansetron  (ZOFRAN -ODT) disintegrating tablet 4 mg  4 mg Oral Q8H PRN Zhu, Jackie J, MD      . polyethylene glycol (MIRALAX) packet 17 g  17 g Oral Daily PRN Zhu, Jackie J, MD   17 g at 05/24/24 1042  [2] Past Medical History: Diagnosis Date  . Anxiety   . Back pain   . Depression   . Migraine   . Opiate abuse, continuous (CMS-HCC)   . Schizophrenia      . Tobacco use   [3] Past Surgical History: Procedure Laterality Date  . NO PAST SURGERIES    [4] History reviewed. No pertinent family history.
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
## 2024-05-25 NOTE — Care Plan (Signed)
  Problem: Suicide Risk Goal: Absence of Self-Harm Outcome: Progressing Intervention: Assess Risk to Self and Maintain Safety Recent Flowsheet Documentation Taken 05/25/2024 1130 by Clorinda Bonni Liv, RN Behavior Management: . behavioral plan developed . behavioral plan reviewed Intervention: Promote Psychosocial Wellbeing Recent Flowsheet Documentation Taken 05/25/2024 0800 by Clorinda Bonni Liv, RN Sleep/Rest Enhancement: . consistent schedule promoted . regular sleep/rest pattern promoted   Problem: Adult Behavioral Health Plan of Care Goal: Plan of Care Review Outcome: Progressing Goal: Patient-Specific Goal (Individualization) Outcome: Progressing Goal: Adheres to Safety Considerations for Self and Others Outcome: Progressing Goal: Absence of New-Onset Illness or Injury Outcome: Progressing Intervention: Prevent Skin Injury Recent Flowsheet Documentation Taken 05/25/2024 0800 by Clorinda Bonni Liv, RN Device Skin Pressure Protection: adhesive use limited Skin Protection: adhesive use limited Goal: Optimized Coping Skills in Response to Life Stressors Outcome: Progressing Goal: Develops/Participates in Biomedical scientist to Support Successful Transition Outcome: Progressing Goal: Rounds/Family Conference Outcome: Progressing   Problem: Depression Goal: LTG: Chalyn will increase coping skills to manage depression so that it no longer impairs their activities of daily living Outcome: Progressing Goal: LTG: Lyanne will demonstrate decreased symptoms of depression Outcome: Progressing Goal: LTG: Jacquelin will exhibit compliance with therapy Outcome: Progressing Goal: STG: Ellin will verbalize absence of a suicidal plan Outcome: Progressing   Problem: Depression Goal: LTG: Shantoria will exhibit compliance with therapy Outcome: Progressing

## 2024-05-25 NOTE — Discharge Summary (Signed)
 ------------------------------------------------------------------------------- Attestation signed by Marget Edsel Berg, MD at 05/25/24 2304 I saw and evaluated the patient, participating in the key portions of the service on the day of discharge. I reviewed the discharge note and agree with discharge plans and disposition. I personally spent greater than 30 minutes in discharge planning services.   Edsel Berg Marget, MD   -------------------------------------------------------------------------------  [facility] Psychiatry  Discharge Summary  Admit date and time: 05/23/2024  7:50 AM Discharge date and time: 05/25/2024 3 PM Discharge to: Home Discharge Service: Psychiatry [facility]) Discharge Attending Physician: Dr. Edsel Marget Discharge Unit 24-7 Contact Number: [phone] [facility]  Allergies: Patient has no known allergies.  Chief Concern/Admitting Diagnosis:  ABDOMINAL PAIN   Discharge Diagnosis:  Active Problems:   Substance use disorder (POA: Yes)   Psychosis    (POA: Unknown) Resolved Problems:   * No resolved hospital problems. *   Stressors: Housing instability, interpersonal conflict, substance use  Disability Assessment Scale: Moderate   Discharge Day Services:  Hours of sleep overnight :  8  The treatment team, including resident and attending physicians, nursing staff, and social work/case management , met to discuss the patient's progress and plan of care.  Per 05/24/24 RN Epic note:  Pt alert, Ox4. Mood okay ; affect restless, anxious, yet pleasant cooperative. Fidgeting with clothes/restlessness noted; Denies SIB, SI, HI, VH; endorses AH by stating I always have a pilot with me; he doesn't mess up, just me and denies commands/safety concerns; contacts for safety. Compliant with scheduled meds; no adverse effects noted. PRN meds requested: Miralax, tylenol , baclofen, and zyprexa (requested clonidine but did not meet parameters d/t DBP); appropriate  responses noted. VSS. Reported 6/10 back pain resolved with tylenol . Denies sleep, appetite, GI/GU issues. Dishelved but appears to be engaging in daily hygiene. Socializing appropriate when not sleeping in pt room; appears engaging in groups, activities, milieu; no visits/unit phone calls noted. Refer to plan care note for pt specific goals/progress. Pt requested to speak with provider regarding discharge date/voluntary commit status; provider notified; pt informed not discharging today and to bring up in team meeting tomorrow morning; pt verbalized understanding and agreement.     Report given to oncoming RN; bedside handoff completed around 1930.  MD interview:  Feels rested. Mood is okay. No thoughts of hurting herself or others. Reports improved auditory hallucinations. She denied seeing things that others cannot. Does not have any set goals for today. Patient talked with her mother yesterday. The patient does not want us  to discuss her healthcare status and drug screen results with her mother but is okay with discussing discharge plans. No physical concerns. Amenable to filling out safety plan.  ROS: Denies SI, HI, visual hallucinations. Improved auditory hallucinations. Denies dysuria, abdominal pain.   Vitals: Patient Vitals for the past 12 hrs:  BP Temp Temp src Pulse Resp SpO2  05/25/24 0746 95/71 36.5 C (97.7 F) Temporal 71 18 100 %    Height:  160 cm (5' 3) Weight: 64.4 kg (142 lb) BMI: Body mass index is 25.15 kg/m.   Mental Status Exam: Appearance:    Appears stated age, Well nourished, Well developed, and Disheveled  Motor:   No abnormal movements  Speech/Language:    Normal rate, volume, tone, fluency and Language intact, well formed  Mood:   Good.  Affect:   Cooperative, Euthymic, and Mood congruent  Thought process:   Concrete; Improved circumstantiality and tangentiality as evidenced by ability to provide coherent and appropriate answers to questioning  Thought  content:     Denies SI, HI, self harm, delusions, obsessions, paranoid ideation, or ideas of reference  Perceptual disturbances:     Auditory, endorses improved voices; Denies visual hallucination. Seen glancing around the room once, which is notably improved from prior evaluations.  Orientation:   Oriented to person, place, time, and general circumstances  Attention:   Able to fully attend without fluctuations in consciousness  Concentration:   Able to fully concentrate and attend  Memory:   Immediate, short-term, long-term, and recall grossly intact   Fund of knowledge:    Consistent with level of education and development  Insight:     Limited  Judgment:    Impaired  Impulse Control:   Impaired    Test Results: Data Review: CBC - Results in Past 30 Days Result Component Current Result Ref Range Previous Result Ref Range  HCT 34.6 (05/23/2024) 34.0 - 44.0 % 42.1 (05/19/2024) 34.0 - 44.0 %  HGB 11.7 (05/23/2024) 11.3 - 14.9 g/dL 86.6 (2/88/7974) 88.6 - 14.9 g/dL  MCH 73.6 (2/84/7974) 74.0 - 32.4 pg 25.1 (L) (05/19/2024) 25.9 - 32.4 pg  MCHC 33.9 (05/23/2024) 32.0 - 36.0 g/dL 68.2 (L) (2/88/7974) 67.9 - 36.0 g/dL  MCV 22.3 (2/84/7974) 22.3 - 95.7 fL 79.2 (05/19/2024) 77.6 - 95.7 fL  MPV 7.1 (05/23/2024) 6.8 - 10.7 fL 7.2 (05/19/2024) 6.8 - 10.7 fL  Platelet 426 (05/23/2024) 150 - 450 10*9/L 510 (H) (05/19/2024) 150 - 450 10*9/L  RBC 4.46 (05/23/2024) 3.95 - 5.13 10*12/L 5.32 (H) (05/19/2024) 3.95 - 5.13 10*12/L  WBC 9.0 (05/23/2024) 3.6 - 11.2 10*9/L 6.6 (05/19/2024) 3.6 - 11.2 10*9/L   BMP - Results in Past 30 Days Result Component Current Result Ref Range Previous Result Ref Range  BUN <5 (L) (05/23/2024) 9 - 23 mg/dL 7 (L) (2/88/7974) 9 - 23 mg/dL  Chloride 893 (2/84/7974) 98 - 107 mmol/L 98 (05/19/2024) 98 - 107 mmol/L  CO2 26.0 (05/23/2024) 20.0 - 31.0 mmol/L 30.0 (05/19/2024) 20.0 - 31.0 mmol/L  Creatinine 0.60 (05/23/2024) 0.55 - 1.02 mg/dL 9.36 (2/88/7974) 9.44 - 1.02 mg/dL  Glucose 886  (2/84/7974) 70 - 179 mg/dL 83 (2/88/7974) 70 - 820 mg/dL  Potassium 3.4 (2/84/7974) 3.4 - 4.8 mmol/L 3.4 (L) (05/19/2024) 3.5 - 5.1 mmol/L  Sodium 141 (05/23/2024) 135 - 145 mmol/L 137 (05/19/2024) 136 - 145 mmol/L   Toxicology screen- Results in Past 30 Days Result Component Current Result Ref Range Previous Result Ref Range  Amphetamines Screen, Ur Positive (A) (05/23/2024) <500 ng/mL Positive (A) (05/19/2024) Negative  Barbiturates Screen, Ur Negative (05/23/2024) <200 ng/mL Negative (05/19/2024) Negative  Benzodiazepines Screen, Urine Negative (05/23/2024) <200 ng/mL Negative (05/19/2024) Negative  Cannabinoids Screen, Ur Negative (05/23/2024) <20 ng/mL Negative (05/19/2024) Negative  Cocaine(Metab.)Screen, Urine Positive (A) (05/23/2024) <150 ng/mL Negative (05/19/2024) Negative  Methadone Screen, Urine Negative (05/23/2024) <300 ng/mL Negative (05/19/2024) Negative  Opiates Screen, Ur Negative (05/23/2024) <300 ng/mL Negative (05/19/2024) Negative   Imaging: Radiology report(s) reviewed. Pending Test Results: No pending test or studies  Psychometrics: Not applicable  Hospital Course:     Shannon Patel is a 43 y.o. female with a history of polysubstance use disorder, MDD, PTSD, and unspecified psychosis, being admitted to [facility] Psychiatric inpatient service crisis stabilization unit for safety, stabilization, and management of acute intoxication and safety concerns.   Hospitalization Narrative:  The patient's current presentation of auditory hallucinations, tangential thought processes, acute passive suicidal ideation, and positive urine drug screen for amphetamines, fentanyl, and cocaine is most consistent with acute methamphetamine  intoxication. Patient is also likely to be experiencing opioid, methamphetamine, and cocaine withdrawal, as evidenced by her recent substance use and use of supportive PRN medications. Additionally, patient has a historical diagnoses of depression and PTSD.   Since  admission, pt was monitored for symptoms of withdrawal and was provided with symptomatic support with as needed medications, such as loperamide , baclofen, and Zyprexa (for agitation). Patient's physical symptoms were well-controlled without need for escalating requirements.    Throughout hospitalization, patient demonstrated improvement in auditory hallucinations, paranoia, and safety concerns. Patient has denied SI throughout her admission. Patient also reports improvement in sleep in the hospital. Patient has completed a safety plan. MD, staff, patient, patient's spouse, and patient's outpatient psychiatrist were in agreement with plan for discharge.   Psychiatric Follow Up Items:   # Acute intoxication # Polysubstance use disorder # Opioid withdrawal - Continue hydroxyzine  25mg  PRN every 6 hours for anxiety - Continue symptomatic support: Tylenol , loperamide , Miralax over the counter  Psychiatric aftercare plan:  - Follow up as needed  Medical: Admission workup included CMP, UA, UDS, EKG, and BAL, was remarkable for methamphetamine, cocaine, fentanyl on urine drug screen.  Medical conditions managed during this admission:   # Acute intoxication due to polysubstance use # Withdrawal - Managed as above  Medical Follow up items:  - Follow up with primary care provider as needed    Hospital Services Provided: Psychiatric physician and nursing services, case management, recreational therapy and occupational therapy. Multidisciplinary treatment plan(s) were established within 72 hours of admission; discussed daily and updated weekly. The patient had access to individual, group, and milieu therapeutic modalities.   Risk Assessment: On the day of discharge, the patient was evaluated by the attending physician and was discussed with the multidisciplinary treatment team. The treatment team has determined the patient to be stable and appropriate for discharge with medical approval.   Day of  discharge assessment included a suicide and violence risk assessment. Risk factors for self-harm/suicide that are present at time of discharge include: current substance abuse, past substance abuse, and chronic impulsivity.   A violence risk assessment was performed on the day of discharge. Risk factors for aggression/violence that are present at time of discharge include: current substance abuse and past substance abuse. These risk factors are mitigated by the following factors: lack of active SI/HI, no know access to weapons or firearms, no history of previous suicide attempts , and no history of violence. Furthermore, the treatment team has attempted to mitigate risk through supportive psychotherapy, providing psycho-education, thoughtful medication management, and communication with outpatient providers for continuity of care. The patient was educated about relevant modifiable risk factors including following recommendations for treatment of psychiatric illness and abstaining from substance abuse.    While future psychiatric events cannot be accurately predicted, the patient does not currently require further acute inpatient psychiatric care and does not currently meet   involuntary commitment criteria. It is recommended that the patient continue treatment in outpatient care. A follow up plan and crisis plan are in place, have been discussed with the patient, and the patient agrees to the plan at time of discharge.   The treatment team provided psycho-education and made recommendations regarding cessation of substance use, medication adherence, adaptive coping strategies, and healthy lifestyle modifications .    Recommendation for discharge safety planning included: removal of all firearms from the home, locking all sharps in a secure container, locking all weapons and dangerous objects in a secure container, and locking all medications  in a secure container.    Condition at Discharge:  fair Discharge Medications:    Your Medication List     STOP taking these medications    albuterol  90 mcg/actuation inhaler Commonly known as: PROVENTIL  HFA;VENTOLIN  HFA   QUEtiapine 200 MG tablet Commonly known as: SEROQUEL       START taking these medications    acetaminophen  325 MG tablet Commonly known as: TYLENOL  Take 2 tablets (650 mg total) by mouth every six (6) hours as needed.   hydrOXYzine  25 MG tablet Commonly known as: ATARAX  Take 1 tablet (25 mg total) by mouth every six (6) hours as needed for anxiety.   loperamide  2 mg capsule Commonly known as: IMODIUM  Take 1 capsule (2 mg total) by mouth every hour as needed (for diarrhea).   polyethylene glycol 17 gram packet Commonly known as: MIRALAX Take 17 g by mouth daily as needed.        Advanced Directives: Does patient have an advance directive covering medical treatment?: Patient does not have advance directive covering medical treatment. Reason patient does not have an advance directive covering medical treatment:: Patient does not wish to complete one at this time.   HCDM (patient stated preference): Abran Brunner - Brother - [phone] Healthcare Decision Maker: Patient does not wish to appoint a Health Care Decision Maker at this time Extended Emergency Contact Information Primary Emergency Contact: Perry,Joey  [place] Home Phone: [phone] Relation: Brother Information offered on HCDM, Medical & Mental Health advance directives:: Patient given information.  Does patient have an advance directive covering mental health treatment?: Patient does not have advance directive covering mental health treatment. Reason patient does not have an advance directive covering mental health treatment:: Patient does not wish to complete one at this time.  Discharge Instructions:   Other Instructions     Discharge instructions     HOSPITAL TRANSITION INFORMATION*: Reason for admission:  Patient is a 43yo female with a reported past psychiatric history of depression, PTSD, and unspecified psychosis who was admitted for safety concerns.   Discharge Diagnosis:  Active Problems:   Substance use disorder   Psychosis      Test Results: Data Review: First and last CBC / CBC with diff - Lab Results - Current Encounter      Component                Value               Date/Time                 WBC                      9.0                 05/23/2024 0859           RBC                      4.46                05/23/2024 0859           HGB                      11.7                05/23/2024 0859           HCT  34.6                05/23/2024 0859           PLT                      426                 05/23/2024 0859           NEUTROABS                6.9                 05/23/2024 0859           LYMPHOPCT                15.1                05/23/2024 0859           LYMPHSABS                1.3                 05/23/2024 0859           MONOPCT                  6.3                 05/23/2024 0859           MONOSABS                 0.6                 05/23/2024 0859           EOSPCT                   0.3                 05/23/2024 0859           EOSABS                   0.0                 05/23/2024 0859           BASOPCT                  0.8                 05/23/2024 0859           BASOSABS                 0.1                 05/23/2024 0859      First and last BMP/CMP-  Lab Results - Current Encounter      Component                Value               Date/Time                 NA                       141                 05/23/2024 0859  K                        3.4                 05/23/2024 0859           CL                       106                 05/23/2024 0859           CO2                      26.0                05/23/2024 0859           BUN                      <5 (L)              05/23/2024 0859           CREATININE               0.60                 05/23/2024 0859           GLU                      113                 05/23/2024 0859           CALCIUM                  9.6                 05/23/2024 0859           ALBUMIN                  4.1                 05/23/2024 0859           PROT                     7.8                 05/23/2024 0859           BILITOT                  0.3                 05/23/2024 0859           ALT                      11                  05/23/2024 0859           AST                      20                  05/23/2024 0859           ALKPHOS  81                  05/23/2024 0859      Toxicology screen- Lab Results - Current Encounter      Component                Value               Date/Time                 AMPHU                    Positive (A)        05/23/2024 0920           BARBU                    Negative            05/23/2024 0920           BENZU                    Negative            05/23/2024 0920           CANNAU                   Negative            05/23/2024 0920           COCAU                    Positive (A)        05/23/2024 0920           METHU                    Negative            05/23/2024 0920           OPIAU                    Negative            05/23/2024 0920      Urinalysis-  Lab Results - Current Encounter      Component                Value               Date/Time                 PHUR                     8.0                 05/23/2024 0920           SPECGRAV                 1.015               05/23/2024 0920           GLUCOSEU                 Negative            05/23/2024 0920           BLOODU                   Negative  05/23/2024 0920           KETONESU                 Negative            05/23/2024 0920           UROBILINOGEN             <2.0 mg/dL          92/84/7974 9079           BILIRUBINUR              Negative            05/23/2024 0920           LEUKOCYTESUR             Moderate (A)        05/23/2024 0920           NITRITE                   Negative            05/23/2024 0920      Imaging: Radiology report(s) reviewed.  Encounter Date: 05/23/24 -ECG 12 Lead:       Result                      Value                          EKG Systolic BP                                            EKG Diastolic BP                                           EKG Ventricular Rate        95                             EKG Atrial Rate             95                             EKG P-R Interval            118                            EKG QRS Duration            78                             EKG Q-T Interval            384                            EKG QTC Calculation         482  EKG Calculated P Axis       64                             EKG Calculated R Axis       63                             EKG Calculated T Axis       56                             QTC Fredericia              447                                                                                    Narrative   NORMAL SINUS RHYTHM  NONSPECIFIC ST ABNORMALITY  QTcB >= 480 msec  ABNORMAL ECG  WHEN COMPARED WITH ECG OF 19-May-2024 08:08,  VENT. RATE HAS INCREASED by  33 bpm  ST NOW DEPRESSED IN INFERIOR LEADS  Confirmed by Claudene Legions ([id number]) on 05/23/2024 4:27:55 PM   Pending Test Results: No pending test or studies  Advanced Directives: Does patient have an advance directive covering medical treatment?: Patient does not have advance directive covering medical treatment. Reason patient does not have an advance directive covering medical treatment:: Patient does not wish to complete one at this time.   HCDM (patient stated preference): Abran Brunner - Brother - [phone] Healthcare Decision Maker: Patient does not wish to appoint a Health Care Decision Maker at this time Extended Emergency Contact Information Primary Emergency Contact: Perry,Joey  [place] Home Phone: [phone] Relation: Brother Information offered on  HCDM, Medical & Mental Health advance directives:: Patient given information.  Does patient have an advance directive covering mental health treatment?: Patient does not have advance directive covering mental health treatment. Reason patient does not have an advance directive covering mental health treatment:: Patient does not wish to complete one at this time.   Discharge Unit 24-7 Contact Number: [phone] [facility]  DISCHARGE INSTRUCTIONS: -Please continue taking medications as prescribed.   -Please refrain from using illicit substances, as these can affect your mood and could cause anxiety or other concerning symptoms.  -- Your outpatient provider will monitor your labs as indicated  -As a condition of discharge, you agree to follow-up with appropriate outpatient psychiatric providers. You have been provided prescriptions for a limited supply of your medications. All refills will need to be handled by your outpatient provider.  -Do not make changes to your medications, including taking more or less than prescribed, unless under the supervision of your physician. Be aware that some medications may make you feel worse if abruptly stopped.  -Seek further medical care for any increase in symptoms or new symptoms, such as thoughts of wanting to hurt yourself, hurt others, or if you have increased agitation.  Suicide Prevention Resources: National Suicide Prevention Lifeline - after May 24, 2021, dial the 3 digit code 30. Prior to May 24, 2021,  call 1-800-273-TALK (8255) Spanish/Espaol: (813)177-1339  Crisis Text Line Text HOME to (206) 674-8701  Suicide Prevention Resource Center [employer]irm.com.ee  National Institutes of Health http://www.maynard.net/  Substance Abuse and Mental Health Services Administration SkateOasis.com.pt   -For immediate concerns, call 911 for emergency medical attention.  -Emergency services are available 24 hours a day at [facility] to get assistance in deciding appropriate care during  periods of psychiatric concern.  -Consider a psychiatric advanced directive at http://nrc-pad.org/     Follow Up instructions and Outpatient Referrals    Discharge instructions      Appointments which have been scheduled for you   [facility]: 8 am to 5 pm, Monday-Friday. Behavioral Health Urgent Care: 24/7  Address  [address] [place] [address]          Contact Information   [phone]  [phone]     To establish care with a psychiatrist and learn about available therapies, walk in for an assessment: Monday through Thursday 8:30am-1pm  [facility]  [address] [place], [place] [address]  Phone: [phone] Fax: [phone]        Patient was seen and plan of care was discussed with the Attending MD, Dr. Marget , who agrees with the above statement and plan.  I spent greater than 30 minutes in the discharge of this patient.  Lonell JINNY Pott, MD

## 2024-05-25 NOTE — Care Plan (Signed)
  Problem: Suicide Risk Goal: Absence of Self-Harm 05/25/2024 1235 by Clorinda Bonni Liv, RN Outcome: Discharged to Home 05/25/2024 1233 by Clorinda Bonni Liv, RN Outcome: Progressing Intervention: Assess Risk to Self and Maintain Safety Recent Flowsheet Documentation Taken 05/25/2024 1130 by Clorinda Bonni Liv, RN Behavior Management: . behavioral plan developed . behavioral plan reviewed Intervention: Promote Psychosocial Wellbeing Recent Flowsheet Documentation Taken 05/25/2024 0800 by Clorinda Bonni Liv, RN Sleep/Rest Enhancement: . consistent schedule promoted . regular sleep/rest pattern promoted   Problem: Adult Behavioral Health Plan of Care Goal: Plan of Care Review 05/25/2024 1235 by Clorinda Bonni Liv, RN Outcome: Discharged to Home 05/25/2024 1233 by Clorinda Bonni Liv, RN Outcome: Progressing Goal: Patient-Specific Goal (Individualization) 05/25/2024 1235 by Clorinda Bonni Liv, RN Outcome: Discharged to Home 05/25/2024 1233 by Clorinda Bonni Liv, RN Outcome: Progressing Goal: Adheres to Safety Considerations for Self and Others 05/25/2024 1235 by Clorinda Bonni Liv, RN Outcome: Discharged to Home 05/25/2024 1233 by Clorinda Bonni Liv, RN Outcome: Progressing Goal: Absence of New-Onset Illness or Injury 05/25/2024 1235 by Clorinda Bonni Liv, RN Outcome: Discharged to Home 05/25/2024 1233 by Clorinda Bonni Liv, RN Outcome: Progressing Intervention: Prevent Skin Injury Recent Flowsheet Documentation Taken 05/25/2024 0800 by Clorinda Bonni Liv, RN Device Skin Pressure Protection: adhesive use limited Skin Protection: adhesive use limited Goal: Optimized Coping Skills in Response to Life Stressors 05/25/2024 1235 by Clorinda Bonni Liv, RN Outcome: Discharged to Home 05/25/2024 1233 by Clorinda Bonni Liv, RN Outcome: Progressing Goal: Develops/Participates in Therapeutic Alliance to Support Successful  Transition 05/25/2024 1235 by Clorinda Bonni Liv, RN Outcome: Discharged to Home 05/25/2024 1233 by Clorinda Bonni Liv, RN Outcome: Progressing Goal: Rounds/Family Conference 05/25/2024 1235 by Clorinda Bonni Liv, RN Outcome: Discharged to Home 05/25/2024 1233 by Clorinda Bonni Liv, RN Outcome: Progressing

## 2024-07-07 ENCOUNTER — Other Ambulatory Visit: Payer: Self-pay

## 2024-07-07 ENCOUNTER — Emergency Department
Admission: EM | Admit: 2024-07-07 | Discharge: 2024-07-07 | Disposition: A | Discharge status: Home / Self Care | Payer: MEDICAID | Attending: Emergency Medicine | Admitting: Emergency Medicine

## 2024-07-07 DIAGNOSIS — Y908 Blood alcohol level of 240 mg/100 ml or more: Secondary | ICD-10-CM | POA: Insufficient documentation

## 2024-07-07 DIAGNOSIS — F10929 Alcohol use, unspecified with intoxication, unspecified: Secondary | ICD-10-CM

## 2024-07-07 DIAGNOSIS — F1910 Other psychoactive substance abuse, uncomplicated: Secondary | ICD-10-CM | POA: Diagnosis not present

## 2024-07-07 DIAGNOSIS — F10120 Alcohol abuse with intoxication, uncomplicated: Secondary | ICD-10-CM | POA: Diagnosis present

## 2024-07-07 DIAGNOSIS — E876 Hypokalemia: Secondary | ICD-10-CM | POA: Diagnosis not present

## 2024-07-07 LAB — BASIC METABOLIC PANEL WITH GFR
Anion gap: 12 (ref 5–15)
BUN: 9 mg/dL (ref 6–20)
CO2: 22 mmol/L (ref 22–32)
Calcium: 8.5 mg/dL — ABNORMAL LOW (ref 8.9–10.3)
Chloride: 106 mmol/L (ref 98–111)
Creatinine, Ser: 0.64 mg/dL (ref 0.44–1.00)
GFR, Estimated: >60 mL/min (ref >60)
Glucose, Bld: 108 mg/dL — ABNORMAL HIGH (ref 70–99)
Potassium: 2.9 mmol/L — ABNORMAL LOW (ref 3.5–5.1)
Sodium: 140 mmol/L (ref 135–145)

## 2024-07-07 LAB — URINE DRUG SCREEN, QUALITATIVE (ARMC ONLY)
Amphetamines, Ur Screen: NOT DETECTED
Barbiturates, Ur Screen: NOT DETECTED
Benzodiazepine, Ur Scrn: NOT DETECTED
Cannabinoid 50 Ng, Ur ~~LOC~~: POSITIVE — AB
Cocaine Metabolite,Ur ~~LOC~~: POSITIVE — AB
MDMA (Ecstasy)Ur Screen: NOT DETECTED
Methadone Scn, Ur: NOT DETECTED
Opiate, Ur Screen: NOT DETECTED
Phencyclidine (PCP) Ur S: NOT DETECTED
Tricyclic, Ur Screen: NOT DETECTED

## 2024-07-07 LAB — CBC WITH DIFFERENTIAL/PLATELET
Abs Immature Granulocytes: 0.03 K/uL (ref 0.00–0.07)
Basophils Absolute: 0.1 K/uL (ref 0.0–0.1)
Basophils Relative: 1 %
Eosinophils Absolute: 0.1 K/uL (ref 0.0–0.5)
Eosinophils Relative: 1 %
HCT: 36.1 % (ref 36.0–46.0)
Hemoglobin: 11.3 g/dL — ABNORMAL LOW (ref 12.0–15.0)
Immature Granulocytes: 0 %
Lymphocytes Relative: 17 %
Lymphs Abs: 1.5 K/uL (ref 0.7–4.0)
MCH: 25.6 pg — ABNORMAL LOW (ref 26.0–34.0)
MCHC: 31.3 g/dL (ref 30.0–36.0)
MCV: 81.9 fL (ref 80.0–100.0)
Monocytes Absolute: 0.4 K/uL (ref 0.1–1.0)
Monocytes Relative: 5 %
Neutro Abs: 6.4 K/uL (ref 1.7–7.7)
Neutrophils Relative %: 76 %
Platelets: 430 K/uL — ABNORMAL HIGH (ref 150–400)
RBC: 4.41 MIL/uL (ref 3.87–5.11)
RDW: 15.5 % (ref 11.5–15.5)
WBC: 8.5 K/uL (ref 4.0–10.5)
nRBC: 0 % (ref 0.0–0.2)

## 2024-07-07 LAB — PREGNANCY, URINE: Preg Test, Ur: NEGATIVE

## 2024-07-07 LAB — ETHANOL: Alcohol, Ethyl (B): 250 mg/dL — ABNORMAL HIGH (ref <15)

## 2024-07-07 LAB — MAGNESIUM: Magnesium: 2.4 mg/dL (ref 1.7–2.4)

## 2024-07-07 MED ORDER — POTASSIUM CHLORIDE 10 MEQ/100ML IV SOLN
10.0000 meq | INTRAVENOUS | Status: AC
  Administered 2024-07-07 (×3): 10 meq via INTRAVENOUS
  Filled 2024-07-07 (×3): qty 100

## 2024-07-07 NOTE — ED Notes (Signed)
 This RN called Tim with pts permission to discuss pt status and pick up for d/c.

## 2024-07-07 NOTE — ED Provider Notes (Signed)
-----------------------------------------   11:53 AM on 07/07/2024 ----------------------------------------- Patient is now awake and alert.  Is feeling well states she is ready to go home.  Patient's medical workup has been largely nonrevealing besides an elevated alcohol level 250 and a cocaine and cannabinoid positive urine drug screen.  Will discharge patient when she is able to get a ride home.   Dorothyann Drivers, MD 07/07/24 1154

## 2024-07-07 NOTE — ED Notes (Signed)
Bed alarm on at this time
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
## 2024-07-07 NOTE — ED Notes (Signed)
 Shannon Patel [phone]

## 2024-07-07 NOTE — Discharge Instructions (Addendum)
 You have been seen in the Emergency Department (ED) today for substance abuse.  You have been evaluated by the ED physician(s) and are being discharged with outpatient resources and follow up recommendations.  We recommend that you take an over-the-counter potassium supplement because your potassium level tonight was low.  Please return to the ED immediately if you have ANY thoughts of hurting yourself or anyone else, so that we may help you.  Please avoid alcohol and drug use.  Follow up with your doctor and/or therapist as soon as possible regarding today's ED  visit.

## 2024-07-16 ENCOUNTER — Emergency Department
Admission: EM | Admit: 2024-07-16 | Discharge: 2024-07-16 | Disposition: A | Discharge status: Home / Self Care | Payer: MEDICAID | Attending: Emergency Medicine | Admitting: Emergency Medicine

## 2024-07-16 ENCOUNTER — Other Ambulatory Visit: Payer: Self-pay

## 2024-07-16 DIAGNOSIS — X58XXXA Exposure to other specified factors, initial encounter: Secondary | ICD-10-CM | POA: Diagnosis not present

## 2024-07-16 DIAGNOSIS — T50901A Poisoning by unspecified drugs, medicaments and biological substances, accidental (unintentional), initial encounter: Secondary | ICD-10-CM | POA: Insufficient documentation

## 2024-07-16 LAB — CBC WITH DIFFERENTIAL/PLATELET
Abs Immature Granulocytes: 0.02 K/uL (ref 0.00–0.07)
Basophils Absolute: 0.1 K/uL (ref 0.0–0.1)
Basophils Relative: 1 %
Eosinophils Absolute: 0.1 K/uL (ref 0.0–0.5)
Eosinophils Relative: 2 %
HCT: 32.7 % — ABNORMAL LOW (ref 36.0–46.0)
Hemoglobin: 10.2 g/dL — ABNORMAL LOW (ref 12.0–15.0)
Immature Granulocytes: 0 %
Lymphocytes Relative: 26 %
Lymphs Abs: 1.6 K/uL (ref 0.7–4.0)
MCH: 25.6 pg — ABNORMAL LOW (ref 26.0–34.0)
MCHC: 31.2 g/dL (ref 30.0–36.0)
MCV: 82 fL (ref 80.0–100.0)
Monocytes Absolute: 0.3 K/uL (ref 0.1–1.0)
Monocytes Relative: 6 %
Neutro Abs: 4 K/uL (ref 1.7–7.7)
Neutrophils Relative %: 65 %
Platelets: 394 K/uL (ref 150–400)
RBC: 3.99 MIL/uL (ref 3.87–5.11)
RDW: 15 % (ref 11.5–15.5)
WBC: 6.1 K/uL (ref 4.0–10.5)
nRBC: 0 % (ref 0.0–0.2)

## 2024-07-16 LAB — BASIC METABOLIC PANEL WITH GFR
Anion gap: 11 (ref 5–15)
BUN: 9 mg/dL (ref 6–20)
CO2: 24 mmol/L (ref 22–32)
Calcium: 8.1 mg/dL — ABNORMAL LOW (ref 8.9–10.3)
Chloride: 102 mmol/L (ref 98–111)
Creatinine, Ser: 0.68 mg/dL (ref 0.44–1.00)
GFR, Estimated: >60 mL/min (ref >60)
Glucose, Bld: 142 mg/dL — ABNORMAL HIGH (ref 70–99)
Potassium: 3.3 mmol/L — ABNORMAL LOW (ref 3.5–5.1)
Sodium: 137 mmol/L (ref 135–145)

## 2024-07-16 LAB — ETHANOL: Alcohol, Ethyl (B): <15 mg/dL (ref <15)

## 2024-07-16 LAB — HCG, QUANTITATIVE, PREGNANCY: hCG, Beta Chain, Quant, S: 1 m[IU]/mL (ref <5)

## 2024-07-16 LAB — CK: Total CK: 83 U/L (ref 38–234)
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
## 2024-07-16 NOTE — Discharge Instructions (Signed)
You have been seen in the Emergency Department (ED) today for substance abuse.  You have been evaluated by the ED physician(s) and/or by the behavioral medicine specialists and are being discharged with outpatient resources and follow up recommendations.  Please return to the ED immediately if you have ANY thoughts of hurting yourself or anyone else, so that we may help you.  Please avoid alcohol and drug use.  Follow up with your doctor and/or therapist as soon as possible regarding today's ED  visit.   Please follow up any other recommendations and clinic appointments provided by the psychiatry team that saw you in the Emergency Department. 
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
## 2024-07-16 NOTE — ED Provider Notes (Signed)
 [facility] Provider Note    Event Date/Time   First MD Initiated Contact with Patient 07/16/24 [phone]     (approximate)   History   Drug Overdose   HPI Shannon Patel is a 43 y.o. female who presents by EMS after an apparent accidental overdose.  Her companion also came in and reports that they took a pill that might have been either gabapentin or heroin or fentanyl.  The patient was reportedly unresponsive and woke up after 2 mg of Narcan by first responders.  She is currently awake and alert though still somnolent and slurring her speech.  She states that she has nothing to live for anymore and does not care if she wakes up but she is not actively trying to kill herself.    Physical Exam   Triage Vital Signs: ED Triage Vitals  Encounter Vitals Group     BP 07/16/24 0056 118/74     Girls Systolic BP Percentile --      Girls Diastolic BP Percentile --      Boys Systolic BP Percentile --      Boys Diastolic BP Percentile --      Pulse Rate 07/16/24 0056 66     Resp 07/16/24 0056 10     Temp 07/16/24 0056 97.6 F (36.4 C)     Temp Source 07/16/24 0056 Oral     SpO2 07/16/24 0050 98 %     Weight 07/16/24 0057 59.9 kg (132 lb)     Height 07/16/24 0057 1.6 m (5' 3)     Head Circumference --      Peak Flow --      Pain Score 07/16/24 0056 0     Pain Loc --      Pain Education --      Exclude from Growth Chart --     Most recent vital signs: Vitals:   07/16/24 0445 07/16/24 0500  BP: 111/64 106/69  Pulse: 64 (!) 55  Resp: 16 13  Temp:    SpO2: 100% 100%    General: Awake, somnolent but responds to verbal and physical stimuli. CV:  Good peripheral perfusion.  Regular rate and rhythm, normal heart sounds. Resp:  Normal effort. Speaking easily and comfortably, no accessory muscle usage nor intercostal retractions.  Lungs clear to auscultation bilaterally. Abd:  No distention.  No tenderness to palpation of the abdomen. States a feeling of  hopelessness and that she has nothing to live for and is unclear about the intention of the overdose; she says she was trying to get hide but also that she feels like she has nothing to live for.  ED Results / Procedures / Treatments   Labs (all labs ordered are listed, but only abnormal results are displayed) Labs Reviewed  CBC WITH DIFFERENTIAL/PLATELET - Abnormal; Notable for the following components:      Result Value   Hemoglobin 10.2 (*)    HCT 32.7 (*)    MCH 25.6 (*)    All other components within normal limits  BASIC METABOLIC PANEL WITH GFR - Abnormal; Notable for the following components:   Potassium 3.3 (*)    Glucose, Bld 142 (*)    Calcium 8.1 (*)    All other components within normal limits  CK  ETHANOL  HCG, QUANTITATIVE, PREGNANCY  URINE DRUG SCREEN, QUALITATIVE ([facility] ONLY)  POC URINE PREG, ED     EKG  ED ECG REPORT I, Darleene Dome, the attending physician, personally  viewed and interpreted this ECG.  Date: 07/16/2024 EKG Time: 00: 54 Rate: 70 Rhythm: normal sinus rhythm QRS Axis: normal Intervals: normal, QTc 473 ms, QRS normal ST/T Wave abnormalities: normal Narrative Interpretation: no evidence of acute ischemia     PROCEDURES:  Critical Care performed: No  Procedures    IMPRESSION / MDM / ASSESSMENT AND PLAN / ED COURSE  I reviewed the triage vital signs and the nursing notes.                              Differential diagnosis includes, but is not limited to, intentional overdose, accidental overdose, electrolyte or metabolic abnormality.  Patient's presentation is most consistent with acute presentation with potential threat to life or bodily function.  Labs/studies ordered: Urine drug screen, point-of-care pregnancy test, CK, CBC with differential, basic metabolic panel, hCG  Interventions/Medications given:  Medications - No data to display  (Note:  hospital course my include additional interventions and/or labs/studies not  listed above.)   Patient on pulse oximeter in case the Narcan wears off and the probable opioids cause her to become apneic.  Vital signs are currently normal including SpO2 100%.  Metabolic panel and CBC and CK are all within normal limits.  Patient has not yet provided a urine drug screen but I reviewed prior ED visits and she has been here for polysubstance abuse and alcohol intoxication in the past.  Will monitor the patient for safety and anticipate discharge.  No indication for psychiatric evaluation and the patient does not meet inpatient psychiatric or IVC criteria, but she may benefit from speaking with psychiatry when she is a bit more sober.  We will monitor her for safety and consult psychiatry if she still wishes to do so when she is more awake.  Clinical Course as of 07/16/24 0530  [place] Jul 16, 2024  0201 HCG, Beta Chain, Quant, S: 1 [CF]  [phone] Patient is medically cleared after nearly 5 hours of observation in the emergency department.  She is awake and alert and ambulatory and ate a full meal.  I had another conversation with her when she continued to express vague thoughts of hopelessness and she clarified that tonight's overdose was accidental but she would not be sad if she did not wake up.  I talked with her about whether she would benefit from psychiatric consultation and at first she thought she would, but then she changed her mind and decided that she could not stand to be separated from her boyfriend who is in the room with her and she just wants to go.  I continue to feel that she maintains her decision-making capacity and that she does not represent an immediate danger to herself or others.  She does not meet IVC criteria and is appropriate for discharge and outpatient follow-up.  I gave her extensive outpatient resources including residential and outpatient counseling centers and she understands and agrees with the plan.  She knows she can return at any time for additional  evaluation. [CF]    Clinical Course User Index [CF] Gordan Huxley, MD     FINAL CLINICAL IMPRESSION(S) / ED DIAGNOSES   Final diagnoses:  Accidental overdose, initial encounter     Rx / DC Orders   ED Discharge Orders     None        Note:  This document was prepared using Dragon voice recognition software and may include unintentional dictation errors.  Gordan Huxley, MD 07/16/24 0530

## 2024-07-16 NOTE — ED Notes (Signed)
 Pt ambulated to toilet without difficulty. RN at bedside while pt attempting to urinate. Pt denies pain at this time.

## 2024-07-16 NOTE — ED Notes (Addendum)
 When discharging pt. Pt was asking for resources to get disability. RN informed pt that all of those resources would be in her discharge paperwork. RN informed pt that the ER does not fill out disability paperwork and that she needs to follow up with her primary care provider.  PT in no acute distress prior to discharge. Discharged instructions reviewed and pt stated that they understand directions. Pt has all belongings with them at time of discharge. Pt given resources for outpatient follow up.

## 2024-07-16 NOTE — ED Notes (Signed)
 Pt up to toilet to attempt to pee. Pt eating and drinking without difficulty.

## 2024-08-30 ENCOUNTER — Other Ambulatory Visit: Payer: Self-pay

## 2024-08-30 DIAGNOSIS — F1910 Other psychoactive substance abuse, uncomplicated: Secondary | ICD-10-CM | POA: Insufficient documentation

## 2024-08-30 DIAGNOSIS — T7411XA Adult physical abuse, confirmed, initial encounter: Secondary | ICD-10-CM | POA: Insufficient documentation

## 2024-08-30 DIAGNOSIS — F4310 Post-traumatic stress disorder, unspecified: Secondary | ICD-10-CM | POA: Insufficient documentation

## 2024-08-30 DIAGNOSIS — S70311A Abrasion, right thigh, initial encounter: Secondary | ICD-10-CM | POA: Insufficient documentation

## 2024-08-30 DIAGNOSIS — F319 Bipolar disorder, unspecified: Secondary | ICD-10-CM | POA: Insufficient documentation

## 2024-08-30 DIAGNOSIS — F201 Disorganized schizophrenia: Secondary | ICD-10-CM | POA: Insufficient documentation

## 2024-08-30 DIAGNOSIS — Z79899 Other long term (current) drug therapy: Secondary | ICD-10-CM | POA: Insufficient documentation

## 2024-08-30 DIAGNOSIS — F29 Unspecified psychosis not due to a substance or known physiological condition: Secondary | ICD-10-CM | POA: Insufficient documentation

## 2024-08-30 DIAGNOSIS — S0081XA Abrasion of other part of head, initial encounter: Secondary | ICD-10-CM | POA: Insufficient documentation

## 2024-08-30 DIAGNOSIS — N939 Abnormal uterine and vaginal bleeding, unspecified: Secondary | ICD-10-CM | POA: Insufficient documentation

## 2024-08-30 DIAGNOSIS — X58XXXA Exposure to other specified factors, initial encounter: Secondary | ICD-10-CM | POA: Insufficient documentation

## 2024-08-30 DIAGNOSIS — S70312A Abrasion, left thigh, initial encounter: Secondary | ICD-10-CM | POA: Insufficient documentation

## 2024-08-30 DIAGNOSIS — Z5900 Homelessness unspecified: Secondary | ICD-10-CM | POA: Insufficient documentation

## 2024-08-30 NOTE — ED Triage Notes (Signed)
 Pt reports she thinks she has a tampon in her vagina that she placed aprox 2 days ago

## 2024-08-31 ENCOUNTER — Emergency Department
Admission: EM | Admit: 2024-08-31 | Discharge: 2024-08-31 | Disposition: A | Discharge status: Other Acute Inpatient Hospital | Payer: MEDICAID | Attending: Emergency Medicine | Admitting: Emergency Medicine

## 2024-08-31 DIAGNOSIS — R4189 Other symptoms and signs involving cognitive functions and awareness: Secondary | ICD-10-CM

## 2024-08-31 DIAGNOSIS — N939 Abnormal uterine and vaginal bleeding, unspecified: Secondary | ICD-10-CM

## 2024-08-31 DIAGNOSIS — Z5900 Homelessness unspecified: Secondary | ICD-10-CM

## 2024-08-31 DIAGNOSIS — F1910 Other psychoactive substance abuse, uncomplicated: Secondary | ICD-10-CM

## 2024-08-31 DIAGNOSIS — F29 Unspecified psychosis not due to a substance or known physiological condition: Secondary | ICD-10-CM

## 2024-08-31 DIAGNOSIS — T7491XA Unspecified adult maltreatment, confirmed, initial encounter: Secondary | ICD-10-CM

## 2024-08-31 DIAGNOSIS — F14922 Cocaine use, unspecified with intoxication with perceptual disturbance: Secondary | ICD-10-CM

## 2024-08-31 DIAGNOSIS — F14959 Cocaine use, unspecified with cocaine-induced psychotic disorder, unspecified: Secondary | ICD-10-CM

## 2024-08-31 LAB — URINALYSIS, W/ REFLEX TO CULTURE (INFECTION SUSPECTED)
Bacteria, UA: NONE SEEN
Bilirubin Urine: NEGATIVE
Glucose, UA: NEGATIVE mg/dL
Ketones, ur: NEGATIVE mg/dL
Leukocytes,Ua: NEGATIVE
Nitrite: NEGATIVE
Protein, ur: NEGATIVE mg/dL
Specific Gravity, Urine: 1.005 (ref 1.005–1.030)
pH: 7 (ref 5.0–8.0)

## 2024-08-31 LAB — HEPATITIS PANEL, ACUTE
HCV Ab: NONREACTIVE
Hep A IgM: NONREACTIVE
Hep B C IgM: NONREACTIVE
Hepatitis B Surface Ag: NONREACTIVE

## 2024-08-31 LAB — COMPREHENSIVE METABOLIC PANEL WITH GFR
ALT: 13 U/L (ref 0–44)
AST: 21 U/L (ref 15–41)
Albumin: 3.7 g/dL (ref 3.5–5.0)
Alkaline Phosphatase: 54 U/L (ref 38–126)
Anion gap: 6 (ref 5–15)
BUN: 9 mg/dL (ref 6–20)
CO2: 28 mmol/L (ref 22–32)
Calcium: 8.7 mg/dL — ABNORMAL LOW (ref 8.9–10.3)
Chloride: 102 mmol/L (ref 98–111)
Creatinine, Ser: 0.54 mg/dL (ref 0.44–1.00)
GFR, Estimated: >60 mL/min (ref >60)
Glucose, Bld: 96 mg/dL (ref 70–99)
Potassium: 3.2 mmol/L — ABNORMAL LOW (ref 3.5–5.1)
Sodium: 136 mmol/L (ref 135–145)
Total Bilirubin: 0.4 mg/dL (ref 0.0–1.2)
Total Protein: 7.5 g/dL (ref 6.5–8.1)

## 2024-08-31 LAB — CBC WITH DIFFERENTIAL/PLATELET
Abs Immature Granulocytes: 0.02 K/uL (ref 0.00–0.07)
Basophils Absolute: 0.1 K/uL (ref 0.0–0.1)
Basophils Relative: 1 %
Eosinophils Absolute: 0.5 K/uL (ref 0.0–0.5)
Eosinophils Relative: 7 %
HCT: 36.3 % (ref 36.0–46.0)
Hemoglobin: 11.4 g/dL — ABNORMAL LOW (ref 12.0–15.0)
Immature Granulocytes: 0 %
Lymphocytes Relative: 36 %
Lymphs Abs: 2.4 K/uL (ref 0.7–4.0)
MCH: 25.1 pg — ABNORMAL LOW (ref 26.0–34.0)
MCHC: 31.4 g/dL (ref 30.0–36.0)
MCV: 79.8 fL — ABNORMAL LOW (ref 80.0–100.0)
Monocytes Absolute: 0.6 K/uL (ref 0.1–1.0)
Monocytes Relative: 10 %
Neutro Abs: 3.2 K/uL (ref 1.7–7.7)
Neutrophils Relative %: 46 %
Platelets: 435 K/uL — ABNORMAL HIGH (ref 150–400)
RBC: 4.55 MIL/uL (ref 3.87–5.11)
RDW: 15.2 % (ref 11.5–15.5)
Smear Review: NORMAL
WBC: 6.8 K/uL (ref 4.0–10.5)
nRBC: 0 % (ref 0.0–0.2)

## 2024-08-31 LAB — ETHANOL: Alcohol, Ethyl (B): <15 mg/dL (ref <15)

## 2024-08-31 LAB — WET PREP, GENITAL
Clue Cells Wet Prep HPF POC: NONE SEEN
Sperm: NONE SEEN
Trich, Wet Prep: NONE SEEN
WBC, Wet Prep HPF POC: <10 (ref <10)
Yeast Wet Prep HPF POC: NONE SEEN

## 2024-08-31 LAB — URINE DRUG SCREEN, QUALITATIVE (ARMC ONLY)
Amphetamines, Ur Screen: NOT DETECTED
Barbiturates, Ur Screen: NOT DETECTED
Benzodiazepine, Ur Scrn: NOT DETECTED
Cannabinoid 50 Ng, Ur ~~LOC~~: NOT DETECTED
Cocaine Metabolite,Ur ~~LOC~~: POSITIVE — AB
MDMA (Ecstasy)Ur Screen: NOT DETECTED
Methadone Scn, Ur: NOT DETECTED
Opiate, Ur Screen: NOT DETECTED
Phencyclidine (PCP) Ur S: NOT DETECTED
Tricyclic, Ur Screen: NOT DETECTED

## 2024-08-31 LAB — POC URINE PREG, ED: Preg Test, Ur: NEGATIVE

## 2024-08-31 LAB — HIV ANTIBODY (ROUTINE TESTING W REFLEX): HIV Screen 4th Generation wRfx: NONREACTIVE

## 2024-08-31 LAB — CHLAMYDIA/NGC RT PCR (ARMC ONLY)
Chlamydia Tr: NOT DETECTED
N gonorrhoeae: NOT DETECTED

## 2024-08-31 LAB — RPR: RPR Ser Ql: NONREACTIVE

## 2024-08-31 MED ORDER — DIAZEPAM 5 MG/ML IJ SOLN: 5.0000 mg | Freq: Four times a day (QID) | INTRAMUSCULAR | Status: DC | PRN

## 2024-08-31 MED ORDER — LORAZEPAM 1 MG PO TABS: 1.0000 mg | ORAL_TABLET | ORAL | Status: DC | PRN

## 2024-08-31 MED ORDER — ZIPRASIDONE MESYLATE 20 MG IM SOLR: 5.0000 mg | Freq: Four times a day (QID) | INTRAMUSCULAR | Status: DC | PRN

## 2024-08-31 NOTE — ED Notes (Signed)
 Pelvic cart at bedside.
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
## 2024-08-31 NOTE — Progress Notes (Signed)
 Per [facility], no appropriate beds are available within [facility] system. Patient has been referred to the following facilities:  Service Provider Phone  [facility]  [phone]  [facility]  [phone]  [facility]  [phone]  [facility]  [phone]  [facility]  [phone]  [facility]  [phone]  [facility]  [phone]  [place] [facility]FA[facility]  [phone]  [facility]  [phone]  [facility]  [phone]  [facility]  [phone]  [facility]  [phone]  [facility]  [phone]  [facility]  [phone]  [facility]  [address], [place] [phone]

## 2024-08-31 NOTE — ED Notes (Signed)
 Lunch provided at bedside

## 2024-08-31 NOTE — Discharge Instructions (Signed)
 Residential Substance Use Treatment Services   [facility]  [address]  [place], [place] [address]  [phone] or [phone] Detox (Medicare, Medicaid, private insurance, and self pay)  Residential Rehab 14 days (Medicare, [place], private insurance, and self pay)   [facility])  [address] [place], [place]  [phone]  Female and Female Detox (Self Pay and Medicaid limited availability)  Rehab only Female ([place] and self pay only)   [facility] [address]      [address]  [place], [place] [address]  [phone] or [phone] Detox and Residential Treatment Private Insurance Only   [facility]  [address] [place]  [place], [place] [address]  [phone]  Treatment Only, must make assessment appointment, and must be sober for assessment appointment.  Self Pay Only, Medicare A&B, [facility], [place] ID only! *Transportation assistance offered from [place] [employer]     [address] [place], [place] [address] Walk in interviews M-Sat 8-4p No pending legal charges [phone]     ADATC:  [facility] Referral  [address] [place], [place] [phone] (Self Pay, [facility])  [facility] [address] [place], [place] [address] [phone] Detox and Residential Treatment Medicare and Private Insurance  [facility] [address]  [place], [place] [address] 28 Day Women's Facility: [phone] 28 Day Men's Facility: [phone] Long-term Residential Program:  [phone] Males 25 and Over (No Insurance, upfront fee)  [facility]  [address] [place], [place] [address] [phone] Private Insurance with [employer], Private Pay  [facility] [address] [place], [place] [address] Local [phone] Private Insurance Only  [facility] [address]  [place], [place] [address]  [phone] (Males, upfront fee)  [facility] [address]  [place], [address] [phone] Private Insurance

## 2024-08-31 NOTE — ED Notes (Addendum)
 This RN attempted to give report x2 to staff at [facility]. Initial call went to voicemail after being transferred to accepting staff. 2nd call, this RN was informed that staff was unavailable at this time.

## 2024-08-31 NOTE — ED Notes (Addendum)
 Report received from Zack RN and Jeanna RN

## 2024-08-31 NOTE — ED Provider Notes (Signed)
 Reassessed at time of discharge to facility.  Stable at time of discharge.  Asking to call her husband.   Suzanne Kirsch, MD 08/31/24 [phone]

## 2024-08-31 NOTE — BH Assessment (Signed)
 This Clinical research associate contacted [facility] via phone to request an assessment, request has been made, assessment is currently pending

## 2024-08-31 NOTE — ED Provider Notes (Signed)
 Patient has been accepted to [facility] for 08/31/24. Patient was assigned to Emerson C. Accepting physician is Dr. Lois Peeks.   ----------------------------------------- 10:18 AM on 08/31/2024 ----------------------------------------- Patient resting sleeping comfortably.  Arouses easily to voice sits up conversant.  She is understanding of plan to transfer to [facility].  She has no concerns agreeable with plan.  She is stable for transfer at this time resting comfortably calm     Dicky Anes, MD 08/31/24 1019

## 2024-08-31 NOTE — ED Notes (Signed)
 Pt provided with breakfast tray and beverage at bedside

## 2024-08-31 NOTE — Progress Notes (Signed)
  Patient has been accepted to [facility] for 08/31/24. Patient was assigned to Emerson C. Accepting physician is Dr. Lois Peeks. Call report to [phone]. Representative was Ni-Yarna.   ER Staff is aware of it: Olam, ER Secretary Dr. Dicky, ER MD Milo PEAK, Patient's Nurse   Address:  [address]                 [place], [place] [address]
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
## 2024-08-31 NOTE — ED Notes (Signed)
 IVC /consult to [facility] pending

## 2024-08-31 NOTE — ED Notes (Signed)
 EMTALA reviewed by this RN.
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
## 2024-08-31 NOTE — ED Notes (Signed)
 Personal items secured, bagged, tagged and placed on the cart:  Black purse with sequins Small pink makeup bag with make up inside Samsung cell phone in a black close case with glitter sequins  [place] fold wallet with NCDL, Ins card, bank cards, food stamp card and some change Sweatshirt, shirt, tank top, underwear, pants, sandals Hair band and belly button ring in plastic container

## 2024-08-31 NOTE — ED Notes (Signed)
 This RN attempted report again, which was unsuccessful.

## 2024-09-27 ENCOUNTER — Ambulatory Visit: Payer: MEDICAID

## 2024-09-27 DIAGNOSIS — B3731 Acute candidiasis of vulva and vagina: Secondary | ICD-10-CM

## 2024-09-27 DIAGNOSIS — Z113 Encounter for screening for infections with a predominantly sexual mode of transmission: Secondary | ICD-10-CM

## 2024-09-27 DIAGNOSIS — Z3202 Encounter for pregnancy test, result negative: Secondary | ICD-10-CM

## 2024-09-27 LAB — WET PREP FOR TRICH, YEAST, CLUE
Clue Cell Exam: POSITIVE — AB
Trichomonas Exam: NEGATIVE

## 2024-09-27 LAB — PREGNANCY, URINE: Preg Test, Ur: NEGATIVE

## 2024-09-27 LAB — HM HEPATITIS C SCREENING LAB: HM Hepatitis Screen: NEGATIVE

## 2024-09-27 LAB — HM HIV SCREENING LAB: HM HIV Screening: NEGATIVE

## 2024-09-27 MED ORDER — FLUCONAZOLE 150 MG PO TABS: 150.0000 mg | ORAL_TABLET | Freq: Once | ORAL | Status: AC
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
## 2024-09-27 NOTE — Progress Notes (Signed)
 Pt is here for STD screening. Wet prep results reviewed with patient. The patient was dispensed Diflucan  150 mg tablet once. I provided counseling today regarding the medication. We discussed the medication, the side effects and when to call clinic. Patient given the opportunity to ask questions for any clarifications. Questions answered. Condoms and brochures given. Wilkie Drought, RN.

## 2024-09-28 ENCOUNTER — Emergency Department
Admission: EM | Admit: 2024-09-28 | Discharge: 2024-09-28 | Disposition: A | Discharge status: Home / Self Care | Attending: Emergency Medicine | Admitting: Emergency Medicine

## 2024-09-28 ENCOUNTER — Other Ambulatory Visit: Payer: Self-pay

## 2024-09-28 ENCOUNTER — Emergency Department

## 2024-09-28 DIAGNOSIS — S199XXA Unspecified injury of neck, initial encounter: Secondary | ICD-10-CM | POA: Diagnosis not present

## 2024-09-28 DIAGNOSIS — R45851 Suicidal ideations: Secondary | ICD-10-CM

## 2024-09-28 DIAGNOSIS — S0990XA Unspecified injury of head, initial encounter: Secondary | ICD-10-CM | POA: Insufficient documentation

## 2024-09-28 LAB — CBC
HCT: 33.6 % — ABNORMAL LOW (ref 36.0–46.0)
Hemoglobin: 10.6 g/dL — ABNORMAL LOW (ref 12.0–15.0)
MCH: 24.6 pg — ABNORMAL LOW (ref 26.0–34.0)
MCHC: 31.5 g/dL (ref 30.0–36.0)
MCV: 78 fL — ABNORMAL LOW (ref 80.0–100.0)
Platelets: 445 K/uL — ABNORMAL HIGH (ref 150–400)
RBC: 4.31 MIL/uL (ref 3.87–5.11)
RDW: 16.4 % — ABNORMAL HIGH (ref 11.5–15.5)
WBC: 5.9 K/uL (ref 4.0–10.5)
nRBC: 0 % (ref 0.0–0.2)

## 2024-09-28 LAB — COMPREHENSIVE METABOLIC PANEL WITH GFR
ALT: 9 U/L (ref 0–44)
AST: 18 U/L (ref 15–41)
Albumin: 4.2 g/dL (ref 3.5–5.0)
Alkaline Phosphatase: 67 U/L (ref 38–126)
Anion gap: 12 (ref 5–15)
BUN: 9 mg/dL (ref 6–20)
CO2: 24 mmol/L (ref 22–32)
Calcium: 8.9 mg/dL (ref 8.9–10.3)
Chloride: 104 mmol/L (ref 98–111)
Creatinine, Ser: 0.67 mg/dL (ref 0.44–1.00)
GFR, Estimated: >60 mL/min (ref >60)
Glucose, Bld: 107 mg/dL — ABNORMAL HIGH (ref 70–99)
Potassium: 3.6 mmol/L (ref 3.5–5.1)
Sodium: 140 mmol/L (ref 135–145)
Total Bilirubin: 0.3 mg/dL (ref 0.0–1.2)
Total Protein: 7.1 g/dL (ref 6.5–8.1)

## 2024-09-28 LAB — ACETAMINOPHEN LEVEL: Acetaminophen (Tylenol), Serum: <10 ug/mL — ABNORMAL LOW (ref 10–30)

## 2024-09-28 LAB — SALICYLATE LEVEL: Salicylate Lvl: <7 mg/dL — ABNORMAL LOW (ref 7.0–30.0)

## 2024-09-28 LAB — ETHANOL: Alcohol, Ethyl (B): <15 mg/dL (ref <15)

## 2024-09-28 MED ORDER — GABAPENTIN 300 MG PO CAPS: 800.0000 mg | ORAL_CAPSULE | Freq: Every morning | ORAL | Status: DC

## 2024-09-28 MED ORDER — TRAZODONE HCL 50 MG PO TABS: 50.0000 mg | ORAL_TABLET | Freq: Every day | ORAL | Status: DC

## 2024-09-28 MED ORDER — NICOTINE 14 MG/24HR TD PT24
14.0000 mg | MEDICATED_PATCH | Freq: Every day | TRANSDERMAL | Status: DC
  Administered 2024-09-28: 14 mg via TRANSDERMAL
  Filled 2024-09-28: qty 1

## 2024-09-28 MED ORDER — ARIPIPRAZOLE 5 MG PO TABS
5.0000 mg | ORAL_TABLET | Freq: Every day | ORAL | Status: DC
  Administered 2024-09-28: 5 mg via ORAL
  Filled 2024-09-28: qty 1

## 2024-09-28 MED ORDER — HYDROXYZINE HCL 25 MG PO TABS: 25.0000 mg | ORAL_TABLET | Freq: Four times a day (QID) | ORAL | Status: DC | PRN

## 2024-09-28 MED ORDER — CARBAMAZEPINE 100 MG PO CHEW
100.0000 mg | CHEWABLE_TABLET | Freq: Two times a day (BID) | ORAL | Status: DC
  Filled 2024-09-28: qty 1

## 2024-09-28 MED ORDER — ACETAMINOPHEN 500 MG PO TABS
1000.0000 mg | ORAL_TABLET | Freq: Once | ORAL | Status: AC
  Administered 2024-09-28: 1000 mg via ORAL
  Filled 2024-09-28: qty 2

## 2024-09-28 MED ORDER — IBUPROFEN 600 MG PO TABS
600.0000 mg | ORAL_TABLET | Freq: Once | ORAL | Status: AC
  Administered 2024-09-28: 600 mg via ORAL
  Filled 2024-09-28: qty 1

## 2024-09-28 MED ORDER — METHOCARBAMOL 500 MG PO TABS
500.0000 mg | ORAL_TABLET | Freq: Three times a day (TID) | ORAL | 0 refills | Status: AC | PRN
Start: 2024-09-28 — End: ?

## 2024-09-28 MED ORDER — METHOCARBAMOL 500 MG PO TABS
500.0000 mg | ORAL_TABLET | Freq: Once | ORAL | Status: AC
  Administered 2024-09-28: 500 mg via ORAL
  Filled 2024-09-28: qty 1

## 2024-09-28 NOTE — Discharge Instructions (Signed)
Please take Tylenol and ibuprofen/Advil for your pain.  It is safe to take them together, or to alternate them every few hours.  Take up to 1000mg  of Tylenol at a time, up to 4 times per day.  Do not take more than 4000 mg of Tylenol in 24 hours.  For ibuprofen, take 400-600 mg, 3 - 4 times per day.  Use Robaxin muscle relaxer as needed for more severe/breakthrough pain, up to 3 times per day. This medication can make some people sleepy, so do not use while driving, working or Designer, television/film set

## 2024-09-28 NOTE — SANE Note (Signed)
 The SANE/FNE RN is aware of the patient, and will be in to evaluate the patient after TTS evaluates the patient.

## 2024-09-28 NOTE — ED Notes (Signed)
 Pt discharged at this time. RN reviewed discharge instructions with pt. Pt verbalized understanding. RR even and unlabored. Pt denies any questions or needs at this time. Pt ambulatory at discharge. Pt given bus pass to pt.

## 2024-09-28 NOTE — ED Notes (Signed)
 SANE Called per Nurse burnard RN
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
## 2024-09-28 NOTE — Consult Note (Signed)
 Patient noted to be lying in bed with eyes closed when psychiatry entered room. Psychiatry attempted to assess patient. Patient refused to participate in assessment due to her neck hurting. This provider asked if we could continue with assessment while she was waiting on medications to work and patient stated no, my neck hurts, I can't. Psychiatry will attempt to assess patient later. ED nurse aware.

## 2024-09-28 NOTE — Progress Notes (Signed)
 1:20PM:  Per Zelda NP,  [facility] made several unsuccessful attempts to contact patient's grandmother Jules Potters - [phone]) for collateral.  Michial Skeen, [facility] [phone]

## 2024-09-28 NOTE — ED Notes (Signed)
 Meal provided

## 2024-09-28 NOTE — ED Notes (Addendum)
 Pt belongings:  Black shoes Blue jeans Purple socks White jacket [employer]
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
## 2024-09-28 NOTE — ED Provider Notes (Signed)
 Patient evaluated by psychiatry, SANE and social work has cleared the patient for outpatient management   Claudene Rover, MD 09/28/24 1425

## 2024-09-28 NOTE — ED Notes (Signed)
 Pt given breakfast tray and requesting pain medication for 8/10 neck pain. EDP made aware.
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
## 2024-09-28 NOTE — ED Notes (Signed)
 VOL, SANE to see

## 2024-09-28 NOTE — ED Notes (Addendum)
 Pt to the front desk reports she is going to kill herself waiting in the lobby. Pt brought back to triage room to be dressed out in psych appropriate clothing.
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
## 2024-09-28 NOTE — ED Notes (Signed)
 Pt requesting SANE evaluation
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
## 2024-09-28 NOTE — ED Notes (Addendum)
 SANE nurse paged by secretary at this time

## 2024-09-29 LAB — HBV ANTIGEN/ANTIBODY STATE LAB
Hep B Core Total Ab: NONREACTIVE
Hep B S Ab: REACTIVE
Hepatitis B Surface Ag: NONREACTIVE

## 2024-10-02 LAB — GONOCOCCUS CULTURE

## 2024-10-07 ENCOUNTER — Emergency Department
Admission: EM | Admit: 2024-10-07 | Discharge: 2024-10-07 | Disposition: A | Discharge status: Home / Self Care | Attending: Emergency Medicine | Admitting: Emergency Medicine

## 2024-10-07 ENCOUNTER — Other Ambulatory Visit: Payer: Self-pay

## 2024-10-07 DIAGNOSIS — T40601A Poisoning by unspecified narcotics, accidental (unintentional), initial encounter: Secondary | ICD-10-CM | POA: Insufficient documentation

## 2024-10-07 LAB — BASIC METABOLIC PANEL WITH GFR
Anion gap: 12 (ref 5–15)
BUN: 9 mg/dL (ref 6–20)
CO2: 26 mmol/L (ref 22–32)
Calcium: 8.4 mg/dL — ABNORMAL LOW (ref 8.9–10.3)
Chloride: 106 mmol/L (ref 98–111)
Creatinine, Ser: 0.62 mg/dL (ref 0.44–1.00)
GFR, Estimated: >60 mL/min (ref >60)
Glucose, Bld: 124 mg/dL — ABNORMAL HIGH (ref 70–99)
Potassium: 3.2 mmol/L — ABNORMAL LOW (ref 3.5–5.1)
Sodium: 144 mmol/L (ref 135–145)

## 2024-10-07 LAB — CBC WITH DIFFERENTIAL/PLATELET
Abs Immature Granulocytes: 0.02 K/uL (ref 0.00–0.07)
Basophils Absolute: 0.1 K/uL (ref 0.0–0.1)
Basophils Relative: 1 %
Eosinophils Absolute: 0.2 K/uL (ref 0.0–0.5)
Eosinophils Relative: 3 %
HCT: 36.4 % (ref 36.0–46.0)
Hemoglobin: 11.4 g/dL — ABNORMAL LOW (ref 12.0–15.0)
Immature Granulocytes: 0 %
Lymphocytes Relative: 21 %
Lymphs Abs: 1.4 K/uL (ref 0.7–4.0)
MCH: 24.8 pg — ABNORMAL LOW (ref 26.0–34.0)
MCHC: 31.3 g/dL (ref 30.0–36.0)
MCV: 79.3 fL — ABNORMAL LOW (ref 80.0–100.0)
Monocytes Absolute: 0.4 K/uL (ref 0.1–1.0)
Monocytes Relative: 7 %
Neutro Abs: 4.5 K/uL (ref 1.7–7.7)
Neutrophils Relative %: 68 %
Platelets: 370 K/uL (ref 150–400)
RBC: 4.59 MIL/uL (ref 3.87–5.11)
RDW: 16.2 % — ABNORMAL HIGH (ref 11.5–15.5)
WBC: 6.7 K/uL (ref 4.0–10.5)
nRBC: 0 % (ref 0.0–0.2)

## 2024-10-07 NOTE — ED Notes (Addendum)
 IV removed. D/c instructions reviewed w/ pt. Pt given socks & cola to drink.

## 2024-10-07 NOTE — ED Provider Notes (Signed)
 [name] Provider Note    Event Date/Time   First MD Initiated Contact with Patient 10/07/24 1753     (approximate)   History   Drug Overdose   HPI  Shannon Patel is a 43 y.o. female with history of substance use, presenting with fentanyl overdose.  Patient denies taking anything.  Denies any SI or HI.  States that she is asymptomatic at this time.  No other complaints.  Per independent history from EMS, she was witnessed taking fentanyl, was found unconscious, was given 1 mg of Narcan by EMS with improvement to her mental status, was also given 100 cc of fluids.  Was noted to be normotensive for them.     Physical Exam   Triage Vital Signs: ED Triage Vitals [10/07/24 1754]  Encounter Vitals Group     BP      Girls Systolic BP Percentile      Girls Diastolic BP Percentile      Boys Systolic BP Percentile      Boys Diastolic BP Percentile      Pulse      Resp      Temp      Temp src      SpO2      Weight 120 lb (54.4 kg)     Height 5' 3 (1.6 m)     Head Circumference      Peak Flow      Pain Score 0     Pain Loc      Pain Education      Exclude from Growth Chart     Most recent vital signs: Vitals:   10/07/24 1800  BP: 114/85  Pulse: 77  Resp: 12  Temp: 98.1 F (36.7 C)  SpO2: 100%     General: Awake, no distress.  CV:  Good peripheral perfusion.  Resp:  Normal effort.  Tachypnea respiratory distress Abd:  No distention.  Soft nontender Other:  Move all 4 extremities, pupils are equal, no slurred speech or facial droop   ED Results / Procedures / Treatments   Labs (all labs ordered are listed, but only abnormal results are displayed) Labs Reviewed  CBC WITH DIFFERENTIAL/PLATELET - Abnormal; Notable for the following components:      Result Value   Hemoglobin 11.4 (*)    MCV 79.3 (*)    MCH 24.8 (*)    RDW 16.2 (*)    All other components within normal limits  BASIC METABOLIC PANEL WITH GFR - Abnormal; Notable  for the following components:   Potassium 3.2 (*)    Glucose, Bld 124 (*)    Calcium 8.4 (*)    All other components within normal limits     EKG  EKG shows, sinus rhythm, rate 76, normal QS, normal QTc, besides wandering due to patient movement, no obvious ischemic ST elevation, not significantly compared to prior    PROCEDURES:  Critical Care performed: No  Procedures   MEDICATIONS ORDERED IN ED: Medications - No data to display   IMPRESSION / MDM / ASSESSMENT AND PLAN / ED COURSE  I reviewed the triage vital signs and the nursing notes.                              Differential diagnosis includes, but is not limited to, opiate overdose, electrolyte derangements.  She denies any SI.  Will monitor here for 3 hours after  Narcan.  Will get basic labs and EKG.  Patient's presentation is most consistent with acute presentation with potential threat to life or bodily function.  Independent interpretation of labs below.  Patient was observed in the emergency department without additional Narcan requirement.  Is awake and alert, and mental baseline.  Considered but no indication for inpatient admission at this time, she safe for outpatient management.  Did ask if she wants a prescription for Narcan, she declined.  Will have her follow-up primary care outpatient for follow-up.  Discharged with strict return precautions.    Clinical Course as of 10/07/24 2116  Sat Oct 07, 2024  8144 Independent review of labs, no leukocytosis, electrolytes not severely deranged, creatinine is normal. [TT]    Clinical Course User Index [TT] Waymond, Lorelle Cummins, MD     FINAL CLINICAL IMPRESSION(S) / ED DIAGNOSES   Final diagnoses:  Opiate overdose, accidental or unintentional, initial encounter [facility])     Rx / DC Orders   ED Discharge Orders     None        Note:  This document was prepared using Dragon voice recognition software and may include unintentional dictation errors.    Waymond Lorelle Cummins, MD 10/07/24 2116

## 2024-10-07 NOTE — ED Triage Notes (Signed)
 Pt to ED via [facility] from come for Overdose. Pt denies taking anything, but family states that she took fentanyl. EMS reports pt became unconscious. 1 mg of narcan given by EMS. 100 ml of NS. Pt AxO4 in triage.    80 HR 99 O2 134/78

## 2024-11-27 ENCOUNTER — Encounter: Payer: Self-pay | Admitting: *Deleted

## 2024-11-27 ENCOUNTER — Emergency Department
Admission: EM | Admit: 2024-11-27 | Discharge: 2024-11-28 | Disposition: A | Discharge status: Home / Self Care | Attending: Emergency Medicine | Admitting: Emergency Medicine

## 2024-11-27 ENCOUNTER — Other Ambulatory Visit: Payer: Self-pay

## 2024-11-27 DIAGNOSIS — F1990 Other psychoactive substance use, unspecified, uncomplicated: Secondary | ICD-10-CM

## 2024-11-27 DIAGNOSIS — F1910 Other psychoactive substance abuse, uncomplicated: Secondary | ICD-10-CM | POA: Insufficient documentation

## 2024-11-27 DIAGNOSIS — R45850 Homicidal ideations: Secondary | ICD-10-CM | POA: Diagnosis not present

## 2024-11-27 DIAGNOSIS — F419 Anxiety disorder, unspecified: Secondary | ICD-10-CM | POA: Diagnosis not present

## 2024-11-27 DIAGNOSIS — T7621XA Adult sexual abuse, suspected, initial encounter: Secondary | ICD-10-CM | POA: Diagnosis not present

## 2024-11-27 DIAGNOSIS — R441 Visual hallucinations: Secondary | ICD-10-CM | POA: Diagnosis not present

## 2024-11-27 DIAGNOSIS — F17210 Nicotine dependence, cigarettes, uncomplicated: Secondary | ICD-10-CM | POA: Diagnosis not present

## 2024-11-27 DIAGNOSIS — F32A Depression, unspecified: Secondary | ICD-10-CM | POA: Diagnosis not present

## 2024-11-27 LAB — CBC
HCT: 37.4 % (ref 36.0–46.0)
Hemoglobin: 11.6 g/dL — ABNORMAL LOW (ref 12.0–15.0)
MCH: 24.9 pg — ABNORMAL LOW (ref 26.0–34.0)
MCHC: 31 g/dL (ref 30.0–36.0)
MCV: 80.4 fL (ref 80.0–100.0)
Platelets: 538 K/uL — ABNORMAL HIGH (ref 150–400)
RBC: 4.65 MIL/uL (ref 3.87–5.11)
RDW: 16.5 % — ABNORMAL HIGH (ref 11.5–15.5)
WBC: 7.2 K/uL (ref 4.0–10.5)
nRBC: 0 % (ref 0.0–0.2)

## 2024-11-27 LAB — COMPREHENSIVE METABOLIC PANEL WITH GFR
ALT: 10 U/L (ref 0–44)
AST: 23 U/L (ref 15–41)
Albumin: 4.5 g/dL (ref 3.5–5.0)
Alkaline Phosphatase: 84 U/L (ref 38–126)
Anion gap: 11 (ref 5–15)
BUN: 6 mg/dL (ref 6–20)
CO2: 26 mmol/L (ref 22–32)
Calcium: 9.2 mg/dL (ref 8.9–10.3)
Chloride: 103 mmol/L (ref 98–111)
Creatinine, Ser: 0.65 mg/dL (ref 0.44–1.00)
GFR, Estimated: >60 mL/min
Glucose, Bld: 111 mg/dL — ABNORMAL HIGH (ref 70–99)
Potassium: 3.7 mmol/L (ref 3.5–5.1)
Sodium: 140 mmol/L (ref 135–145)
Total Bilirubin: <0.2 mg/dL (ref 0.0–1.2)
Total Protein: 7.7 g/dL (ref 6.5–8.1)

## 2024-11-27 LAB — ETHANOL: Alcohol, Ethyl (B): <15 mg/dL

## 2024-11-27 NOTE — ED Provider Notes (Signed)
 "  [facility] Provider Note   Event Date/Time   First MD Initiated Contact with Patient 11/27/24 2337     (approximate) History  Psychiatric Evaluation  HPI Shannon Patel is a 44 y.o. female with past medical history of bipolar disorder and unspecified schizophrenia as well as polysubstance abuse who presents complaining of homicidal ideation towards her husband.  Patient reports drug use and alcohol use tonight.  Patient states that she just wants to get away from her husband.  Patient was brought in by law enforcement not under IVC.  Patient states that she has been taking all medications on time and as prescribed.  Patient also endorses visual hallucinations of shadows in her peripheral vision. ROS: Patient currently denies any vision changes, tinnitus, difficulty speaking, facial droop, sore throat, chest pain, shortness of breath, abdominal pain, nausea/vomiting/diarrhea, dysuria, or weakness/numbness/paresthesias in any extremity   Physical Exam  Triage Vital Signs: ED Triage Vitals  Encounter Vitals Group     BP 11/27/24 2307 122/72     Girls Systolic BP Percentile --      Girls Diastolic BP Percentile --      Boys Systolic BP Percentile --      Boys Diastolic BP Percentile --      Pulse Rate 11/27/24 2307 (!) 111     Resp 11/27/24 2307 18     Temp 11/27/24 2307 98.2 F (36.8 C)     Temp Source 11/27/24 2307 Oral     SpO2 11/27/24 2307 98 %     Weight 11/27/24 2308 119 lb 0.8 oz (54 kg)     Height 11/27/24 2308 5' 3 (1.6 m)     Head Circumference --      Peak Flow --      Pain Score 11/27/24 2308 0     Pain Loc --      Pain Education --      Exclude from Growth Chart --    Most recent vital signs: Vitals:   11/27/24 2307  BP: 122/72  Pulse: (!) 111  Resp: 18  Temp: 98.2 F (36.8 C)  SpO2: 98%   General: Awake, oriented x4. CV:  Good peripheral perfusion. Resp:  Normal effort. Abd:  No distention. Other:  Middle-aged well-developed,  well-nourished Caucasian female resting comfortably in no acute distress ED Results / Procedures / Treatments  Labs (all labs ordered are listed, but only abnormal results are displayed) Labs Reviewed  COMPREHENSIVE METABOLIC PANEL WITH GFR - Abnormal; Notable for the following components:      Result Value   Glucose, Bld 111 (*)    All other components within normal limits  CBC - Abnormal; Notable for the following components:   Hemoglobin 11.6 (*)    MCH 24.9 (*)    RDW 16.5 (*)    Platelets 538 (*)    All other components within normal limits  ETHANOL  URINE DRUG SCREEN  POC URINE PREG, ED  PROCEDURES: Critical Care performed: No Procedures MEDICATIONS ORDERED IN ED: Medications - No data to display IMPRESSION / MDM / ASSESSMENT AND PLAN / ED COURSE  I reviewed the triage vital signs and the nursing notes.                             The patient is on the cardiac monitor to evaluate for evidence of arrhythmia and/or significant heart rate changes. Patient's presentation is most consistent with acute  presentation with potential threat to life or bodily function. Patient is a 44 year old female with above-stated past medical history who presents via law enforcement complaining of homicidal ideation towards her husband.  Patient also complains of visual hallucinations.  Patient admits to drug and alcohol abuse tonight. DDx: Drug-induced psychosis, psychosis related to schizophrenia, bipolar disorder, alcohol intoxication Plan: CBC, CMP, urine pregnancy, urine drug screen, ethanol, psychiatric consultation, TTS  Care of this patient will be signed out to the oncoming physician at the end of my shift.  All pertinent patient information conveyed and all questions answered.  All further care and disposition decisions will be made by the oncoming physician.   FINAL CLINICAL IMPRESSION(S) / ED DIAGNOSES   Final diagnoses:  None   Rx / DC Orders   ED Discharge Orders     None       Note:  This document was prepared using Dragon voice recognition software and may include unintentional dictation errors.   Euline Kimbler K, MD 11/28/24 [phone]  "

## 2024-11-27 NOTE — ED Triage Notes (Addendum)
 Pt brought in by [employer].  Pt states she doesn't want to be with her husband anymore.  Pt reports drug use.  Denies etoh use.  Pt states I just want to get out the situation I'm in.  Pt is Voluntary.   Pt cooperative.

## 2024-11-27 NOTE — ED Notes (Signed)
 Pink/gray sneakers Blue leggings Gray coat Black footies Tan pocketbook [employer] Yellow teeshirt White sports bra Stone necklace Multiple rings Navel ring

## 2024-11-28 DIAGNOSIS — T7621XA Adult sexual abuse, suspected, initial encounter: Secondary | ICD-10-CM

## 2024-11-28 DIAGNOSIS — F419 Anxiety disorder, unspecified: Secondary | ICD-10-CM

## 2024-11-28 DIAGNOSIS — F32A Depression, unspecified: Secondary | ICD-10-CM | POA: Diagnosis not present

## 2024-11-28 DIAGNOSIS — T7631XA Adult psychological abuse, suspected, initial encounter: Secondary | ICD-10-CM | POA: Diagnosis not present

## 2024-11-28 DIAGNOSIS — F1990 Other psychoactive substance use, unspecified, uncomplicated: Secondary | ICD-10-CM

## 2024-11-28 LAB — URINE DRUG SCREEN
Amphetamines: NEGATIVE
Barbiturates: NEGATIVE
Benzodiazepines: NEGATIVE
Cocaine: POSITIVE — AB
Fentanyl: NEGATIVE
Methadone Scn, Ur: NEGATIVE
Opiates: NEGATIVE
Tetrahydrocannabinol: NEGATIVE

## 2024-11-28 LAB — POC URINE PREG, ED: Preg Test, Ur: NEGATIVE

## 2024-11-28 MED ORDER — IBUPROFEN 600 MG PO TABS
600.0000 mg | ORAL_TABLET | Freq: Once | ORAL | Status: AC
  Administered 2024-11-28: 600 mg via ORAL
  Filled 2024-11-28: qty 1
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
## 2024-11-28 NOTE — Discharge Instructions (Addendum)
You have been seen in the emergency department for a  psychiatric concern. You have been evaluated both medically as well as psychiatrically. Please follow-up with your outpatient resources provided. Return to the emergency department for any worsening symptoms, or any thoughts of hurting yourself or anyone else so that we may attempt to help you. 

## 2024-11-28 NOTE — BH Assessment (Signed)
 This Clinical research associate contacted [facility] via phone to request an assessment, request has been made, assessment is currently pending

## 2024-11-28 NOTE — ED Notes (Signed)
Breakfast tray and juice provided  

## 2024-11-28 NOTE — ED Notes (Signed)
 Pt escorted to the interview room for pt evaluation

## 2024-11-28 NOTE — ED Notes (Signed)
 Pt safe for discharge at this time. Discharge instructions and recourses reviewed and pt verbalized understanding. Pt denies any questions. Pt ambulatory out of department with steady gait dressed in appropriate clothing. All belongings returned to pt.

## 2024-11-28 NOTE — ED Provider Notes (Signed)
----------------------------------------- °  11:01 AM on 11/28/2024 ----------------------------------------- Patient has been seen and evaluated by psychiatry.  They believe the patient safe for discharge home from a psychiatric standpoint.  Patient's medical workup has been largely nonrevealing besides cocaine positive urine drug screen.  We will discharge with outpatient resources.   Dorothyann Drivers, MD 11/28/24 1101
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
## 2024-11-28 NOTE — ED Notes (Signed)
VOL/Psych Consult ordered/Pending
